# Patient Record
Sex: Female | Born: 1955
Health system: Southern US, Community
[De-identification: ages and names within clinical notes are randomized; demographics above are authoritative.]

## PROBLEM LIST (undated history)

## (undated) DIAGNOSIS — F329 Major depressive disorder, single episode, unspecified: Secondary | ICD-10-CM

## (undated) DIAGNOSIS — F32A Depression, unspecified: Secondary | ICD-10-CM

## (undated) DIAGNOSIS — K219 Gastro-esophageal reflux disease without esophagitis: Secondary | ICD-10-CM

## (undated) DIAGNOSIS — T7840XA Allergy, unspecified, initial encounter: Secondary | ICD-10-CM

## (undated) DIAGNOSIS — E785 Hyperlipidemia, unspecified: Secondary | ICD-10-CM

## (undated) DIAGNOSIS — K5710 Diverticulosis of small intestine without perforation or abscess without bleeding: Secondary | ICD-10-CM

## (undated) DIAGNOSIS — K449 Diaphragmatic hernia without obstruction or gangrene: Secondary | ICD-10-CM

## (undated) HISTORY — DX: Allergy, unspecified, initial encounter: T78.40XA

## (undated) HISTORY — DX: Diverticulosis of small intestine without perforation or abscess without bleeding: K57.10

## (undated) HISTORY — DX: Major depressive disorder, single episode, unspecified: F32.9

## (undated) HISTORY — DX: Hyperlipidemia, unspecified: E78.5

## (undated) HISTORY — DX: Diaphragmatic hernia without obstruction or gangrene: K44.9

## (undated) HISTORY — DX: Gastro-esophageal reflux disease without esophagitis: K21.9

## (undated) HISTORY — PX: TUBAL LIGATION: SHX77

## (undated) HISTORY — DX: Depression, unspecified: F32.A

---

## 1999-10-11 ENCOUNTER — Ambulatory Visit (HOSPITAL_COMMUNITY): Admission: RE | Admit: 1999-10-11 | Discharge: 1999-10-11 | Payer: Self-pay | Admitting: Internal Medicine

## 1999-10-11 ENCOUNTER — Encounter: Payer: Self-pay | Admitting: Internal Medicine

## 2000-11-05 ENCOUNTER — Encounter: Payer: Self-pay | Admitting: Internal Medicine

## 2000-11-05 ENCOUNTER — Ambulatory Visit (HOSPITAL_COMMUNITY): Admission: RE | Admit: 2000-11-05 | Discharge: 2000-11-05 | Payer: Self-pay | Admitting: Internal Medicine

## 2001-07-31 ENCOUNTER — Other Ambulatory Visit: Admission: RE | Admit: 2001-07-31 | Discharge: 2001-07-31 | Payer: Self-pay | Admitting: Internal Medicine

## 2001-11-24 ENCOUNTER — Ambulatory Visit (HOSPITAL_COMMUNITY): Admission: RE | Admit: 2001-11-24 | Discharge: 2001-11-24 | Payer: Self-pay | Admitting: Internal Medicine

## 2001-11-24 ENCOUNTER — Encounter: Payer: Self-pay | Admitting: Internal Medicine

## 2002-11-11 ENCOUNTER — Other Ambulatory Visit: Admission: RE | Admit: 2002-11-11 | Discharge: 2002-11-11 | Payer: Self-pay | Admitting: Internal Medicine

## 2002-12-04 ENCOUNTER — Ambulatory Visit (HOSPITAL_COMMUNITY): Admission: RE | Admit: 2002-12-04 | Discharge: 2002-12-04 | Payer: Self-pay | Admitting: Internal Medicine

## 2002-12-04 ENCOUNTER — Encounter: Payer: Self-pay | Admitting: Internal Medicine

## 2003-12-02 ENCOUNTER — Other Ambulatory Visit: Admission: RE | Admit: 2003-12-02 | Discharge: 2003-12-02 | Payer: Self-pay | Admitting: Internal Medicine

## 2003-12-13 ENCOUNTER — Ambulatory Visit (HOSPITAL_COMMUNITY): Admission: RE | Admit: 2003-12-13 | Discharge: 2003-12-13 | Payer: Self-pay | Admitting: Internal Medicine

## 2005-01-08 ENCOUNTER — Other Ambulatory Visit: Admission: RE | Admit: 2005-01-08 | Discharge: 2005-01-08 | Payer: Self-pay | Admitting: Internal Medicine

## 2005-01-10 ENCOUNTER — Ambulatory Visit (HOSPITAL_COMMUNITY): Admission: RE | Admit: 2005-01-10 | Discharge: 2005-01-10 | Payer: Self-pay | Admitting: Internal Medicine

## 2006-01-22 ENCOUNTER — Other Ambulatory Visit: Admission: RE | Admit: 2006-01-22 | Discharge: 2006-01-22 | Payer: Self-pay | Admitting: Internal Medicine

## 2006-03-15 ENCOUNTER — Ambulatory Visit (HOSPITAL_COMMUNITY): Admission: RE | Admit: 2006-03-15 | Discharge: 2006-03-15 | Payer: Self-pay | Admitting: Internal Medicine

## 2007-01-29 ENCOUNTER — Other Ambulatory Visit: Admission: RE | Admit: 2007-01-29 | Discharge: 2007-01-29 | Payer: Self-pay | Admitting: Gynecology

## 2007-02-14 ENCOUNTER — Encounter: Admission: RE | Admit: 2007-02-14 | Discharge: 2007-02-14 | Payer: Self-pay | Admitting: Internal Medicine

## 2007-03-17 ENCOUNTER — Ambulatory Visit (HOSPITAL_COMMUNITY): Admission: RE | Admit: 2007-03-17 | Discharge: 2007-03-17 | Payer: Self-pay | Admitting: Internal Medicine

## 2007-08-25 ENCOUNTER — Ambulatory Visit: Payer: Self-pay | Admitting: Gastroenterology

## 2007-09-08 ENCOUNTER — Ambulatory Visit: Payer: Self-pay | Admitting: Gastroenterology

## 2008-05-03 ENCOUNTER — Ambulatory Visit (HOSPITAL_COMMUNITY): Admission: RE | Admit: 2008-05-03 | Discharge: 2008-05-03 | Payer: Self-pay | Admitting: Family Medicine

## 2008-06-01 LAB — HM COLONOSCOPY: HM Colonoscopy: NORMAL

## 2008-12-08 ENCOUNTER — Ambulatory Visit: Payer: Self-pay | Admitting: Internal Medicine

## 2008-12-08 DIAGNOSIS — K219 Gastro-esophageal reflux disease without esophagitis: Secondary | ICD-10-CM | POA: Insufficient documentation

## 2008-12-08 DIAGNOSIS — M810 Age-related osteoporosis without current pathological fracture: Secondary | ICD-10-CM | POA: Insufficient documentation

## 2008-12-08 DIAGNOSIS — F329 Major depressive disorder, single episode, unspecified: Secondary | ICD-10-CM | POA: Insufficient documentation

## 2008-12-08 DIAGNOSIS — J45909 Unspecified asthma, uncomplicated: Secondary | ICD-10-CM | POA: Insufficient documentation

## 2008-12-08 LAB — HM DEXA SCAN

## 2008-12-24 ENCOUNTER — Encounter: Payer: Self-pay | Admitting: Internal Medicine

## 2009-01-03 ENCOUNTER — Ambulatory Visit: Payer: Self-pay | Admitting: Internal Medicine

## 2009-01-03 LAB — CONVERTED CEMR LAB
ALT: 15 units/L (ref 0–35)
AST: 18 units/L (ref 0–37)
Albumin: 3.7 g/dL (ref 3.5–5.2)
Alkaline Phosphatase: 68 units/L (ref 39–117)
BUN: 15 mg/dL (ref 6–23)
Basophils Absolute: 0 10*3/uL (ref 0.0–0.1)
Basophils Relative: 0.6 % (ref 0.0–3.0)
Bilirubin, Direct: 0.1 mg/dL (ref 0.0–0.3)
CO2: 28 meq/L (ref 19–32)
Calcium: 9 mg/dL (ref 8.4–10.5)
Chloride: 109 meq/L (ref 96–112)
Cholesterol: 219 mg/dL — ABNORMAL HIGH (ref 0–200)
Creatinine, Ser: 0.6 mg/dL (ref 0.4–1.2)
Direct LDL: 126.5 mg/dL
Eosinophils Absolute: 0.2 10*3/uL (ref 0.0–0.7)
Eosinophils Relative: 4.6 % (ref 0.0–5.0)
GFR calc non Af Amer: 111.46 mL/min (ref >60)
Glucose, Bld: 98 mg/dL (ref 70–99)
HCT: 37.7 % (ref 36.0–46.0)
HDL: 48.4 mg/dL (ref >39.00)
Hemoglobin: 12.8 g/dL (ref 12.0–15.0)
Lymphocytes Relative: 37.8 % (ref 12.0–46.0)
Lymphs Abs: 1.2 10*3/uL (ref 0.7–4.0)
MCHC: 33.9 g/dL (ref 30.0–36.0)
MCV: 88.1 fL (ref 78.0–100.0)
Magnesium: 2.3 mg/dL (ref 1.5–2.5)
Monocytes Absolute: 0.3 10*3/uL (ref 0.1–1.0)
Monocytes Relative: 8 % (ref 3.0–12.0)
Neutro Abs: 1.6 10*3/uL (ref 1.4–7.7)
Neutrophils Relative %: 49 % (ref 43.0–77.0)
Phosphorus: 3.9 mg/dL (ref 2.3–4.6)
Platelets: 243 10*3/uL (ref 150.0–400.0)
Potassium: 4.3 meq/L (ref 3.5–5.1)
RBC: 4.27 M/uL (ref 3.87–5.11)
RDW: 12.8 % (ref 11.5–14.6)
Sodium: 143 meq/L (ref 135–145)
T3, Free: 3 pg/mL (ref 2.3–4.2)
TSH: 0.51 microintl units/mL (ref 0.35–5.50)
Total Bilirubin: 0.7 mg/dL (ref 0.3–1.2)
Total CHOL/HDL Ratio: 5
Total Protein: 6.8 g/dL (ref 6.0–8.3)
Triglycerides: 125 mg/dL (ref 0.0–149.0)
VLDL: 25 mg/dL (ref 0.0–40.0)
WBC: 3.3 10*3/uL — ABNORMAL LOW (ref 4.5–10.5)

## 2009-01-14 ENCOUNTER — Ambulatory Visit (HOSPITAL_COMMUNITY): Admission: RE | Admit: 2009-01-14 | Discharge: 2009-01-14 | Payer: Self-pay | Admitting: Internal Medicine

## 2009-02-10 ENCOUNTER — Ambulatory Visit: Payer: Self-pay | Admitting: Internal Medicine

## 2009-05-05 ENCOUNTER — Encounter: Admission: RE | Admit: 2009-05-05 | Discharge: 2009-05-05 | Payer: Self-pay | Admitting: Family Medicine

## 2009-07-21 ENCOUNTER — Encounter: Admission: RE | Admit: 2009-07-21 | Discharge: 2009-07-21 | Payer: Self-pay | Admitting: Orthopedic Surgery

## 2009-07-26 LAB — CONVERTED CEMR LAB: Pap Smear: NORMAL

## 2009-08-19 ENCOUNTER — Ambulatory Visit: Payer: Self-pay | Admitting: Internal Medicine

## 2009-08-19 DIAGNOSIS — J069 Acute upper respiratory infection, unspecified: Secondary | ICD-10-CM | POA: Insufficient documentation

## 2009-10-31 ENCOUNTER — Encounter: Admission: RE | Admit: 2009-10-31 | Discharge: 2009-10-31 | Payer: Self-pay | Admitting: Chiropractic Medicine

## 2010-02-13 ENCOUNTER — Ambulatory Visit: Payer: Self-pay | Admitting: Internal Medicine

## 2010-02-13 DIAGNOSIS — R1084 Generalized abdominal pain: Secondary | ICD-10-CM | POA: Insufficient documentation

## 2010-02-13 DIAGNOSIS — R5381 Other malaise: Secondary | ICD-10-CM | POA: Insufficient documentation

## 2010-02-13 DIAGNOSIS — R5383 Other fatigue: Secondary | ICD-10-CM

## 2010-02-13 LAB — CONVERTED CEMR LAB
ALT: 16 units/L (ref 0–35)
AST: 17 units/L (ref 0–37)
Albumin: 4.5 g/dL (ref 3.5–5.2)
Alkaline Phosphatase: 52 units/L (ref 39–117)
BUN: 15 mg/dL (ref 6–23)
Basophils Absolute: 0 10*3/uL (ref 0.0–0.1)
Basophils Relative: 0 % (ref 0–1)
Bilirubin, Direct: 0.1 mg/dL (ref 0.0–0.3)
CO2: 25 meq/L (ref 19–32)
Calcium: 9.7 mg/dL (ref 8.4–10.5)
Chloride: 105 meq/L (ref 96–112)
Creatinine, Ser: 0.72 mg/dL (ref 0.40–1.20)
Eosinophils Absolute: 0.1 10*3/uL (ref 0.0–0.7)
Eosinophils Relative: 2 % (ref 0–5)
Free T4: 1.19 ng/dL (ref 0.80–1.80)
Glucose, Bld: 106 mg/dL — ABNORMAL HIGH (ref 70–99)
HCT: 39.6 % (ref 36.0–46.0)
Hemoglobin: 12.9 g/dL (ref 12.0–15.0)
Indirect Bilirubin: 0.3 mg/dL (ref 0.0–0.9)
Lymphocytes Relative: 29 % (ref 12–46)
Lymphs Abs: 1.7 10*3/uL (ref 0.7–4.0)
MCHC: 32.6 g/dL (ref 30.0–36.0)
MCV: 89.4 fL (ref 78.0–100.0)
Monocytes Absolute: 0.4 10*3/uL (ref 0.1–1.0)
Monocytes Relative: 7 % (ref 3–12)
Neutro Abs: 3.7 10*3/uL (ref 1.7–7.7)
Neutrophils Relative %: 63 % (ref 43–77)
Platelets: 314 10*3/uL (ref 150–400)
Potassium: 4.8 meq/L (ref 3.5–5.3)
RBC: 4.43 M/uL (ref 3.87–5.11)
RDW: 13.1 % (ref 11.5–15.5)
Sodium: 143 meq/L (ref 135–145)
TSH: 0.716 microintl units/mL (ref 0.350–4.500)
Total Bilirubin: 0.4 mg/dL (ref 0.3–1.2)
Total Protein: 7.4 g/dL (ref 6.0–8.3)
WBC: 5.8 10*3/uL (ref 4.0–10.5)

## 2010-02-14 ENCOUNTER — Encounter: Payer: Self-pay | Admitting: Internal Medicine

## 2010-03-09 ENCOUNTER — Ambulatory Visit: Payer: Self-pay | Admitting: Internal Medicine

## 2010-06-15 ENCOUNTER — Encounter: Payer: Self-pay | Admitting: Internal Medicine

## 2010-06-16 ENCOUNTER — Encounter: Payer: Self-pay | Admitting: Internal Medicine

## 2010-10-01 ENCOUNTER — Encounter: Payer: Self-pay | Admitting: Internal Medicine

## 2010-10-08 LAB — CONVERTED CEMR LAB: Pap Smear: NORMAL

## 2010-10-12 NOTE — Miscellaneous (Signed)
Summary: BONE DENSITY  Clinical Lists Changes  Orders: Added new Test order of T-Bone Densitometry (77080) - Signed Added new Test order of T-Lumbar Vertebral Assessment (77082) - Signed 

## 2010-10-12 NOTE — Letter (Signed)
   [place] at [facility] [address] [place], [place]  [address]  [place] Phone: [phone]      December 24, 2008   [facility] [place] [address] [place], [place] [address]  RE:  LAB RESULTS  Dear  Ms. Mankins,  The following is an interpretation of your most recent lab tests.  Please take note of any instructions provided or changes to medications that have resulted from your lab work.  Your bone density scan confirms osteoporosis.   Please call my office to arrange ReClast infusion (IV bisphosphonate).     Sincerely Yours,    Dr. Thomos Lemons

## 2010-10-12 NOTE — Letter (Signed)
   [place] at [facility] [address] [place], [place]  [address]  [place] Phone: [phone]      February 14, 2010   [facility] [place] [address] [place], [place] [address]  RE:  LAB RESULTS  Dear  Ms. Carley,  The following is an interpretation of your most recent lab tests.  Please take note of any instructions provided or changes to medications that have resulted from your lab work.  ELECTROLYTES:  Good - no changes needed  KIDNEY FUNCTION TESTS:  Good - no changes needed  LIVER FUNCTION TESTS:  Good - no changes needed   THYROID STUDIES:  Thyroid studies normal TSH: 0.716     CBC:  Good - no changes needed       Sincerely Yours,    Dr. Thomos Lemons

## 2010-10-12 NOTE — Assessment & Plan Note (Signed)
Summary: 2 months rov-ch   Vital Signs:  Patient profile:   55 year old female Height:      60 inches Weight:      115 pounds Temp:     98.2 degrees F oral Pulse rate:   661 / minute BP sitting:   120 / 78  (left arm)  Vitals Entered By: Jeremy Johann CMA (February 10, 2009 8:09 AM) CC: 2 month   Primary Care Provider:  Dondra Spry DO  CC:  2 month.  History of Present Illness: 55 y/o Asian female for follow up re:  adj d/o with depression and osteoporosis.  Depression - doing well asymptomatic.     No significant life stressors.     Osteoporosis - received ReClast infusion.   She exp flu like symptoms x 2 days but resolved.    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   Neck:  No deformities, masses, or tenderness noted. Lungs:  normal respiratory effort, normal breath sounds, and no wheezes.   Heart:  normal rate, regular rhythm, no murmur, and no gallop.   Psych:  normally interactive, good eye contact, not anxious appearing, and not depressed appearing.     Impression & Recommendations:  Problem # 1:  DEPRESSION (ICD-311) Pt given instruction to taper lexapro over 1 month.   We discussed using 1/2 of clonazepam. Her updated medication list for this problem includes:    Lexapro 10 Mg Tabs (Escitalopram oxalate) .[name] Kitchen... Take 1 tablet by mouth once a day    Clonazepam 0.5 Mg Tabs (Clonazepam) .[name] Kitchen... Take 1 tablet by mouth once a day as needed  Problem # 2:  OSTEOPOROSIS (ICD-733.00) She received ReClast infusion.  She has flu like symptoms x 3 days but resolved.    Complete Medication List: 1)  Pantoprazole Sodium 40 Mg Tbec (Pantoprazole sodium) .... Take 1 tablet by mouth once a day 2)  Lexapro 10 Mg Tabs (Escitalopram oxalate) ....  Take 1 tablet by mouth once a day 3)  Clonazepam 0.5 Mg Tabs (Clonazepam) .... Take 1 tablet by mouth once a day as needed 4)  Epipen 0.3 Mg/0.24ml (1:1000) Devi (Epinephrine hcl (anaphylaxis)) .... Use as needed 5)  Symbicort 80-4.5 Mcg/act Aero (Budesonide-formoterol fumarate) .... 2 puffs bid  Patient Instructions: 1)  Please schedule a follow-up appointment in 4 months.

## 2010-10-12 NOTE — Assessment & Plan Note (Signed)
Summary: ? Sinus infection, sore throat- jr   Vital Signs:  Patient profile:   55 year old female Weight:      121.50 pounds BMI:     23.81 O2 Sat:      99 % on Room air Temp:     97.9 degrees F oral Pulse rate:   84 / minute Pulse rhythm:   regular Resp:     18 per minute BP sitting:   120 / 80  (right arm) Cuff size:   regular  Vitals Entered By: Glendell Docker CMA (August 19, 2009 8:21 AM)  O2 Flow:  Room air  Primary Care Provider:  D. Thomos Lemons DO  CC:  Medication Follow up and URI symptoms.  History of Present Illness:  URI Symptoms      This is a 55 year old woman who presents with URI symptoms.  The patient reports nasal congestion, clear nasal discharge, sore throat, and earache.  The patient denies fever.  Onset of symptoms 2-3 days.  Anxiety/Depression - hasn't tapered lexapro completely.  she is taking every other day.   uses clonazepam as needed.  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   Ears:  R ear normal and L ear normal.   Mouth:  pharyngeal erythema and postnasal drip.   Neck:  No deformities, masses, or tenderness noted. Lungs:  normal respiratory effort, normal breath sounds, and no wheezes.   Heart:  normal rate, regular rhythm, and no gallop.     Impression & Recommendations:  Problem # 1:  URI (ICD-465.9)  Instructed on symptomatic treatment. Call if symptoms persist or worsen.  Use warm salt water gargle and nasal saline irrigation.   Problem # 2:  DEPRESSION (ICD-311) Assessment: Unchanged stable.  hasn't tapered lexapro completely.  she is taking every other day.   uses clonazepam as needed. Her updated  medication list for this problem includes:    Lexapro 10 Mg Tabs (Escitalopram oxalate) .[name] Kitchen... Take 1 tablet by mouth once a day    Clonazepam 0.5 Mg Tabs (Clonazepam) .[name] Kitchen... Take 1 tablet by mouth once a day as needed  Complete Medication List: 1)  Pantoprazole Sodium 40 Mg Tbec (Pantoprazole sodium) .... Take 1 tablet by mouth once a day 2)  Lexapro 10 Mg Tabs (Escitalopram oxalate) .... Take 1 tablet by mouth once a day 3)  Clonazepam 0.5 Mg Tabs (Clonazepam) .... Take 1 tablet by mouth once a day as needed 4)  Epipen 0.3 Mg/0.50ml (1:1000) Devi (Epinephrine hcl (anaphylaxis)) .... Use as needed 5)  Symbicort 80-4.5 Mcg/act Aero (Budesonide-formoterol fumarate) .... 2 puffs bid  Patient Instructions: 1)  Please schedule a follow-up appointment in 6 months. 2)  Call our office if your symptoms do not  improve or gets worse. 3)  Use Lloyd Huger Med sinus irrigation Prescriptions: CLONAZEPAM 0.5 MG TABS (CLONAZEPAM) Take 1 tablet by mouth once a day as needed  #90 x 1   Entered and Authorized by:   D. Thomos Lemons DO   Signed by:   D. Thomos Lemons DO on 08/19/2009   Method used:   Print then Give to Patient   RxID:   [id number]  Immunization History:  Influenza Immunization History:    Influenza:  historical (07/05/2009)    Preventive Care Screening  Pap Smear:    Date:  07/26/2009    Results:  normal   Last Flu Shot:    Date:  07/05/2009    Results:  Historical   Mammogram:    Date:  06/28/2009    Results:  normal    Current Allergies (reviewed today): No known allergies   Appended Document: Orders Update    Clinical Lists Changes  Orders: Added new Service order of Rapid Strep [phone]) - Signed Observations: Added new observation of RAPID STREP: negative (08/19/2009 12:01)      Laboratory Results    Other Tests  Rapid Strep: negative

## 2010-10-12 NOTE — Assessment & Plan Note (Signed)
Summary: tired cant sleep feels bad/mhf   Vital Signs:  Patient profile:   55 year old female Weight:      117.75 pounds BMI:     23.08 O2 Sat:      100 % on Room air Temp:     97.9 degrees F oral Pulse rate:   76 / minute Pulse rhythm:   regular Resp:     16 per minute BP sitting:   120 / 80  (left arm) Cuff size:   regular  Vitals Entered By: Glendell Docker CMA (February 13, 2010 3:54 PM)  O2 Flow:  Room air CC: Rm 3-  Stress   Primary Care Provider:  Dondra Spry DO  CC:  Rm 3-  Stress.  History of Present Illness: work related stress, trouble falling and staying asleep, dizzy and light headed , hot & cold sweats, concerned with increase in heartburn. She states she was on vacation about 2 weeks ago , she had an episode of bad stomach cramping, which has resolved0  1 benardryl at night and one clonazepam at night  constantly exhausted.  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   Lungs:  normal respiratory effort and normal breath sounds.   Heart:  normal rate, regular rhythm, and no gallop.   Abdomen:  soft, non-tender, and normal bowel sounds.   Psych:  normally interactive, good eye contact, and not depressed appearing.     Impression & Recommendations:  Problem # 1:  ABDOMINAL PAIN (ICD-789.00) abd pain of unclear etiology.  transient gastroenteritis or related to stress rxn.   take otc zantac as needed  Orders: T-Basic Metabolic Panel [id number]) T-Hepatic Function [id number]) T-CBC w/Diff [phone])  Problem # 2:  DEPRESSION (ICD-311) I suspect sleep issues exacerbated by anxiety/ depression.  restart lexapro  The following  medications were removed from the medication list:    Lexapro 10 Mg Tabs (Escitalopram oxalate) .[name] Kitchen... Take 1 tablet by mouth once a day Her updated medication list for this problem includes:    Clonazepam 0.5 Mg Tabs (Clonazepam) .[name] Kitchen... Take 1 tablet by mouth once a day as needed    Lexapro 10 Mg Tabs (Escitalopram oxalate) ..... One half tab by mouth once daily x 7 days, then one by mouth qd  Complete Medication List: 1)  Pantoprazole Sodium 40 Mg Tbec (Pantoprazole sodium) .... Take 1 tablet by mouth once a day 2)  Clonazepam 0.5 Mg Tabs (Clonazepam) .... Take 1 tablet by mouth once a day as needed 3)  Epipen 0.3 Mg/0.25ml (1:1000) Devi (Epinephrine hcl (anaphylaxis)) .... Use as needed 4)  Lexapro 10 Mg Tabs (Escitalopram oxalate) .... One half tab by mouth once daily x 7 days, then one by mouth qd 5)  Hyoscyamine Sulfate 0.125 Mg Tabs (Hyoscyamine sulfate) .... One by mouth three times a day as needed for abdominal cramps  Other Orders: T-TSH ([id number]) T-T4, Free [phone])  Patient Instructions: 1)  Please schedule a follow-up appointment in 1 month. Prescriptions: HYOSCYAMINE SULFATE 0.125 MG TABS (HYOSCYAMINE SULFATE) one by mouth three times a day as needed for abdominal cramps  #30 x 1   Entered and Authorized by:  Dondra Spry DO   Signed by:   D. Thomos Lemons DO on 02/13/2010   Method used:   Electronically to        [facility] (retail)       [address]       [place], [place]  [address]       Ph: [phone] or [phone]       Fax: [phone]   RxID:   [id number] LEXAPRO 10 MG TABS (ESCITALOPRAM OXALATE) one half tab by mouth once daily x 7 days, then one by mouth qd  #30 x 1   Entered and Authorized by:   D. Thomos Lemons DO   Signed by:   D. Thomos Lemons DO on 02/13/2010   Method used:   Samples Given   RxID:   [id number]   Current Allergies (reviewed today): No known allergies

## 2010-10-12 NOTE — Assessment & Plan Note (Signed)
Summary: TO EST/HEA   Vital Signs:  Patient profile:   55 year old female Height:      60 inches Weight:      114.25 pounds BMI:     22.39 Temp:     97.9 degrees F Pulse rate:   68 / minute BP sitting:   112 / 70  (right arm) Cuff size:   regular  Vitals Entered By: Glendell Docker CMA (December 08, 2008 8:53 AM)  Primary Care Bonna Steury:  Kaylee Spry DO   History of Present Illness: New Patient to establish care  55 y/o Asian female to establish primary care.   She has hx of situational depression and anxiety.  Her prev PCP started pt on SSRI since 2004-2005.  She reports stress due to divorse.   She has tried to stopping lexapro but she experienced anxiety.   She is also concerned about wt gain assoc with SSRI.  Hx of GERD - stable on protonix.  Pt drinks one of coffe per day.   She denies dysphagia.      Hx of asthma - stable.   Infrequent flares.  She uses maintenace inhaler regularly.  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   Physical Exam  General:  alert, well-developed, and well-nourished.   Head:  normocephalic and atraumatic.   Eyes:  vision grossly intact, pupils equal, pupils round, and pupils reactive to light.   Ears:  R ear normal and L ear normal.   Mouth:  Oral mucosa and oropharynx without lesions or exudates.  Teeth in good repair. Neck:  No deformities, masses, or tenderness noted. Lungs:  normal  respiratory effort, normal breath sounds, and no wheezes.   Heart:  normal rate, regular rhythm, no murmur, and no gallop.   Abdomen:  soft, non-tender, no hepatomegaly, and no splenomegaly.   Extremities:  No clubbing, cyanosis, edema   Neurologic:  cranial nerves II-XII intact and gait normal.   Psych:  normally interactive, good eye contact, not anxious appearing, and not depressed appearing.     Impression & Recommendations:  Problem # 1:  DEPRESSION (ICD-311) Lexapro started 2004-2005 after divorce.   She notes some wt gain.   We discussed potentially tapering off vs switch to SNRI.  Her updated medication list for this problem includes:    Lexapro 10 Mg Tabs (Escitalopram oxalate) .[name] Kitchen... Take 1 tablet by mouth once a day    Clonazepam 0.5 Mg Tabs (Clonazepam) .[name] Kitchen... Take 1 tablet by mouth once a day as needed  Problem # 2:  ASTHMA (ICD-493.90) Pt with mild asthma.   Continue maintenance inhaler.   Her updated medication list for this problem includes:    Symbicort  80-4.5 Mcg/act Aero (Budesonide-formoterol fumarate) .[name] Kitchen... 2 puffs bid  Problem # 3:  GERD (ICD-530.81) Stable.   Continue PPI.   Her updated medication list for this problem includes:    Pantoprazole Sodium 40 Mg Tbec (Pantoprazole sodium) .[name] Kitchen... Take 1 tablet by mouth once a day  Problem # 4:  OSTEOPOROSIS (ICD-733.00) Pt with hx if osteoporosis.  She was previously of forteo.   Intolerant of oral bisphosphonates due to GERD.   Obtain copy of previous DEXA.   Consider yearly IV ReClast.  Check vit D level.  Complete Medication List: 1)  Pantoprazole Sodium 40 Mg Tbec (Pantoprazole sodium) .... Take 1 tablet by mouth once a day 2)  Lexapro 10 Mg Tabs (Escitalopram oxalate) .... Take 1 tablet by mouth once a day 3)  Clonazepam 0.5 Mg Tabs (Clonazepam) .... Take 1 tablet by mouth once a day as needed 4)  Epipen 0.3 Mg/0.29ml (1:1000) Devi (Epinephrine hcl (anaphylaxis)) .... Use as needed 5)  Symbicort 80-4.5 Mcg/act  Aero (Budesonide-formoterol fumarate) .... 2 puffs bid  Patient Instructions: 1)  Please schedule a follow-up appointment in 2 months. 2)  BMP prior to visit, ICD-9: 311 3)  Hepatic Panel prior to visit, ICD-9: 311 4)  Lipid Panel prior to visit, ICD-9: 311 5)  TSH prior to visit, ICD-9: 311 6)  Free T4:  311 7)  CBC w/ Diff prior to visit, ICD-9: 311 8)  Vit D level:  733.00 9)  Please return for lab work one (1) week before your next appointment.  10)  Schedule DEXA at [facility] before next OV. Prescriptions: CLONAZEPAM 0.5 MG TABS (CLONAZEPAM) Take 1 tablet by mouth once a day as needed  #30 x 3   Entered and Authorized by:   D. Thomos Lemons DO   Signed by:   D. Thomos Lemons DO on 12/08/2008   Method used:   Print then Give to Patient   RxID:   [id number] LEXAPRO 10 MG TABS (ESCITALOPRAM OXALATE) Take 1 tablet by mouth once a day  #90 x 3   Entered and Authorized by:   D. Thomos Lemons DO   Signed by:   D. Thomos Lemons DO on 12/08/2008   Method used:   Electronically to        [facility]* (mail-order)             ,          Ph: [phone]       Fax: [phone]   RxID:   [id number] PANTOPRAZOLE SODIUM 40 MG TBEC (PANTOPRAZOLE SODIUM) Take 1 tablet by mouth once a day  #90 x 3   Entered and Authorized by:   D. Thomos Lemons DO   Signed by:   D. Thomos Lemons DO on 12/08/2008   Method used:   Electronically to        [facility]* (mail-order)             ,          Ph: [phone]       Fax: [phone]   RxID:   [id number] SYMBICORT 80-4.5 MCG/ACT AERO (BUDESONIDE-FORMOTEROL FUMARATE) 2 puffs bid  #3 x 3   Entered and Authorized by:   D. Thomos Lemons DO   Signed by:   D. Thomos Lemons DO on 12/08/2008   Method used:   Electronically to        [facility])             ,  Ph: [phone]       Fax: [phone]   RxID:   [id number]       Preventive Care Screening  Last Tetanus Booster:    Date:  06/07/2008    Results:  Tdap    Colonoscopy:    Date:  06/01/2008    Results:  normal   Pap Smear:    Date:  05/07/2008    Results:  normal   Mammogram:    Date:  05/03/2008    Results:  normal   Bone Density:    Date:  11/26/2005    Results:  abnormal std dev   Preventive Care Screening  Last Tetanus Booster:    Date:  06/07/2008    Results:  Tdap   Colonoscopy:    Date:  06/01/2008    Results:  normal   Pap Smear:    Date:  05/07/2008    Results:  normal   Mammogram:    Date:  05/03/2008    Results:  normal   Bone Density:    Date:  11/26/2005    Results:  abnormal std dev

## 2010-10-12 NOTE — Assessment & Plan Note (Signed)
Summary: 1 month f/u / tf,cma rsc with pt from bump/mhf   Vital Signs:  Patient profile:   55 year old female Weight:      115.75 pounds BMI:     22.69 O2 Sat:      99 % on Room air Temp:     98.0 degrees F oral Pulse rate:   69 / minute Pulse rhythm:   regular Resp:     16 per minute BP sitting:   104 / 70  (right arm) Cuff size:   regular  Vitals Entered By: Glendell Docker CMA (March 09, 2010 8:18 AM)  O2 Flow:  Room air CC: Rm 3- 1 Month follow up Is Patient Diabetic? No Comments no concerns, discuss cholesterol check, refill on Clonazepam and Lexapro, medications reviewed   Primary Care Provider:  DThomos Lemons DO  CC:  Rm 3- 1 Month follow up.  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   Lungs:  normal respiratory effort and normal breath sounds.   Heart:  normal rate, regular rhythm, and no gallop.   Psych:  normally interactive, good eye contact, not anxious appearing, and not depressed appearing.     Impression & Recommendations:  Problem # 1:  DEPRESSION (ICD-311) Assessment Improved  Her updated medication list for this problem includes:    Clonazepam 0.5 Mg Tabs (Clonazepam) .[name] Kitchen... Take 1 tablet by mouth once a day as needed    Lexapro 10 Mg Tabs (Escitalopram oxalate) ..... One by mouth once daily  Complete Medication List: 1)  Pantoprazole Sodium 40 Mg Tbec (Pantoprazole sodium) .... Take 1 tablet by mouth once a day 2)  Clonazepam 0.5 Mg Tabs (Clonazepam) .... Take 1 tablet by mouth once a day as needed 3)  Epipen 0.3 Mg/0.26ml (1:1000) Devi (Epinephrine hcl (anaphylaxis)) .... Use  as needed 4)  Lexapro 10 Mg Tabs (Escitalopram oxalate) .... One by mouth once daily 5)  Hyoscyamine Sulfate 0.125 Mg Tabs (Hyoscyamine sulfate) .... One by mouth three times a day as needed for abdominal cramps  Patient Instructions: 1)  Please schedule a follow-up appointment in 6 months. Prescriptions: PANTOPRAZOLE SODIUM 40 MG TBEC (PANTOPRAZOLE SODIUM) Take 1 tablet by mouth once a day  #90 x 1   Entered and Authorized by:   D. Thomos Lemons DO   Signed by:   D. Thomos Lemons DO on 03/09/2010   Method used:   Electronically to        [facility]* (retail)             ,          Ph: [phone]       Fax: [phone]   RxID:   [id number] CLONAZEPAM 0.5 MG TABS (CLONAZEPAM) Take 1 tablet by mouth once a day as needed  #90 x 1   Entered and Authorized by:   D. Thomos Lemons DO   Signed by:   D. Thomos Lemons DO on 03/09/2010   Method used:   Print then Give to Patient   RxID:   [id number] LEXAPRO 10 MG TABS (ESCITALOPRAM OXALATE) one by mouth once daily  #  90 x 1   Entered and Authorized by:   D. Thomos Lemons DO   Signed by:   D. Thomos Lemons DO on 03/09/2010   Method used:   Electronically to        [facility]* (retail)             ,          Ph: [phone]       Fax: [phone]   RxID:   [id number]   Current Allergies (reviewed today): No known allergies

## 2010-10-12 NOTE — Miscellaneous (Signed)
Summary: mammogram  Clinical Lists Changes  Observations: Added new observation of MAMMOGRAM: normal (06/15/2010 13:22)      Preventive Care Screening  Mammogram:    Date:  06/15/2010    Results:  normal

## 2010-10-12 NOTE — Letter (Signed)
   [place] at [facility] [address] [place], [place]  [address]  [place] Phone: [phone]      January 03, 2009   [facility] [address] [place], [place] [address]  RE:  LAB RESULTS  Dear  [name],  The following is an interpretation of your most recent lab tests.  Please take note of any instructions provided or changes to medications that have resulted from your lab work.  ELECTROLYTES:  Good - no changes needed  KIDNEY FUNCTION TESTS:  Good - no changes needed  LIPID PANEL:  Fair - review at your next visit Triglyceride: 125.0   Cholesterol: 219   HDL: 48.40   Chol/HDL%:  5  THYROID STUDIES:  Thyroid studies normal TSH: 0.51     CBC:  Stable - no changes needed   I will further discuss your lab results at your next follow up appointment.     Sincerely Yours,    Dr. Thomos Lemons

## 2010-10-20 ENCOUNTER — Telehealth: Payer: Self-pay | Admitting: Internal Medicine

## 2010-10-20 ENCOUNTER — Encounter: Payer: Self-pay | Admitting: Internal Medicine

## 2010-10-20 LAB — CONVERTED CEMR LAB
BUN: 11 mg/dL (ref 6–23)
Bacteria, UA: NONE SEEN
Basophils Absolute: 0 10*3/uL (ref 0.0–0.1)
Basophils Relative: 0 % (ref 0–1)
Bilirubin Urine: NEGATIVE
Blood, UA: NEGATIVE
CO2: 26 meq/L (ref 19–32)
CRP: 1.7 mg/dL — ABNORMAL HIGH (ref <0.6)
Calcium: 9.8 mg/dL (ref 8.4–10.5)
Casts: NONE SEEN /lpf
Chloride: 100 meq/L (ref 96–112)
Cholesterol: 237 mg/dL — ABNORMAL HIGH (ref 0–200)
Creatinine, Ser: 0.68 mg/dL (ref 0.40–1.20)
Crystals: NONE SEEN
Eosinophils Absolute: 0.2 10*3/uL (ref 0.0–0.7)
Eosinophils Relative: 4 % (ref 0–5)
Glucose, Bld: 94 mg/dL (ref 70–99)
HCT: 41.5 % (ref 36.0–46.0)
HDL: 60 mg/dL (ref >39)
Hemoglobin: 13.4 g/dL (ref 12.0–15.0)
Ketones, ur: NEGATIVE mg/dL
LDL Cholesterol: 143 mg/dL — ABNORMAL HIGH (ref 0–99)
Lymphocytes Relative: 29 % (ref 12–46)
Lymphs Abs: 1.5 10*3/uL (ref 0.7–4.0)
MCHC: 32.3 g/dL (ref 30.0–36.0)
MCV: 91.2 fL (ref 78.0–100.0)
Monocytes Absolute: 0.4 10*3/uL (ref 0.1–1.0)
Monocytes Relative: 8 % (ref 3–12)
Neutro Abs: 3.1 10*3/uL (ref 1.7–7.7)
Neutrophils Relative %: 60 % (ref 43–77)
Nitrite: NEGATIVE
Platelets: 277 10*3/uL (ref 150–400)
Potassium: 4.5 meq/L (ref 3.5–5.3)
Protein, ur: NEGATIVE mg/dL
RBC: 4.55 M/uL (ref 3.87–5.11)
RDW: 13.3 % (ref 11.5–15.5)
Sodium: 137 meq/L (ref 135–145)
Specific Gravity, Urine: 1.019 (ref 1.005–1.030)
Squamous Epithelial / LPF: NONE SEEN /lpf
TSH: 1.084 microintl units/mL (ref 0.350–4.500)
Total CHOL/HDL Ratio: 4
Triglycerides: 170 mg/dL — ABNORMAL HIGH (ref <150)
Urine Glucose: NEGATIVE mg/dL
Urobilinogen, UA: 0.2 (ref 0.0–1.0)
VLDL: 34 mg/dL (ref 0–40)
WBC: 5.2 10*3/uL (ref 4.0–10.5)
pH: 7.5 (ref 5.0–8.0)

## 2010-10-23 ENCOUNTER — Telehealth: Payer: Self-pay | Admitting: Internal Medicine

## 2010-10-26 ENCOUNTER — Encounter: Payer: Self-pay | Admitting: Internal Medicine

## 2010-10-26 ENCOUNTER — Encounter (INDEPENDENT_AMBULATORY_CARE_PROVIDER_SITE_OTHER): Payer: 59 | Admitting: Internal Medicine

## 2010-10-26 DIAGNOSIS — Z0000 Encounter for general adult medical examination without abnormal findings: Secondary | ICD-10-CM

## 2010-10-26 NOTE — Progress Notes (Signed)
Summary: need lab order  Phone Note Other Incoming   Caller: Katrina @ [facility] ext [phone] Summary of Call: Pt in lab now for blood work. Please advise what labs pt needs. Initial call taken by: Mervin Kung CMA Duncan Dull),  October 20, 2010 9:11 AM  Follow-up for Phone Call        BMP prior to visit, ICD-9:  v70 Lipid Panel prior to visit, ICD-9:  v70 TSH prior to visit, ICD-9: v70 High sensitivity CRP :  v70 cbc:  v70    Follow-up by: D. Thomos Lemons DO,  October 20, 2010 9:22 AM  Additional Follow-up for Phone Call Additional follow up Details #1::        patient presented to office very upset regarding her lab orders and appointment for physical exam.   She was provided the lab order to take to lab Additional Follow-up by: Glendell Docker CMA,  October 20, 2010 9:28 AM

## 2010-10-26 NOTE — Miscellaneous (Signed)
Summary: Orders Update  Clinical Lists Changes  Orders: Added new Test order of T-Urinalysis ([id number]) - Signed 

## 2010-11-01 NOTE — Progress Notes (Signed)
Summary: Lab Results  ---- Converted from flag ---- ---- 10/23/2010 8:51 AM, D. Thomos Lemons DO wrote: call pt  - is she having any urinary symptoms ------------------------------  Phone Note Outgoing Call   Call placed by: Glendell Docker CMA,  October 23, 2010 2:50 PM Call placed to: Patient Summary of Call: call placed to patient at [phone], she states that she does not  have any urinary problems. She is feeling fine. She is scheduled for CPX on Thursdy 10/26/2010. Initial call taken by: Glendell Docker CMA,  October 23, 2010 2:51 PM

## 2010-11-09 ENCOUNTER — Encounter: Payer: Self-pay | Admitting: Internal Medicine

## 2010-11-09 ENCOUNTER — Ambulatory Visit (HOSPITAL_COMMUNITY): Payer: 59 | Attending: Internal Medicine

## 2010-11-09 DIAGNOSIS — M810 Age-related osteoporosis without current pathological fracture: Secondary | ICD-10-CM | POA: Insufficient documentation

## 2010-11-16 NOTE — Assessment & Plan Note (Signed)
Summary: CPX/HEA   Vital Signs:  Patient profile:   55 year old female Menstrual status:  postmenopausal Height:      60 inches Weight:      124.25 pounds BMI:     24.35 O2 Sat:      99 % on Room air Temp:     97.8 degrees F oral Pulse rate:   87 / minute Resp:     18 per minute BP sitting:   110 / 80  (right arm) Cuff size:   regular  Vitals Entered By: Glendell Docker CMA (October 26, 2010 10:47 AM)  O2 Flow:  Room air CC: CPX Is Patient Diabetic? No Pain Assessment Patient in pain? no      Comments medication refills     Menstrual Status postmenopausal Last PAP Result normal   Primary Care Provider:  Dondra Spry DO  CC:  CPX.  History of Present Illness: 55 y/o female for routine cpx  FLU VAX          Every 12 months         07/05/2009  Historical Due Now  TD BOOSTER       Every 10 years          06/07/2008  Tdap       Due On: 06/07/2018  ZOSTAVAX         At Age 38 years                                Due On: 09/25/2016  COLONOSCOPY      Every 10 years          06/01/2008  normal     Due On: 06/01/2018  COLONNXTDUE      At Age 87 years                                Due Now  FLEX SIGMOID     Every 5 years                                  Due Now  HEMOCCULT        Every 12 months                                Due Now  MAMMOGRAM        Every 12 months         06/15/2010  normal     Due On: 06/16/2011  MAMMO DUE        At Age 87 years                                Due Now  PAP SMEAR        Every 12 months         07/26/2009  normal     Due Now  PAP DUE          At Age 87 years                                Due Now  CHOLESTEROL  Every 5 years           10/20/2010  237        Due On: 10/21/2015  pilates 2-4 x per week   GERD - taking protonix regularly  Preventive Screening-Counseling & Management  Alcohol-Tobacco     Alcohol drinks/day: 0     Smoking Status: never  Caffeine-Diet-Exercise     Caffeine use/day: 1 beverage daily     Does Patient  Exercise: yes     Times/week: 4  Allergies (verified): No Known Drug Allergies  Past History:  Past Medical History: Osteoporosis Asthma Depression    GERD      Family History: parents in early 29s - AW prostate ca - father mother - knee no early CAD, CVA no cancer  Social History: Caffeine use/day:  1 beverage daily  Review of Systems       The patient complains of weight gain.  The patient denies chest pain, syncope, dyspnea on exertion, abdominal pain, melena, hematochezia, severe indigestion/heartburn, and depression.         volunteering  (eating more fried chicken)  Physical Exam  General:  alert, well-developed, and well-nourished.   Head:  normocephalic and atraumatic.   Eyes:  pupils equal, pupils round, and pupils reactive to light.   Ears:  R ear normal and L ear normal.   Mouth:  pharynx pink and moist.   Neck:  No deformities, masses, or tenderness noted. Lungs:  normal respiratory effort and normal breath sounds.   Heart:  normal rate, regular rhythm, and no gallop.   Abdomen:  soft, non-tender, and normal bowel sounds.   Neurologic:  cranial nerves II-XII intact and gait normal.   Psych:  normally interactive, good eye contact, not anxious appearing, and not depressed appearing.     Impression & Recommendations:  Problem # 1:  HEALTH MAINTENANCE EXAM (ICD-V70.0) Reviewed adult health maintenance protocols. Pt counseled on diet and exercise.. low cholesterol diet handout provided  Orders: EKG w/ Interpretation (93000)  Mammogram: normal (06/15/2010) Pap smear: normal (07/26/2009) Colonoscopy: normal (06/01/2008) Bone Density: abnormal (11/26/2005) Td Booster: Tdap (06/07/2008)   Flu Vax: Historical (07/25/2010)   Chol: 237 (10/20/2010)   HDL: 60 (10/20/2010)   LDL: 143 (10/20/2010)   TG: 170 (10/20/2010) TSH: 1.084 (10/20/2010)     Problem # 2:  OSTEOPOROSIS (ICD-733.00)  Orders: Misc. Referral (Misc. Ref)  Problem # 3:  DEPRESSION  (ICD-311) Assessment: Unchanged  Her updated medication list for this problem includes:    Clonazepam 0.5 Mg Tabs (Clonazepam) .[name] Kitchen... Take 1 tablet by mouth once a day as needed    Lexapro 10 Mg Tabs (Escitalopram oxalate) ..... One by mouth once daily  Complete Medication List: 1)  Pantoprazole Sodium 40 Mg Tbec (Pantoprazole sodium) .... Take 1 tablet by mouth once a day 2)  Clonazepam 0.5 Mg Tabs (Clonazepam) .... Take 1 tablet by mouth once a day as needed 3)  Epipen 0.3 Mg/0.39ml (1:1000) Devi (Epinephrine hcl (anaphylaxis)) .... Use as needed 4)  Lexapro 10 Mg Tabs (Escitalopram oxalate) .... One by mouth once daily 5)  Hyoscyamine Sulfate 0.125 Mg Tabs (Hyoscyamine sulfate) .... One by mouth three times a day as needed for abdominal cramps  Patient Instructions: 1)  Please schedule a follow-up appointment in 6 months. 2)  Lipid Panel prior to visit, ICD-9:  272.4 3)  Please return for lab work one (1) week before your next appointment.  Prescriptions: PANTOPRAZOLE SODIUM 40 MG TBEC (PANTOPRAZOLE SODIUM) Take 1 tablet  by mouth once a day  #90 x 1   Entered and Authorized by:   D. Thomos Lemons DO   Signed by:   D. Thomos Lemons DO on 10/26/2010   Method used:   Electronically to        [facility]* (retail)             ,          Ph: [phone]       Fax: [phone]   RxID:   [id number] LEXAPRO 10 MG TABS (ESCITALOPRAM OXALATE) one by mouth once daily  #90 x 1   Entered and Authorized by:   D. Thomos Lemons DO   Signed by:   D. Thomos Lemons DO on 10/26/2010   Method used:   Electronically to        [facility]* (retail)             ,          Ph: [phone]       Fax: [phone]   RxID:   [id number] CLONAZEPAM 0.5 MG TABS (CLONAZEPAM) Take 1 tablet by mouth once a day as needed  #90 x 1   Entered and Authorized by:   D. Thomos Lemons DO   Signed by:   D. Thomos Lemons DO on 10/26/2010   Method used:   Print then Give to Patient   RxID:    [id number]    Orders Added: 1)  EKG w/ Interpretation [93000] 2)  Misc. Referral [Misc. Ref] 3)  Est. Patient 40-64 years [99396]   Immunization History:  Influenza Immunization History:    Influenza:  historical (07/25/2010)   Immunization History:  Influenza Immunization History:    Influenza:  Historical (07/25/2010)  Current Allergies (reviewed today): No known allergies

## 2010-11-21 NOTE — Op Note (Signed)
Summary: [facility]  [facility]   Imported By: Maryln Gottron 11/17/2010 15:35:17  _____________________________________________________________________  External Attachment:    Type:   Image     Comment:   External Document

## 2011-04-16 ENCOUNTER — Ambulatory Visit (INDEPENDENT_AMBULATORY_CARE_PROVIDER_SITE_OTHER): Payer: 59 | Admitting: Internal Medicine

## 2011-04-16 ENCOUNTER — Encounter: Payer: Self-pay | Admitting: Internal Medicine

## 2011-04-16 VITALS — BP 116/82 | HR 77 | Temp 98.1°F | Resp 16 | Ht 60.0 in | Wt 125.0 lb

## 2011-04-16 DIAGNOSIS — R5381 Other malaise: Secondary | ICD-10-CM

## 2011-04-16 DIAGNOSIS — R5383 Other fatigue: Secondary | ICD-10-CM

## 2011-04-16 DIAGNOSIS — G4700 Insomnia, unspecified: Secondary | ICD-10-CM

## 2011-04-16 DIAGNOSIS — R519 Headache, unspecified: Secondary | ICD-10-CM | POA: Insufficient documentation

## 2011-04-16 DIAGNOSIS — R51 Headache: Secondary | ICD-10-CM

## 2011-04-16 LAB — BASIC METABOLIC PANEL
BUN: 14 mg/dL (ref 6–23)
CO2: 28 mEq/L (ref 19–32)
Calcium: 9.5 mg/dL (ref 8.4–10.5)
Chloride: 105 mEq/L (ref 96–112)
Creat: 0.62 mg/dL (ref 0.50–1.10)
Glucose, Bld: 72 mg/dL (ref 70–99)
Potassium: 4.4 mEq/L (ref 3.5–5.3)
Sodium: 142 mEq/L (ref 135–145)

## 2011-04-16 LAB — HEPATIC FUNCTION PANEL
ALT: 15 U/L (ref 0–35)
AST: 19 U/L (ref 0–37)
Albumin: 4.3 g/dL (ref 3.5–5.2)
Alkaline Phosphatase: 54 U/L (ref 39–117)
Bilirubin, Direct: <0.1 mg/dL (ref 0.0–0.3)
Total Bilirubin: 0.3 mg/dL (ref 0.3–1.2)
Total Protein: 7.2 g/dL (ref 6.0–8.3)

## 2011-04-16 LAB — CBC WITH DIFFERENTIAL/PLATELET
Basophils Absolute: 0 10*3/uL (ref 0.0–0.1)
Basophils Relative: 1 % (ref 0–1)
Eosinophils Absolute: 0.1 10*3/uL (ref 0.0–0.7)
Eosinophils Relative: 3 % (ref 0–5)
HCT: 38.4 % (ref 36.0–46.0)
Hemoglobin: 12.7 g/dL (ref 12.0–15.0)
Lymphocytes Relative: 41 % (ref 12–46)
Lymphs Abs: 2.1 10*3/uL (ref 0.7–4.0)
MCH: 29.2 pg (ref 26.0–34.0)
MCHC: 33.1 g/dL (ref 30.0–36.0)
MCV: 88.3 fL (ref 78.0–100.0)
Monocytes Absolute: 0.4 10*3/uL (ref 0.1–1.0)
Monocytes Relative: 8 % (ref 3–12)
Neutro Abs: 2.5 10*3/uL (ref 1.7–7.7)
Neutrophils Relative %: 49 % (ref 43–77)
Platelets: 305 10*3/uL (ref 150–400)
RBC: 4.35 MIL/uL (ref 3.87–5.11)
RDW: 13 % (ref 11.5–15.5)
WBC: 5.2 10*3/uL (ref 4.0–10.5)

## 2011-04-16 LAB — TSH: TSH: 1.132 u[IU]/mL (ref 0.350–4.500)

## 2011-04-16 LAB — SEDIMENTATION RATE: Sed Rate: 36 mm/hr — ABNORMAL HIGH (ref 0–22)

## 2011-04-16 MED ORDER — ISOMETHEPTENE-APAP-DICHLORAL 65-325-100 MG PO CAPS: 1.0000 | ORAL_CAPSULE | ORAL | Status: DC | PRN

## 2011-04-16 MED ORDER — CLONAZEPAM 0.5 MG PO TABS: 0.5000 mg | ORAL_TABLET | Freq: Every evening | ORAL | Status: DC | PRN

## 2011-04-16 MED ORDER — KETOROLAC TROMETHAMINE 30 MG/ML IJ SOLN
30.0000 mg | Freq: Once | INTRAMUSCULAR | Status: AC
  Administered 2011-04-16: 30 mg via INTRAMUSCULAR

## 2011-04-16 NOTE — Assessment & Plan Note (Signed)
rf klonopin for prn sparing use. Aware of potential for addiction and/or tolerance

## 2011-04-16 NOTE — Assessment & Plan Note (Signed)
Obtain cbc, chem7, lft, tsh and esr.

## 2011-04-16 NOTE — Assessment & Plan Note (Signed)
Neurologically nonfocal. Given toradol 30mg  im. Begin midrin prn. followup if no improvement or worsening.

## 2011-04-16 NOTE — Progress Notes (Signed)
  Subjective:    Patient ID: Kaylee Pratt, female    DOB: [birth year], 55 y.o.   MRN: [id number]  HPI Pt presents to clinic for evaluation of headache. Notes 2wk h/o diffuse headache with nausea but no emesis. Denies neurologic sx such as numbness, weakness, visual disturbance.  Notes accompanying generalized fatigue and insomnia. Takes klonopin qhs prn without evidence of addiction or tolerance. Reviewed appropriate sleep hygiene. Has increased work stress but sx's were no better on vacation. No exacerbating or alleviating factors. No other complaints.  Reviewed pmh, medications and allergies.    Review of Systems see hpi     Objective:   Physical Exam  Nursing note and vitals reviewed. Constitutional: She appears well-developed and well-nourished. No distress.  HENT:  Head: Normocephalic and atraumatic.  Right Ear: External ear normal.  Left Ear: External ear normal.  Eyes: Conjunctivae and EOM are normal. Pupils are equal, round, and reactive to light.  Neck: Neck supple.  Neurological: She is alert. No cranial nerve deficit. Coordination normal.  Skin: Skin is warm and dry. She is not diaphoretic.  Psychiatric: She has a normal mood and affect.          Assessment & Plan:

## 2011-06-11 LAB — HM MAMMOGRAPHY: HM Mammogram: NORMAL

## 2011-07-17 ENCOUNTER — Encounter: Payer: Self-pay | Admitting: Internal Medicine

## 2011-07-30 ENCOUNTER — Other Ambulatory Visit: Payer: Self-pay | Admitting: Internal Medicine

## 2011-08-21 ENCOUNTER — Other Ambulatory Visit: Payer: Self-pay | Admitting: Internal Medicine

## 2011-08-22 ENCOUNTER — Other Ambulatory Visit: Payer: Self-pay | Admitting: Internal Medicine

## 2011-11-02 ENCOUNTER — Ambulatory Visit (INDEPENDENT_AMBULATORY_CARE_PROVIDER_SITE_OTHER): Payer: 59 | Admitting: Family

## 2011-11-02 ENCOUNTER — Other Ambulatory Visit (HOSPITAL_COMMUNITY)
Admission: RE | Admit: 2011-11-02 | Discharge: 2011-11-02 | Disposition: A | Discharge status: Home / Self Care | Payer: 59 | Source: Ambulatory Visit | Attending: Family | Admitting: Family

## 2011-11-02 ENCOUNTER — Telehealth: Payer: Self-pay | Admitting: *Deleted

## 2011-11-02 ENCOUNTER — Other Ambulatory Visit: Payer: Self-pay | Admitting: *Deleted

## 2011-11-02 ENCOUNTER — Ambulatory Visit (INDEPENDENT_AMBULATORY_CARE_PROVIDER_SITE_OTHER)
Admission: RE | Admit: 2011-11-02 | Discharge: 2011-11-02 | Disposition: A | Discharge status: Home / Self Care | Payer: 59 | Source: Ambulatory Visit

## 2011-11-02 ENCOUNTER — Encounter: Payer: Self-pay | Admitting: Family

## 2011-11-02 DIAGNOSIS — M810 Age-related osteoporosis without current pathological fracture: Secondary | ICD-10-CM

## 2011-11-02 DIAGNOSIS — Z01419 Encounter for gynecological examination (general) (routine) without abnormal findings: Secondary | ICD-10-CM | POA: Insufficient documentation

## 2011-11-02 DIAGNOSIS — Z0000 Encounter for general adult medical examination without abnormal findings: Secondary | ICD-10-CM

## 2011-11-02 DIAGNOSIS — N393 Stress incontinence (female) (male): Secondary | ICD-10-CM

## 2011-11-02 LAB — CBC WITH DIFFERENTIAL/PLATELET
Basophils Absolute: 0 10*3/uL (ref 0.0–0.1)
Basophils Relative: 0 % (ref 0–1)
Eosinophils Absolute: 0.1 10*3/uL (ref 0.0–0.7)
Eosinophils Relative: 3 % (ref 0–5)
HCT: 40.1 % (ref 36.0–46.0)
Hemoglobin: 12.8 g/dL (ref 12.0–15.0)
Lymphocytes Relative: 41 % (ref 12–46)
Lymphs Abs: 1.7 10*3/uL (ref 0.7–4.0)
MCH: 29 pg (ref 26.0–34.0)
MCHC: 31.9 g/dL (ref 30.0–36.0)
MCV: 90.9 fL (ref 78.0–100.0)
Monocytes Absolute: 0.4 10*3/uL (ref 0.1–1.0)
Monocytes Relative: 8 % (ref 3–12)
Neutro Abs: 2 10*3/uL (ref 1.7–7.7)
Neutrophils Relative %: 47 % (ref 43–77)
Platelets: 293 10*3/uL (ref 150–400)
RBC: 4.41 MIL/uL (ref 3.87–5.11)
RDW: 12.8 % (ref 11.5–15.5)
WBC: 4.2 10*3/uL (ref 4.0–10.5)

## 2011-11-02 LAB — BASIC METABOLIC PANEL WITH GFR
BUN: 13 mg/dL (ref 6–23)
CO2: 28 mEq/L (ref 19–32)
Calcium: 9.6 mg/dL (ref 8.4–10.5)
Chloride: 104 mEq/L (ref 96–112)
Creat: 0.68 mg/dL (ref 0.50–1.10)
GFR, Est African American: >89 mL/min
GFR, Est Non African American: >89 mL/min
Glucose, Bld: 91 mg/dL (ref 70–99)
Potassium: 4.6 mEq/L (ref 3.5–5.3)
Sodium: 141 mEq/L (ref 135–145)

## 2011-11-02 LAB — HEPATIC FUNCTION PANEL
ALT: 21 U/L (ref 0–35)
AST: 24 U/L (ref 0–37)
Albumin: 4.5 g/dL (ref 3.5–5.2)
Alkaline Phosphatase: 59 U/L (ref 39–117)
Bilirubin, Direct: 0.1 mg/dL (ref 0.0–0.3)
Indirect Bilirubin: 0.4 mg/dL (ref 0.0–0.9)
Total Bilirubin: 0.5 mg/dL (ref 0.3–1.2)
Total Protein: 7.4 g/dL (ref 6.0–8.3)

## 2011-11-02 LAB — LIPID PANEL
Cholesterol: 232 mg/dL — ABNORMAL HIGH (ref 0–200)
HDL: 51 mg/dL (ref >39)
LDL Cholesterol: 131 mg/dL — ABNORMAL HIGH (ref 0–99)
Total CHOL/HDL Ratio: 4.5 Ratio
Triglycerides: 251 mg/dL — ABNORMAL HIGH (ref <150)
VLDL: 50 mg/dL — ABNORMAL HIGH (ref 0–40)

## 2011-11-02 LAB — TSH: TSH: 1.061 u[IU]/mL (ref 0.350–4.500)

## 2011-11-02 MED ORDER — ISOMETHEPTENE-APAP-DICHLORAL 65-325-100 MG PO CAPS: 1.0000 | ORAL_CAPSULE | ORAL | Status: DC | PRN

## 2011-11-02 MED ORDER — ALBUTEROL SULFATE HFA 108 (90 BASE) MCG/ACT IN AERS: 2.0000 | INHALATION_SPRAY | Freq: Four times a day (QID) | RESPIRATORY_TRACT | Status: DC | PRN

## 2011-11-02 NOTE — Telephone Encounter (Signed)
Please advise 

## 2011-11-02 NOTE — Telephone Encounter (Signed)
Message copied by Kathi Simpers on Fri Nov 02, 2011  4:41 PM ------      Message from: O'SULLIVAN, MELISSA      Created: Fri Nov 02, 2011 10:41 AM       Could you please ask Dr. Rodena Medin to authorize a 90 day supply of clonazepam to [facility]?

## 2011-11-02 NOTE — Assessment & Plan Note (Addendum)
Pap performed today. Pt counseled on healthy diet, exercise and SBE.  Will refer for bone density. Obtain fasting laboratories.

## 2011-11-02 NOTE — Telephone Encounter (Signed)
Message copied by Kathi Simpers on Fri Nov 02, 2011  4:42 PM ------      Message from: O'SULLIVAN, MELISSA      Created: Fri Nov 02, 2011 10:41 AM       Could you please ask Dr. Rodena Medin to authorize a 90 day supply of clonazepam to [facility]?

## 2011-11-02 NOTE — Telephone Encounter (Signed)
ok 

## 2011-11-02 NOTE — Patient Instructions (Signed)
Please complete your blood work prior to leaving. Schedule your bone density at the front desk. Follow up as needed.

## 2011-11-02 NOTE — Progress Notes (Signed)
Subjective:    Patient ID: Kaylee Pratt, female    DOB: 06/07/1956, 56 y.o.   MRN: [id number]  HPI  Preventatve- [facility] 10/[facility].  Pt had pap smear 2/10. Would like to have done today.  She has not had bone density since 3/10. Up to date on colo.  Kaylee Pratt-  Reports daily headaches.  Denies migraine.  Requesting refill on midrin.   Review of Systems  Constitutional: Negative for unexpected weight change.  HENT: Negative for hearing loss.   Eyes: Negative for discharge and visual disturbance.  Respiratory: Negative for cough and shortness of breath.   Cardiovascular: Negative for chest pain.  Gastrointestinal: Negative for nausea, vomiting, diarrhea and blood in stool.  Genitourinary: Negative for dysuria, frequency and hematuria.  Musculoskeletal: Negative for myalgias and arthralgias.  Neurological: Positive for headaches.  Hematological: Negative for adenopathy.  Psychiatric/Behavioral:       Anxiety   Past Medical History  Diagnosis Date  . Osteoporosis   . Asthma   . Depression   . GERD (gastroesophageal reflux disease)     History   Social History  . Marital Status: Married    Spouse Name: N/A    Number of Children: 2  . Years of Education: N/A   Occupational History  .  Kaylee Pratt   Social History Main Topics  . Smoking status: Never Smoker   . Smokeless tobacco: Never Used  . Alcohol Use: Yes     wine on occasion  . Drug Use: No  . Sexually Active: Not on file   Other Topics Concern  . 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  Family History  Problem Relation Age of Onset  . Prostate cancer Father   . Other Neg Hx     no early CVA/CAD, no cancer    No Known Allergies  Current Outpatient Prescriptions on File Prior to Visit  Medication Sig Dispense Refill  . clonazePAM (KLONOPIN) 0.5  MG tablet Take 1 tablet (0.5 mg total) by mouth at bedtime as needed for anxiety.  90 tablet  0  . escitalopram (LEXAPRO) 10 MG tablet TAKE 1 TABLET DAILY  90 tablet  0  . pantoprazole (PROTONIX) 40 MG tablet TAKE 1 TABLET DAILY  90 tablet  0    BP 114/66  Pulse 69  Temp(Src) 97.7 F (36.5 C) (Oral)  Resp 16  Ht 5\' 1"  (1.549 m)  Wt 127 lb (57.607 kg)  BMI 24.00 kg/m2  SpO2 98%       Objective:   Physical Exam  Physical Exam  Constitutional: She is oriented to person, place, and time. She appears well-developed and well-nourished. No distress.  HENT:  Head: Normocephalic and atraumatic.  Right Ear: Tympanic membrane and ear canal normal.  Left Ear: Tympanic membrane and ear canal normal.  Mouth/Throat: Oropharynx is clear and moist.  Eyes: Pupils are equal, round, and reactive to light. No scleral icterus.  Neck: Normal range of motion. No thyromegaly present.  Cardiovascular: Normal rate and regular rhythm.   No murmur heard. Pulmonary/Chest: Effort normal and breath sounds normal. No respiratory distress. He has no wheezes. She has no rales. She exhibits no tenderness.  Abdominal: Soft. Bowel sounds are normal. He exhibits no distension and no mass. There is no tenderness. There is no  rebound and no guarding.  Musculoskeletal: She exhibits no edema.  Lymphadenopathy:    She has no cervical adenopathy.  Neurological: She is alert and oriented to person, place, and time. She has normal reflexes. She exhibits normal muscle tone. Coordination normal.  Skin: Skin is warm and dry.  Psychiatric: She has a normal mood and affect. Her behavior is normal. Judgment and thought content normal.  Breasts: Examined lying Right: Without masses, retractions, discharge or axillary adenopathy.  Left: Without masses, retractions, discharge or axillary adenopathy.  Inguinal/mons: Normal without inguinal adenopathy  External genitalia: Normal  BUS/Urethra/Skene's glands: Normal  Bladder:  Normal  Vagina: Normal  Cervix: Normal  Uterus: normal in size, shape and contour. Midline and mobile  Adnexa/parametria:  Rt: Without masses or tenderness.  Lt: Without masses or tenderness.  Anus and perineum: Normal           Assessment & Plan:         Assessment & Plan:

## 2011-11-03 LAB — VITAMIN D 25 HYDROXY (VIT D DEFICIENCY, FRACTURES): Vit D, 25-Hydroxy: 62 ng/mL (ref 30–89)

## 2011-11-05 ENCOUNTER — Telehealth: Payer: Self-pay | Admitting: Family

## 2011-11-05 MED ORDER — CLONAZEPAM 0.5 MG PO TABS: 0.5000 mg | ORAL_TABLET | Freq: Every evening | ORAL | Status: DC | PRN

## 2011-11-05 NOTE — Telephone Encounter (Signed)
Pt requested to pick up printed Rx. Rx printed and forwarded to Provider for signature.

## 2011-11-05 NOTE — Telephone Encounter (Signed)
Pls call pt and let her know that her lab work looks good- thyroid, blood count, liver function, kidney, sugar are all normal.  Her cholesterol is a bit elevated.  She should try to work on a low fat/low cholesterol diet and exercise.

## 2011-11-05 NOTE — Telephone Encounter (Signed)
Notified pt. She states she does not eat much and does not eat fried foods. States she eats a lot fruits.  Pt wants to know if she should start a cholesterol medication? Please advise.

## 2011-11-05 NOTE — Telephone Encounter (Signed)
Rx signed and pt notified. Rx placed at front desk for pick up.

## 2011-11-06 NOTE — Telephone Encounter (Signed)
Notified pt and she voices understanding. She states she will pick the diet sheet up from our office. Handout has been left at the front desk for pick up.

## 2011-11-06 NOTE — Telephone Encounter (Signed)
She can add fish oil as noted on med list.  Also, please mail her a copy of low cholesterol diet handout.

## 2011-11-07 ENCOUNTER — Encounter: Payer: Self-pay | Admitting: Family

## 2011-11-07 ENCOUNTER — Telehealth: Payer: Self-pay | Admitting: *Deleted

## 2011-11-07 NOTE — Telephone Encounter (Signed)
Received fax from [employer] for order of compression garments. Signed order faxed to [phone].

## 2011-11-08 ENCOUNTER — Telehealth: Payer: Self-pay | Admitting: *Deleted

## 2011-11-08 ENCOUNTER — Encounter: Payer: Self-pay | Admitting: *Deleted

## 2011-11-08 NOTE — Telephone Encounter (Signed)
Opened in error

## 2011-11-08 NOTE — Telephone Encounter (Signed)
Most recent colonoscopy result received and it was date 09/06/08. Report forwarded to Provider for review.

## 2011-11-08 NOTE — Telephone Encounter (Signed)
Spoke with Elroy Channel and requested below report.

## 2011-11-08 NOTE — Telephone Encounter (Signed)
Message copied by Kathi Simpers on Thu Nov 08, 2011  9:29 AM ------      Message from: O'SULLIVAN, MELISSA      Created: Fri Nov 02, 2011 10:25 AM       Could you pls request colo report from [facility] 2009?

## 2011-11-09 ENCOUNTER — Encounter: Payer: Self-pay | Admitting: Family

## 2011-11-15 ENCOUNTER — Telehealth: Payer: Self-pay | Admitting: *Deleted

## 2011-11-15 NOTE — Telephone Encounter (Signed)
Received call from pt stating the pharmacy told her that Midrin has been continued; verified this with pharmacy. Pt states she sees Dr Rodena Medin for primary care and only saw Melissa for pap smear (did not change providers). Please advise what pt can take for migraines?

## 2011-11-15 NOTE — Telephone Encounter (Signed)
Notified pt, she requests appt for Monday. Appt scheduled for 9:15.

## 2011-11-15 NOTE — Telephone Encounter (Signed)
i placed her on it 8/12 but haven't seen her since. For more aggressive medications probably need to see her

## 2011-11-19 ENCOUNTER — Other Ambulatory Visit: Payer: Self-pay | Admitting: Internal Medicine

## 2011-11-19 ENCOUNTER — Ambulatory Visit: Payer: 59 | Admitting: Internal Medicine

## 2011-11-19 ENCOUNTER — Encounter: Payer: Self-pay | Admitting: Gastroenterology

## 2011-11-19 NOTE — Telephone Encounter (Signed)
Refill sent to [facility], #90 x 1 refill.

## 2011-11-19 NOTE — Telephone Encounter (Signed)
Left message on pt's cell # to call and let me know if refill should go to mail order service.

## 2011-11-19 NOTE — Telephone Encounter (Signed)
Patient states that refill should go to mail order service- [facility]

## 2012-01-02 ENCOUNTER — Ambulatory Visit (INDEPENDENT_AMBULATORY_CARE_PROVIDER_SITE_OTHER): Payer: 59 | Admitting: Sports Medicine

## 2012-01-02 ENCOUNTER — Encounter: Payer: Self-pay | Admitting: Sports Medicine

## 2012-01-02 VITALS — BP 122/80 | HR 88 | Ht 60.0 in | Wt 122.0 lb

## 2012-01-02 DIAGNOSIS — M722 Plantar fascial fibromatosis: Secondary | ICD-10-CM | POA: Insufficient documentation

## 2012-01-02 DIAGNOSIS — M2170 Unequal limb length (acquired), unspecified site: Secondary | ICD-10-CM

## 2012-01-02 NOTE — Assessment & Plan Note (Addendum)
Partially corrected today with heal lift.  She should use lift in any shoes for walking or prolonged standing

## 2012-01-02 NOTE — Progress Notes (Signed)
Patient ID: SHARIYA GASTER, female   DOB: Aug 27, 1956, 55 y.o.   MRN: [id number] Patient presents today for left heel pain that has been going on for the past one to 2 months. It came with no specific provocation and has been gradually worsening. It is made worse by prolonged standing or walking. The patient has tried over-the-counter orthotics which have not provided significant relief. The patient also reports that after a significant period of standing or walking she gets some lateral lower leg pain, more so on the left than on the right.  She does pilates but this does not cause pain  Objective: Gen: Appropriate weight female, no distress Leg: 1 to 1.5 cm leg length discrepancy, left leg shorter than right Foot: High arch bilaterally.  No forefoot collapse.  Pain on palpation at the insertion of the plantar fascia on the left.  MSK [place]: Thickened plantar fascia (to 0.6cm) on the left with surrounding fluid.  PF on right is normal (0.35 cm).  Thickening on the left progresses to the first 3 to 4 cms of the PF.

## 2012-01-02 NOTE — Patient Instructions (Signed)
It was great to see you today! Please do the exercises we gave you two times per day. Place your foot in ice water for 5-10 minutes several times per day. Come back to see [place] in about 2 months so we can repeat your ultrasound and see if you are getting better.

## 2012-01-02 NOTE — Assessment & Plan Note (Signed)
Provided patient with sports insoles, left foot arch strap, and exercises for PF.  Will have patient RTC in 2 months to assess progress.  Would plan repeat US at that time.

## 2012-02-27 ENCOUNTER — Ambulatory Visit (INDEPENDENT_AMBULATORY_CARE_PROVIDER_SITE_OTHER): Payer: 59 | Admitting: Sports Medicine

## 2012-02-27 VITALS — BP 131/83

## 2012-02-27 DIAGNOSIS — M2170 Unequal limb length (acquired), unspecified site: Secondary | ICD-10-CM

## 2012-02-27 DIAGNOSIS — M722 Plantar fascial fibromatosis: Secondary | ICD-10-CM

## 2012-02-27 NOTE — Assessment & Plan Note (Addendum)
Steroid injection given today into left medial PF.  Pt given instructions on postinjection care.  Pt encouraged to continue PF exercises and ice daily.   Pt to return as needed for follow up in 6 to 8 wks  Consent obtained and verified. Sterileprep. Furthur cleansed with alcohol. Topical analgesic spray: Ethyl chloride. Joint: left PF Approached in typical fashion with: spray of medial heel  And then direct injection above PF membrane into clear space Completed without difficulty Meds: 1 cc kenalog 10 + 1.5 cc lidocaine 1% Needle:25 g and 1.5 in Aftercare instructions and Red flags advised.  Cont on previous exercise and stretch protocol

## 2012-02-27 NOTE — Assessment & Plan Note (Signed)
Given heel lifts for other left shoes to see if we can make her have less pressure on left heel

## 2012-02-27 NOTE — Progress Notes (Signed)
  Subjective:    Patient ID: Kaylee Pratt, female    DOB: [birth year], 56 y.o.   MRN: [id number]  HPI followup plantar fasciitis: Patient here for followup 2 months after diagnosis of plantar fasciitis of left foot. Patient had a back injury that caused her not be able to do exercises due to pain. Has been doing plantar fasciitis exercises x2 weeks. Only uses ice occasional since she doesn't like the discomfort. Pt uses arch strap sometimes. Only uses heel lift in her tennis shoes not any of her other shoes-so only uses these occasionally. Was given heel lift for leg length discrepancy. Patient states the pain is not much improved are unchanged compared to prior visit. No foot redness. No foot swelling.   Review of Systems As per above.    Objective:   Physical Exam  Constitutional: She appears well-developed and well-nourished.  Cardiovascular: Normal rate.   Pulmonary/Chest: Effort normal.  Musculoskeletal:       Left foot: Normal appearance. No redness. No swelling. Normal strength. Normal sensation. + tenderness with medial insertion on calcaneus of PF.  No tenderness in arch. Normal rom.    PF injection: A steroid injection was performed at medial PF using 1% plain Lidocaine and 10 mg of Kenalog. This was well tolerated.       Assessment & Plan:

## 2012-03-28 ENCOUNTER — Telehealth: Payer: Self-pay | Admitting: *Deleted

## 2012-03-28 ENCOUNTER — Ambulatory Visit (INDEPENDENT_AMBULATORY_CARE_PROVIDER_SITE_OTHER): Payer: 59 | Admitting: Family Medicine

## 2012-03-28 ENCOUNTER — Encounter: Payer: Self-pay | Admitting: Family Medicine

## 2012-03-28 VITALS — BP 121/76 | HR 73 | Temp 97.9°F | Ht 61.0 in | Wt 123.0 lb

## 2012-03-28 DIAGNOSIS — F411 Generalized anxiety disorder: Secondary | ICD-10-CM

## 2012-03-28 DIAGNOSIS — R51 Headache: Secondary | ICD-10-CM

## 2012-03-28 DIAGNOSIS — F419 Anxiety disorder, unspecified: Secondary | ICD-10-CM

## 2012-03-28 MED ORDER — CLONAZEPAM 1 MG PO TABS: ORAL_TABLET | ORAL | Status: DC

## 2012-03-28 MED ORDER — SUMATRIPTAN SUCCINATE 100 MG PO TABS: ORAL_TABLET | ORAL | Status: DC

## 2012-03-28 NOTE — Telephone Encounter (Signed)
Received call from pt stating she has not felt well all week. Has had a headache, nausea and intermittent vomiting, not sleeping well all week. Pt reports episode earlier this afternoon of "difficulty breathing, felt like my throat was closing up". States that she drunk some water and is feeling better now. Advised pt per Provider that she should be evaluated today. Pt given appt to see Dr Milinda Cave today at 3:30. Provided pt with address and phone number. Pt states she will call [facility] if she cannot make appt today.

## 2012-03-28 NOTE — Progress Notes (Signed)
OFFICE VISIT  03/31/2012   CC:  Chief Complaint  Patient presents with  . Stress     HPI:    Patient is a 56 y.o. asian female who presents for "breathing issues". She further clarifies by describing herself as being under a tremendous amount of stress at work, feels overwhelmed with responsibilities, feels like her boss doesn't care.  Having chronic nausea, esp the last week, most days barely gets home after work before throwing up.   She is fatigued, has poor appetite, +headaches.  Describes feeling of suffocating, " I don't know what's wrong or what to do". Dizziness of and on.  No fevers, no diarrhea.  No wheezing, SOB, chest pain, or cough. She works in Consulting civil engineer at [employer]. Has hx of anxiety and depression, was on lexapro in the past and it helped but she eventually got off of it due to excessive wt gain. She has hx of migraine Leonila's, says her migraine med (midrin) has been discontinued and she asks for a trial of something else.  Past Medical History  Diagnosis Date  . Osteoporosis   . Asthma   . Depression   . GERD (gastroesophageal reflux disease)     History reviewed. No pertinent past surgical history.  Outpatient Prescriptions Prior to Visit  Medication Sig Dispense Refill  . albuterol (PROVENTIL HFA;VENTOLIN HFA) 108 (90 BASE) MCG/ACT inhaler Inhale 2 puffs into the lungs every 6 (six) hours as needed for wheezing.  1 Inhaler  0  . levocetirizine (XYZAL) 5 MG tablet Take 5 mg by mouth daily.      . pantoprazole (PROTONIX) 40 MG tablet TAKE 1 TABLET DAILY  90 tablet  1  . clonazePAM (KLONOPIN) 0.5 MG tablet Take 1 tablet (0.5 mg total) by mouth at bedtime as needed for anxiety.  90 tablet  0  . escitalopram (LEXAPRO) 10 MG tablet TAKE 1 TABLET DAILY  90 tablet  0  . fish oil-omega-3 fatty acids 1000 MG capsule Take 2 g by mouth daily.      [name] Kitchen isometheptene-acetaminophen-dichloralphenazone (MIDRIN) 65-325-100 MG capsule Take 1 capsule by mouth every 4 (four) hours as  needed.  30 capsule  0  . QVAR 80 MCG/ACT inhaler Inhale 1 puff into the lungs Twice daily as needed.        No Known Allergies  ROS As per HPI  PE: Blood pressure 121/76, pulse 73, temperature 97.9 F (36.6 C), temperature source Temporal, height 5\' 1"  (1.549 m), weight 123 lb (55.792 kg), SpO2 99.00%. Gen: Alert, well appearing.  Patient is oriented to person, place, time, and situation. AFFECT: pleasant.  Lucid thought and speech. ENT: Ears: EACs clear, normal epithelium.  TMs with good light reflex and landmarks bilaterally.  Eyes: no injection, icteris, swelling, or exudate.  EOMI, PERRLA. Nose: no drainage or turbinate edema/swelling.  No injection or focal lesion.  Mouth: lips without lesion/swelling.  Oral mucosa pink and moist.  Dentition intact and without obvious caries or gingival swelling.  Oropharynx without erythema, exudate, or swelling.  Neck - No masses or thyromegaly or limitation in range of motion CV: RRR, no m/r/g.   LUNGS: CTA bilat, nonlabored resps, good aeration in all lung fields. ABD: soft, NT, ND, BS normal.  No hepatospenomegaly or mass.  No bruits. EXT: no clubbing, cyanosis, or edema.   LABS:  none  IMPRESSION AND PLAN:  GAD (generalized anxiety disorder) With panic.   Needs to restart lexapro 10mg  qd.  Therapeutic expectations and side effect profile  of medication discussed today.  Patient's questions answered. Increase clonazepam to 1 mg, 1/2-1 tab bid prn.  Headache Start trial of generic imitrex 100mg  prn, may repeat in 2 hrs, max in 24h is 200mg .  Therapeutic expectations and side effect profile of medication discussed today.  Patient's questions answered.    Spent 25 min with pt today, with >50% of this time spent in counseling and care coordination for the above problems. FOLLOW UP: Return for 4-6 wks with Dr. Rodena Medin.

## 2012-03-31 NOTE — Assessment & Plan Note (Signed)
With panic.   Needs to restart lexapro 10mg  qd.  Therapeutic expectations and side effect profile of medication discussed today.  Patient's questions answered. Increase clonazepam to 1 mg, 1/2-1 tab bid prn.

## 2012-03-31 NOTE — Assessment & Plan Note (Signed)
Start trial of generic imitrex 100mg  prn, may repeat in 2 hrs, max in 24h is 200mg .  Therapeutic expectations and side effect profile of medication discussed today.  Patient's questions answered.

## 2012-04-14 ENCOUNTER — Telehealth: Payer: Self-pay | Admitting: Family

## 2012-04-14 DIAGNOSIS — M810 Age-related osteoporosis without current pathological fracture: Secondary | ICD-10-CM

## 2012-04-14 NOTE — Telephone Encounter (Signed)
Spoke with pt re: Reclast infusion.  Her last infusion was >12 mos ago.  She is agreeable to proceed with infusion, but will call us back to let us know some good dates for her schedule.

## 2012-04-18 NOTE — Telephone Encounter (Signed)
She wants to go for her reclast on the September 19th in the afternoon.

## 2012-04-22 ENCOUNTER — Encounter: Payer: Self-pay | Admitting: Family Medicine

## 2012-04-22 ENCOUNTER — Ambulatory Visit (INDEPENDENT_AMBULATORY_CARE_PROVIDER_SITE_OTHER): Payer: 59 | Admitting: Family Medicine

## 2012-04-22 ENCOUNTER — Ambulatory Visit (HOSPITAL_BASED_OUTPATIENT_CLINIC_OR_DEPARTMENT_OTHER)
Admission: RE | Admit: 2012-04-22 | Discharge: 2012-04-22 | Disposition: A | Discharge status: Home / Self Care | Payer: 59 | Source: Ambulatory Visit | Attending: Family Medicine | Admitting: Family Medicine

## 2012-04-22 VITALS — BP 106/75 | HR 69 | Ht 60.0 in | Wt 120.0 lb

## 2012-04-22 DIAGNOSIS — M79609 Pain in unspecified limb: Secondary | ICD-10-CM

## 2012-04-22 DIAGNOSIS — M79605 Pain in left leg: Secondary | ICD-10-CM

## 2012-04-22 DIAGNOSIS — S6992XA Unspecified injury of left wrist, hand and finger(s), initial encounter: Secondary | ICD-10-CM

## 2012-04-22 DIAGNOSIS — S6980XA Other specified injuries of unspecified wrist, hand and finger(s), initial encounter: Secondary | ICD-10-CM

## 2012-04-22 DIAGNOSIS — S6990XA Unspecified injury of unspecified wrist, hand and finger(s), initial encounter: Secondary | ICD-10-CM

## 2012-04-22 DIAGNOSIS — W19XXXA Unspecified fall, initial encounter: Secondary | ICD-10-CM | POA: Insufficient documentation

## 2012-04-22 NOTE — Telephone Encounter (Signed)
Attempted to schedule reclast. Pt does not have calcium, creatinine within the last 30 days. Pt will need to complete bloodwork before we can scheduled Reclast date. Left message for pt to return my call.

## 2012-04-22 NOTE — Telephone Encounter (Signed)
Will call to schedule Reclast infusion after lab results are final.

## 2012-04-22 NOTE — Patient Instructions (Addendum)
The x-ray of your pinky finger was negative for a fracture. If this doesn't continue to improve over next 2 weeks return for reevaluation.  For your left leg I measured both to be 77cm and equal. While I don't think a lift will help you, arch support should help with the pain in outside left lower leg. Other treatment for this is rehab exercises - calf raises, theraband exercise pushing outwards - 3 sets of 10 once a day. Calf sleeve may help with pain as well. Heat 15 minutes at a time 3-4 times a day. Aleve or ibuprofen as needed for pain. This should resolve over next 4-6 weeks - make sure you do exercises daily.

## 2012-04-22 NOTE — Telephone Encounter (Signed)
Pt returned my call and will complete bloodwork tomorrow after f/u with Dr Rodena Medin. Future lab order entered and copy given to the lab.

## 2012-04-22 NOTE — Telephone Encounter (Signed)
Trish, could you please help arrange this? Thanks.

## 2012-04-23 ENCOUNTER — Encounter: Payer: Self-pay | Admitting: Internal Medicine

## 2012-04-23 ENCOUNTER — Ambulatory Visit (INDEPENDENT_AMBULATORY_CARE_PROVIDER_SITE_OTHER): Payer: 59 | Admitting: Internal Medicine

## 2012-04-23 VITALS — BP 116/62 | HR 68 | Temp 98.0°F | Resp 14 | Wt 124.8 lb

## 2012-04-23 DIAGNOSIS — F411 Generalized anxiety disorder: Secondary | ICD-10-CM

## 2012-04-23 DIAGNOSIS — R51 Headache: Secondary | ICD-10-CM

## 2012-04-23 DIAGNOSIS — M810 Age-related osteoporosis without current pathological fracture: Secondary | ICD-10-CM

## 2012-04-23 LAB — BASIC METABOLIC PANEL
BUN: 13 mg/dL (ref 6–23)
CO2: 27 mEq/L (ref 19–32)
Calcium: 8.9 mg/dL (ref 8.4–10.5)
Chloride: 107 mEq/L (ref 96–112)
Creat: 0.64 mg/dL (ref 0.50–1.10)
Glucose, Bld: 78 mg/dL (ref 70–99)
Potassium: 4.6 mEq/L (ref 3.5–5.3)
Sodium: 142 mEq/L (ref 135–145)

## 2012-04-23 MED ORDER — CYCLOBENZAPRINE HCL 5 MG PO TABS: 5.0000 mg | ORAL_TABLET | Freq: Every evening | ORAL | Status: DC | PRN

## 2012-04-23 MED ORDER — CLONAZEPAM 1 MG PO TABS: ORAL_TABLET | ORAL | Status: DC

## 2012-04-23 MED ORDER — ESCITALOPRAM OXALATE 10 MG PO TABS: 10.0000 mg | ORAL_TABLET | Freq: Every day | ORAL | Status: DC

## 2012-04-23 NOTE — Progress Notes (Signed)
  Subjective:    Patient ID: Kaylee Pratt, female    DOB: 05-23-1956, 56 y.o.   MRN: [id number]  HPI Pt presents to clinic for followup of multiple medical problems. Recently seen and resumed lexapro (5mg  had at home) and increased klonopin. Notes continued anxiety and has intermittent panic. Also c/o daily headaches s/p trial of imitrex. States often begin in posterior neck before generalizing. No associated neurologic sx's.   Past Medical History  Diagnosis Date  . Osteoporosis   . Asthma   . Depression   . GERD (gastroesophageal reflux disease)    No past surgical history on file.  reports that she has never smoked. She has never used smokeless tobacco. She reports that she drinks alcohol. She reports that she does not use illicit drugs. family history includes Prostate cancer in her father.  There is no history of Other. No Known Allergies    Review of Systems see hpi     Objective:   Physical Exam  Nursing note and vitals reviewed. Constitutional: She appears well-developed and well-nourished. No distress.  HENT:  Head: Normocephalic and atraumatic.  Right Ear: External ear normal.  Left Ear: External ear normal.  Eyes: Conjunctivae and EOM are normal. Pupils are equal, round, and reactive to light. No scleral icterus.  Neck: Neck supple.  Neurological: She is alert. No cranial nerve deficit. Coordination normal.  Skin: She is not diaphoretic.  Psychiatric: She has a normal mood and affect. Her behavior is normal.          Assessment & Plan:

## 2012-04-23 NOTE — Assessment & Plan Note (Signed)
Suspect possible tension headache etiology. Attempt flexeril qhs prn. Followup if no improvement or worsening.

## 2012-04-23 NOTE — Assessment & Plan Note (Signed)
Increase lexapro 10mg  qd. Rf klonopin. Schedule f/u.

## 2012-04-24 ENCOUNTER — Encounter: Payer: Self-pay | Admitting: Family

## 2012-04-25 ENCOUNTER — Ambulatory Visit: Payer: 59 | Admitting: Internal Medicine

## 2012-04-25 ENCOUNTER — Other Ambulatory Visit: Payer: Self-pay | Admitting: Family

## 2012-04-25 ENCOUNTER — Encounter: Payer: Self-pay | Admitting: Family Medicine

## 2012-04-25 DIAGNOSIS — M810 Age-related osteoporosis without current pathological fracture: Secondary | ICD-10-CM

## 2012-04-25 NOTE — Telephone Encounter (Signed)
Appt has been arranged for 04/30/12 at 10:30. Pt will register in admitting.  Pt may call them to r/s as they have no openings on 05/29/12. Notified pt and provided her with contact # to r/s reclast for a more convenient time for her.

## 2012-04-25 NOTE — Telephone Encounter (Signed)
Labs are final, order forwarded to Provider for signature then can schedule appt at [facility].

## 2012-04-25 NOTE — Progress Notes (Signed)
Subjective:    Patient ID: Kaylee Pratt, female    DOB: 1956-01-30, 56 y.o.   MRN: [id number]  PCP: Dr. Rodena Pratt  HPI 56 yo F here for left 5th finger and leg pain s/p fall.  Patient reports on 8/11 while getting out of shower she slipped and fell landing on left shin and left hand, injuring left 5th digit. Overall pinky is improving - swelling has gone down. Left shin laterally bothers her. Using a heel lift left side for leg length inequality - feels has had more hip, knee, lower leg pain since starting to use this. Kaylee Pratt bothers her during sleep. Not taking anything for pain currently. Has not done PT - dx with plantar fasciitis and has done HEP though.  Past Medical History  Diagnosis Date  . Osteoporosis   . Asthma   . Depression   . GERD (gastroesophageal reflux disease)   . Allergy     Current Outpatient Prescriptions on File Prior to Visit  Medication Sig Dispense Refill  . albuterol (PROVENTIL HFA;VENTOLIN HFA) 108 (90 BASE) MCG/ACT inhaler Inhale 2 puffs into the lungs every 6 (six) hours as needed for wheezing.  1 Inhaler  0  . aspirin EC 81 MG tablet Take 81 mg by mouth daily.      . clonazePAM (KLONOPIN) 1 MG tablet 1/2 - 1 tab po bid  180 tablet  1  . escitalopram (LEXAPRO) 10 MG tablet Take 1 tablet (10 mg total) by mouth daily.  90 tablet  2  . fish oil-omega-3 fatty acids 1000 MG capsule Take 2 g by mouth daily.      [name] Kitchen isometheptene-acetaminophen-dichloralphenazone (MIDRIN) 65-325-100 MG capsule Take 1 capsule by mouth every 4 (four) hours as needed.  30 capsule  0  . levocetirizine (XYZAL) 5 MG tablet Take 5 mg by mouth daily.      [name] Kitchen QVAR 80 MCG/ACT inhaler Inhale 1 puff into the lungs Twice daily as needed.      . SUMAtriptan (IMITREX) 100 MG tablet 1 tab po at onset of Kaylee Pratt, may repeat in 2 hours if needed (max in 24h is 200 mg)  9 tablet  0  . DISCONTD: pantoprazole (PROTONIX) 40 MG tablet TAKE 1 TABLET DAILY  90 tablet  1    History reviewed. No pertinent past  surgical history.  No Known Allergies  History   Social History  . Marital Status: Kaylee Pratt    Spouse Name: N/A    Number of Children: 2  . Years of Education: N/A   Occupational History  .  [employer] Kaylee Pratt   Social History Main Topics  . Smoking status: Never Smoker   . Smokeless tobacco: Never Used  . Alcohol Use: Yes     wine on occasion  . Drug Use: No  . Sexually Active: Not on file   Other Topics Concern  . 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 Prostate cancer Father   . Other Neg Hx     no early CVA/CAD, no cancer  . Sudden death Neg Hx   . Diabetes Neg Hx   . Heart attack Neg Hx   . Hyperlipidemia Neg Hx   . Hypertension Neg Hx     BP 106/75  Pulse 69  Ht 5' (1.524 m)  Wt 120 lb (54.432 kg)  BMI 23.44 kg/m2  Review of Systems See HPI above.    Objective:   Physical Exam Gen: NAD  Left hand: No gross deformity, swelling, bruising.  No angulation or malrotation of 5th digit. Mild pain circumferentially about DIP.  No other hand, digit TTP. FROM MCP, PIP, DIP joints of 5th digit - able to resist these motions. Collateral ligaments intact  NVI distally.  Left leg: No gross deformity, swelling, bruising.  No palpable cords. Mild TTP posterior to fibula mid-distal lower leg laterally.  No tibial, malleolar, other TTP about lower leg. FROM ankle with mild pain ext rotation.  Strength 5/5 all ankle motions. NVI distally. Leg lengths measured 77cm from ASIS to medial malleoli bilaterally.    Assessment & Plan:  1. Left 5th digit injury - Radiographs negative and exam reassuring.  2/2 mild sprain.    2. Left leg pain - consistent with lower leg contusion, strain of peroneal muscles.  Shown home exercise program.  Discussed heat to help with spasms, calf sleeve for compression.  Arch  support to prevent pronation that overuses peroneal muscles.  Leg lengths measured equal from ASIS to medial malleoli - advised to discontinue lift use unless this helps with her PF.

## 2012-04-25 NOTE — Telephone Encounter (Signed)
Once order has been signed by Provider it will need to be faxed to [phone].

## 2012-04-28 ENCOUNTER — Encounter: Payer: Self-pay | Admitting: Family Medicine

## 2012-04-28 ENCOUNTER — Telehealth: Payer: Self-pay | Admitting: Family

## 2012-04-28 DIAGNOSIS — S6992XA Unspecified injury of left wrist, hand and finger(s), initial encounter: Secondary | ICD-10-CM | POA: Insufficient documentation

## 2012-04-28 DIAGNOSIS — M79605 Pain in left leg: Secondary | ICD-10-CM | POA: Insufficient documentation

## 2012-04-28 NOTE — Telephone Encounter (Signed)
Opened in error

## 2012-04-28 NOTE — Assessment & Plan Note (Signed)
Radiographs negative and exam reassuring.  2/2 mild sprain.

## 2012-04-28 NOTE — Assessment & Plan Note (Signed)
consistent with lower leg contusion, strain of peroneal muscles.  Shown home exercise program.  Discussed heat to help with spasms, calf sleeve for compression.  Arch support to prevent pronation that overuses peroneal muscles.  Leg lengths measured equal from ASIS to medial malleoli - advised to discontinue lift use unless this helps with her PF.

## 2012-04-29 ENCOUNTER — Telehealth: Payer: Self-pay | Admitting: *Deleted

## 2012-04-29 NOTE — Telephone Encounter (Signed)
Physician Orders for Reclast faxed to [facility] Short Stay at [phone]/SLS 08.19.13 17:35pm

## 2012-04-30 ENCOUNTER — Other Ambulatory Visit (HOSPITAL_COMMUNITY): Payer: Self-pay | Admitting: *Deleted

## 2012-04-30 ENCOUNTER — Encounter (HOSPITAL_COMMUNITY): Payer: 59

## 2012-05-06 ENCOUNTER — Encounter (HOSPITAL_COMMUNITY)
Admission: RE | Admit: 2012-05-06 | Discharge: 2012-05-06 | Disposition: A | Discharge status: Home / Self Care | Payer: 59 | Source: Ambulatory Visit | Attending: Family | Admitting: Family

## 2012-05-06 ENCOUNTER — Telehealth: Payer: Self-pay | Admitting: Family

## 2012-05-06 ENCOUNTER — Encounter (HOSPITAL_COMMUNITY): Payer: Self-pay

## 2012-05-06 VITALS — BP 133/74 | HR 69 | Temp 98.0°F | Resp 16 | Ht 60.0 in | Wt 122.0 lb

## 2012-05-06 DIAGNOSIS — M810 Age-related osteoporosis without current pathological fracture: Secondary | ICD-10-CM

## 2012-05-06 MED ORDER — CALCIUM CARBONATE-VITAMIN D 600-400 MG-UNIT PO TABS: 1.0000 | ORAL_TABLET | Freq: Two times a day (BID) | ORAL | Status: DC

## 2012-05-06 MED ORDER — SODIUM CHLORIDE 0.9 % IV SOLN
Freq: Once | INTRAVENOUS | Status: AC
  Administered 2012-05-06: 15:00:00 via INTRAVENOUS

## 2012-05-06 MED ORDER — ZOLEDRONIC ACID 5 MG/100ML IV SOLN
5.0000 mg | Freq: Once | INTRAVENOUS | Status: AC
  Administered 2012-05-06: 5 mg via INTRAVENOUS
  Filled 2012-05-06: qty 100

## 2012-05-06 NOTE — Telephone Encounter (Signed)
Message copied by Sandford Craze on Tue May 06, 2012  4:46 PM ------      Message from: Fabienne Bruns      Created: Tue May 06, 2012  3:30 PM      Regarding: calcium       Patient states she is currently not on calcium. It is checked on the order sheet she is. Reclast given . Patient encouraged to eat/drink high calcium foods and check with you for rechecks on her calcium level. Her calcium level is on the low side of normal.      Thanks Tammy

## 2012-05-06 NOTE — Telephone Encounter (Signed)
Please call patient and instruct her to take caltrate 600mg  + D bid for bones.  This is especially important after her reclast infusion.

## 2012-05-09 NOTE — Telephone Encounter (Signed)
Notified pt. 

## 2012-05-23 ENCOUNTER — Encounter (HOSPITAL_COMMUNITY): Payer: 59

## 2012-05-28 ENCOUNTER — Other Ambulatory Visit: Payer: Self-pay | Admitting: Internal Medicine

## 2012-05-28 NOTE — Telephone Encounter (Signed)
Refill sent.

## 2012-05-28 NOTE — Telephone Encounter (Signed)
OK to send #30 no refills.  

## 2012-05-28 NOTE — Telephone Encounter (Signed)
Cyclobenzaprine Rx last sent on 04/25/12 #30.  Please advise re: additional refills.

## 2012-07-23 ENCOUNTER — Ambulatory Visit: Payer: 59 | Admitting: Internal Medicine

## 2012-07-28 ENCOUNTER — Ambulatory Visit (INDEPENDENT_AMBULATORY_CARE_PROVIDER_SITE_OTHER): Payer: 59 | Admitting: Internal Medicine

## 2012-07-28 ENCOUNTER — Encounter: Payer: Self-pay | Admitting: Internal Medicine

## 2012-07-28 VITALS — BP 114/78 | HR 77 | Temp 98.3°F | Resp 14 | Wt 129.0 lb

## 2012-07-28 DIAGNOSIS — R51 Headache: Secondary | ICD-10-CM

## 2012-07-28 DIAGNOSIS — F411 Generalized anxiety disorder: Secondary | ICD-10-CM

## 2012-07-28 MED ORDER — CYCLOBENZAPRINE HCL 5 MG PO TABS: 5.0000 mg | ORAL_TABLET | Freq: Two times a day (BID) | ORAL | Status: DC | PRN

## 2012-07-28 MED ORDER — ESCITALOPRAM OXALATE 20 MG PO TABS: 20.0000 mg | ORAL_TABLET | Freq: Every day | ORAL | Status: DC

## 2012-07-28 NOTE — Patient Instructions (Signed)
Please schedule fasting labs for next week Lipid/lft-272.4

## 2012-08-03 NOTE — Progress Notes (Signed)
  Subjective:    Patient ID: Kaylee Pratt, female    DOB: 30-Nov-1955, 56 y.o.   MRN: [id number]  HPI Pt presents to clinic for followup of multiple medical problems. Notes significant anxiety related to current job. Has intermittent anxiety attacks. Occurs despite lexapro and klonopin. Is attempting to change jobs within same employer but is considering temporary leave if necessary. Notes that flexeril helps tension headaches. Has received flu vaccine.   Past Medical History  Diagnosis Date  . Osteoporosis   . Asthma   . Depression   . GERD (gastroesophageal reflux disease)   . Allergy    No past surgical history on file.  reports that she has never smoked. She has never used smokeless tobacco. She reports that she drinks alcohol. She reports that she does not use illicit drugs. family history includes Prostate cancer in her father.  There is no history of Other, and Sudden death, and Diabetes, and Heart attack, and Hyperlipidemia, and Hypertension, . No Known Allergies    Review of Systems see hpi     Objective:   Physical Exam  Nursing note and vitals reviewed. Constitutional: She appears well-developed and well-nourished. No distress.  HENT:  Head: Normocephalic and atraumatic.  Eyes: Conjunctivae normal are normal. No scleral icterus.  Cardiovascular: Normal rate, regular rhythm and normal heart sounds.   Pulmonary/Chest: Effort normal and breath sounds normal.  Neurological: She is alert.  Skin: She is not diaphoretic.  Psychiatric: She has a normal mood and affect. Her behavior is normal. Judgment and thought content normal.          Assessment & Plan:

## 2012-08-03 NOTE — Assessment & Plan Note (Signed)
Improved with flexeril

## 2012-08-03 NOTE — Assessment & Plan Note (Signed)
Increase lexapro. Continue klonopin.

## 2012-08-28 ENCOUNTER — Telehealth: Payer: Self-pay | Admitting: Internal Medicine

## 2012-08-28 DIAGNOSIS — E785 Hyperlipidemia, unspecified: Secondary | ICD-10-CM

## 2012-08-28 NOTE — Telephone Encounter (Signed)
Lab orders placed & forwarded to lab/SLS

## 2012-08-28 NOTE — Telephone Encounter (Signed)
Patient states that she will be coming in today for lab work and would like to have the orders ready.

## 2012-09-04 LAB — LIPID PANEL
Cholesterol: 220 mg/dL — ABNORMAL HIGH (ref 0–200)
HDL: 56 mg/dL (ref >39)
LDL Cholesterol: 133 mg/dL — ABNORMAL HIGH (ref 0–99)
Total CHOL/HDL Ratio: 3.9 Ratio
Triglycerides: 153 mg/dL — ABNORMAL HIGH (ref <150)
VLDL: 31 mg/dL (ref 0–40)

## 2012-09-04 LAB — HEPATIC FUNCTION PANEL
ALT: 19 U/L (ref 0–35)
AST: 19 U/L (ref 0–37)
Albumin: 4.6 g/dL (ref 3.5–5.2)
Alkaline Phosphatase: 44 U/L (ref 39–117)
Bilirubin, Direct: 0.1 mg/dL (ref 0.0–0.3)
Indirect Bilirubin: 0.3 mg/dL (ref 0.0–0.9)
Total Bilirubin: 0.4 mg/dL (ref 0.3–1.2)
Total Protein: 7.4 g/dL (ref 6.0–8.3)

## 2012-09-04 NOTE — Telephone Encounter (Signed)
Lab orders released/SLS 

## 2012-09-04 NOTE — Addendum Note (Signed)
Addended by: Regis Bill on: 09/04/2012 11:37 AM   Modules accepted: Orders

## 2012-09-08 ENCOUNTER — Ambulatory Visit (INDEPENDENT_AMBULATORY_CARE_PROVIDER_SITE_OTHER): Payer: 59 | Admitting: Internal Medicine

## 2012-09-08 ENCOUNTER — Encounter: Payer: Self-pay | Admitting: Internal Medicine

## 2012-09-08 VITALS — BP 104/68 | HR 76 | Temp 98.0°F | Resp 14 | Wt 126.5 lb

## 2012-09-08 DIAGNOSIS — R51 Headache: Secondary | ICD-10-CM

## 2012-09-08 DIAGNOSIS — E785 Hyperlipidemia, unspecified: Secondary | ICD-10-CM

## 2012-09-08 DIAGNOSIS — F411 Generalized anxiety disorder: Secondary | ICD-10-CM

## 2012-09-08 MED ORDER — CLONAZEPAM 1 MG PO TABS: ORAL_TABLET | ORAL | Status: DC

## 2012-09-08 MED ORDER — CYCLOBENZAPRINE HCL 5 MG PO TABS: 5.0000 mg | ORAL_TABLET | Freq: Two times a day (BID) | ORAL | Status: DC | PRN

## 2012-09-08 NOTE — Progress Notes (Signed)
  Subjective:    Patient ID: Kaylee Pratt, female    DOB: 1955/10/03, 56 y.o.   MRN: [id number]  HPI Pt presents to clinic for followup of multiple medical problems. S/p lexapro dose increase without side effects. Possible mild improvement of anxiety. Job continues to contribute greatly to stress and she has formally requested change of position within the company. Continues to have intermittent migraine and tension Kaylee Pratt. Flexeril helps tension Kaylee Pratt. Reviewed elevated tchol and ldl. Trying to avoid prescription medication if possible. Has received flu vaccine this season.   Past Medical History  Diagnosis Date  . Osteoporosis   . Asthma   . Depression   . GERD (gastroesophageal reflux disease)   . Allergy    No past surgical history on file.  reports that she has never smoked. She has never used smokeless tobacco. She reports that she drinks alcohol. She reports that she does not use illicit drugs. family history includes Prostate cancer in her father.  There is no history of Other, and Sudden death, and Diabetes, and Heart attack, and Hyperlipidemia, and Hypertension, . No Known Allergies    Review of Systems see hpi     Objective:   Physical Exam  Nursing note and vitals reviewed. Constitutional: She appears well-developed and well-nourished. No distress.  Neurological: She is alert.  Skin: She is not diaphoretic.  Psychiatric: She has a normal mood and affect. Her behavior is normal.          Assessment & Plan:

## 2012-09-08 NOTE — Assessment & Plan Note (Signed)
rf flexeril for tension Lillie component

## 2012-09-08 NOTE — Assessment & Plan Note (Signed)
Low fat diet/exericise. Attempt otc red yeast extract and oatmeal.

## 2012-09-08 NOTE — Patient Instructions (Addendum)
Please try over the counter red yeast extract for your cholesterol. Eating oats or oatmeal can help as well.

## 2012-09-08 NOTE — Assessment & Plan Note (Signed)
Mild improvement. Continue lexapro and refill klonopin. Attempting job position change.

## 2012-10-25 ENCOUNTER — Other Ambulatory Visit: Payer: Self-pay

## 2012-11-04 ENCOUNTER — Ambulatory Visit (INDEPENDENT_AMBULATORY_CARE_PROVIDER_SITE_OTHER): Payer: 59 | Admitting: Internal Medicine

## 2012-11-04 ENCOUNTER — Encounter: Payer: Self-pay | Admitting: Internal Medicine

## 2012-11-04 VITALS — BP 126/84 | HR 80 | Temp 98.1°F | Wt 125.0 lb

## 2012-11-04 DIAGNOSIS — J069 Acute upper respiratory infection, unspecified: Secondary | ICD-10-CM

## 2012-11-04 DIAGNOSIS — R51 Headache: Secondary | ICD-10-CM

## 2012-11-04 MED ORDER — CYCLOBENZAPRINE HCL 5 MG PO TABS: 5.0000 mg | ORAL_TABLET | Freq: Two times a day (BID) | ORAL | Status: DC | PRN

## 2012-11-04 MED ORDER — FLUTICASONE PROPIONATE 50 MCG/ACT NA SUSP: 2.0000 | Freq: Every day | NASAL | Status: DC

## 2012-11-04 MED ORDER — AMOXICILLIN 500 MG PO CAPS: 1000.0000 mg | ORAL_CAPSULE | Freq: Two times a day (BID) | ORAL | Status: DC

## 2012-11-04 NOTE — Progress Notes (Signed)
  Subjective:    Patient ID: Kaylee Pratt, female    DOB: 1955-11-18, 57 y.o.   MRN: [id number]  HPI Acute visit One-week history of nose congestion, cough, sore throat. Taking Claritin-D, Robitussin-DM without much help.  Past Medical History  Diagnosis Date  . Osteoporosis   . Asthma   . Depression   . GERD (gastroesophageal reflux disease)   . Allergy    No past surgical history on file.   Review of Systems On and off subjective fevers, no chills. Very small amount of sputum production, off-color. Her asthma has been well-controlled up to last few days when she required a few doses of albuterol. She reports has a hard time tolerating Qvar, it made her nervous, take it usually once a day. She has a long history of headaches, at baseline, request a refill and Flexeril.    Objective:   Physical Exam General -- alert, well-developed  HEENT -- TMs : wax bilaterally; throat w/o redness, face symmetric and slt tender to palpation at maxillary sinuses, nose slt congested  Lungs -- normal respiratory effort, no intercostal retractions, no accessory muscle use, and few end exp wheezes Heart-- normal rate, regular rhythm, no murmur, and no gallop.    Psych-- Cognition and judgment appear intact. Alert and cooperative with normal attention span and concentration.  not anxious appearing and not depressed appearing.       Assessment & Plan:   URI is very mild asthma exacerbation. Prescribed antibiotics, add Flonase. Encouraged to discuss  intolerance to Qvar with her allergist , in the meantime to take at it  least one puff of qvar a day.

## 2012-11-04 NOTE — Patient Instructions (Addendum)
Rest, fluids , tylenol For cough, take Mucinex DM twice a day as needed  For congestion use flonase, a nasal spray once a day   For  asthma, try to take Qvar at least once a day, albuterol as needed only if coughing or wheezing. Take the antibiotic as prescribed  (Amoxicillin) Call if no better in few days Call anytime if the symptoms are severe  --- Also, call  Dr. Lattimore Callas about the Qvar issue

## 2012-11-04 NOTE — Assessment & Plan Note (Signed)
Refilled Flexeril.

## 2012-11-07 ENCOUNTER — Ambulatory Visit: Payer: 59 | Admitting: Family

## 2012-12-08 ENCOUNTER — Ambulatory Visit: Payer: 59 | Admitting: Family Medicine

## 2012-12-09 ENCOUNTER — Ambulatory Visit: Payer: 59 | Admitting: Family Medicine

## 2012-12-16 ENCOUNTER — Ambulatory Visit: Payer: 59 | Admitting: Family Medicine

## 2012-12-19 ENCOUNTER — Ambulatory Visit (INDEPENDENT_AMBULATORY_CARE_PROVIDER_SITE_OTHER): Payer: 59 | Admitting: Family Medicine

## 2012-12-19 ENCOUNTER — Encounter: Payer: Self-pay | Admitting: Family Medicine

## 2012-12-19 ENCOUNTER — Encounter: Payer: Self-pay | Admitting: Lab

## 2012-12-19 VITALS — BP 110/60 | HR 61 | Temp 98.0°F | Ht 60.0 in | Wt 124.0 lb

## 2012-12-19 DIAGNOSIS — M810 Age-related osteoporosis without current pathological fracture: Secondary | ICD-10-CM

## 2012-12-19 DIAGNOSIS — F329 Major depressive disorder, single episode, unspecified: Secondary | ICD-10-CM

## 2012-12-19 DIAGNOSIS — J309 Allergic rhinitis, unspecified: Secondary | ICD-10-CM

## 2012-12-19 DIAGNOSIS — K219 Gastro-esophageal reflux disease without esophagitis: Secondary | ICD-10-CM

## 2012-12-19 MED ORDER — LORATADINE-PSEUDOEPHEDRINE ER 10-240 MG PO TB24: 1.0000 | ORAL_TABLET | Freq: Every day | ORAL | Status: DC

## 2012-12-19 MED ORDER — ALBUTEROL SULFATE HFA 108 (90 BASE) MCG/ACT IN AERS: 2.0000 | INHALATION_SPRAY | Freq: Four times a day (QID) | RESPIRATORY_TRACT | Status: DC | PRN

## 2012-12-19 NOTE — Progress Notes (Signed)
  Subjective:    Patient ID: Kaylee Pratt, female    DOB: 02/09/1956, 57 y.o.   MRN: [id number]  HPI New to establish.  Previously seeing Dr Artist Pais, Rodena Medin.  No GYN.  UTD on mammo, DEXA, colonoscopy (2009)  Osteoporosis- chronic problem, UTD on DEXA.  Has had Reclast x3 yrs due to GI issues w/ oral bisphosphonates.  Also was on Forteo but this caused HAs.  Interested in [facility].  Asthma- chronic problem, pt reports sxs have improved w/ age.  Triggered by perfumes, strong scents.  Will use Qvar as needed.  No longer has albuterol rescue inhaler.  No current SOB, wheezing, chest tightness.  Takes Claritin and xyzal.    Depression/anxiety- chronic problem, on Lexapro 20mg  daily.  This was increased due to inability to sleep or eat.  Taking klonopin prn- mostly for sleep.  Attempting to exercise as a stress outlet.  Trying to wean back to 10mg .  Pt was having difficulties at work but now has a new boss and she is hopeful that things will improve.  GERD- chronic problem, is taking Protonix daily.  Pt is not sure if this is safe to take daily.   Review of Systems For ROS see HPI     Objective:   Physical Exam  Vitals reviewed. Constitutional: She is oriented to person, place, and time. She appears well-developed and well-nourished. No distress.  HENT:  Head: Normocephalic and atraumatic.  Eyes: Conjunctivae and EOM are normal. Pupils are equal, round, and reactive to light.  Neck: Normal range of motion. Neck supple. No thyromegaly present.  Cardiovascular: Normal rate, regular rhythm, normal heart sounds and intact distal pulses.   No murmur heard. Pulmonary/Chest: Effort normal and breath sounds normal. No respiratory distress.  Abdominal: Soft. She exhibits no distension. There is no tenderness.  Musculoskeletal: She exhibits no edema.  Lymphadenopathy:    She has no cervical adenopathy.  Neurological: She is alert and oriented to person, place, and time.  Skin: Skin is warm and dry.   Psychiatric: She has a normal mood and affect. Her behavior is normal.  Rapid, almost pressured speech          Assessment & Plan:

## 2012-12-19 NOTE — Assessment & Plan Note (Signed)
New to provider, ongoing for pt.  Will decrease PPI use to every other day in attempt to wean.  Pt expressed understanding and is in agreement w/ plan.

## 2012-12-19 NOTE — Patient Instructions (Addendum)
Schedule your complete physical at your convenience Start the Claritin D daily STOP the xyzal Use the Albuterol as needed Call with any questions or concerns Welcome!  We're glad to have you!!

## 2012-12-19 NOTE — Assessment & Plan Note (Signed)
New to provider, ongoing for pt.  Discussed that taking both Claritin and Xyzal was duplicate therapy.  D/c Xyzal as pt feels claritin works better.  Script provided.

## 2012-12-19 NOTE — Assessment & Plan Note (Signed)
New to provider, ongoing for pt.  She would like to try Prolia.  Will attempt to verify insurance benefits.

## 2012-12-19 NOTE — Assessment & Plan Note (Signed)
New to provider, ongoing for pt.  Pt is attempting to wean lexapro from 20mg  to 10mg .  At time of next refill would like the 10mg  tabs rather than the 20mg .  Will follow closely.

## 2012-12-26 ENCOUNTER — Telehealth: Payer: Self-pay | Admitting: Internal Medicine

## 2012-12-26 ENCOUNTER — Other Ambulatory Visit: Payer: Self-pay | Admitting: Internal Medicine

## 2012-12-26 NOTE — Telephone Encounter (Signed)
Pt called to ask for a refill for cyclobenzaprine and to [facility]

## 2012-12-26 NOTE — Telephone Encounter (Signed)
Refill done.  

## 2012-12-29 NOTE — Telephone Encounter (Signed)
Pt was seen for OV by Dr. Drue Novel and med was refilled.//AB/CMA

## 2012-12-31 ENCOUNTER — Ambulatory Visit: Payer: 59 | Admitting: Family Medicine

## 2013-02-19 ENCOUNTER — Telehealth: Payer: Self-pay | Admitting: General Practice

## 2013-02-19 NOTE — Telephone Encounter (Signed)
Prolia paperwork faxed on 02/19/2013.

## 2013-02-27 ENCOUNTER — Other Ambulatory Visit: Payer: Self-pay | Admitting: Internal Medicine

## 2013-02-27 NOTE — Telephone Encounter (Signed)
Ok to refill? Last OV 2.25.14 Last filled 4.18.14

## 2013-03-26 ENCOUNTER — Telehealth: Payer: Self-pay | Admitting: *Deleted

## 2013-03-26 NOTE — Telephone Encounter (Signed)
Spoke with the pt on (03-25-13) and informed her that the Prolia was approved and explained to her that she will be responsible for remainder of $500.00 deductible & 20% of cost $171.40 ($28.57 per month).  Also informed the pt that she will need bloodwork(BMP) to check her levels before she can get the shot.   Pt agreed.  Pt is scheduled for CPE (05-06-13) and we will discuss more when she comes in.//AB/CMA

## 2013-03-30 ENCOUNTER — Encounter: Payer: Self-pay | Admitting: Family Medicine

## 2013-03-30 ENCOUNTER — Encounter: Payer: Self-pay | Admitting: Gastroenterology

## 2013-03-30 ENCOUNTER — Ambulatory Visit (INDEPENDENT_AMBULATORY_CARE_PROVIDER_SITE_OTHER): Payer: 59 | Admitting: Family Medicine

## 2013-03-30 VITALS — BP 130/80 | HR 75 | Temp 98.2°F | Ht 60.0 in | Wt 125.8 lb

## 2013-03-30 DIAGNOSIS — K625 Hemorrhage of anus and rectum: Secondary | ICD-10-CM

## 2013-03-30 DIAGNOSIS — Z1331 Encounter for screening for depression: Secondary | ICD-10-CM

## 2013-03-30 DIAGNOSIS — Z0000 Encounter for general adult medical examination without abnormal findings: Secondary | ICD-10-CM

## 2013-03-30 NOTE — Patient Instructions (Addendum)
Schedule a nurse visit for next week to get your Prolia injection We'll notify you of your lab results and make any changes if needed Someone will call you with your GI appt Add a stool softener Drink plenty of water Call with any questions or concerns Have a great summer!

## 2013-03-30 NOTE — Progress Notes (Signed)
  Subjective:    Patient ID: Kaylee Pratt, female    DOB: Oct 03, 1955, 57 y.o.   MRN: [id number]  HPI CPE- UTD on pap, mammo, DEXA, colonoscopy.  Rectal bleeding- only occuring w/ BMs, started in May after returning from vacation.  1st episode was large volume bleed w/ blood in bowel.  Bleeding stopped spontaneously.  Now when she has bleeding it is small and infrequent.  Hx of hemorrhoids.  Pt reports hard stools and straining.  Has seen [facility].   Review of Systems Patient reports no vision/ hearing changes, adenopathy,fever, weight change,  persistant/recurrent hoarseness , swallowing issues, chest pain, palpitations, edema, persistant/recurrent cough, hemoptysis, dyspnea (rest/exertional/paroxysmal nocturnal), abdominal pain, significant heartburn, GU symptoms (dysuria, hematuria, incontinence), Gyn symptoms (abnormal  bleeding, pain),  syncope, focal weakness, memory loss, numbness & tingling, skin/hair/nail changes, abnormal bruising, anxiety, or depression.     Objective:   Physical Exam  General Appearance:    Alert, cooperative, no distress, appears stated age  Head:    Normocephalic, without obvious abnormality, atraumatic  Eyes:    PERRL, conjunctiva/corneas clear, EOM's intact, fundi    benign, both eyes  Ears:    Normal TM's and external ear canals, both ears  Nose:   Nares normal, septum midline, mucosa normal, no drainage    or sinus tenderness  Throat:   Lips, mucosa, and tongue normal; teeth and gums normal  Neck:   Supple, symmetrical, trachea midline, no adenopathy;    Thyroid: no enlargement/tenderness/nodules  Back:     Symmetric, no curvature, ROM normal, no CVA tenderness  Lungs:     Clear to auscultation bilaterally, respirations unlabored  Chest Wall:    No tenderness or deformity   Heart:    Regular rate and rhythm, S1 and S2 normal, no murmur, rub   or gallop  Breast Exam:    No tenderness, masses, or nipple abnormality  Abdomen:     Soft, non-tender, bowel  sounds active all four quadrants,    no masses, no organomegaly  Genitalia:    Deferred due to recent exam  Rectal:    Large external hemorrhoid w/ oozing skin tag  Extremities:   Extremities normal, atraumatic, no cyanosis or edema  Pulses:   2+ and symmetric all extremities  Skin:   Skin color, texture, turgor normal, no rashes or lesions  Lymph nodes:   Cervical, supraclavicular, and axillary nodes normal  Neurologic:   CNII-XII intact, normal strength, sensation and reflexes    throughout          Assessment & Plan:

## 2013-03-31 ENCOUNTER — Encounter: Payer: Self-pay | Admitting: Family Medicine

## 2013-03-31 LAB — BASIC METABOLIC PANEL
BUN: 13 mg/dL (ref 6–23)
CO2: 27 mEq/L (ref 19–32)
Calcium: 9.2 mg/dL (ref 8.4–10.5)
Chloride: 104 mEq/L (ref 96–112)
Creatinine, Ser: 0.6 mg/dL (ref 0.4–1.2)
GFR: 103.7 mL/min (ref >60.00)
Glucose, Bld: 85 mg/dL (ref 70–99)
Potassium: 4 mEq/L (ref 3.5–5.1)
Sodium: 139 mEq/L (ref 135–145)

## 2013-03-31 LAB — CBC WITH DIFFERENTIAL/PLATELET
Basophils Absolute: 0 10*3/uL (ref 0.0–0.1)
Basophils Relative: 0.4 % (ref 0.0–3.0)
Eosinophils Absolute: 0.2 10*3/uL (ref 0.0–0.7)
Eosinophils Relative: 2.9 % (ref 0.0–5.0)
HCT: 39.5 % (ref 36.0–46.0)
Hemoglobin: 13.1 g/dL (ref 12.0–15.0)
Lymphocytes Relative: 24.7 % (ref 12.0–46.0)
Lymphs Abs: 2 10*3/uL (ref 0.7–4.0)
MCHC: 33.2 g/dL (ref 30.0–36.0)
MCV: 89.5 fl (ref 78.0–100.0)
Monocytes Absolute: 0.4 10*3/uL (ref 0.1–1.0)
Monocytes Relative: 4.4 % (ref 3.0–12.0)
Neutro Abs: 5.6 10*3/uL (ref 1.4–7.7)
Neutrophils Relative %: 67.6 % (ref 43.0–77.0)
Platelets: 302 10*3/uL (ref 150.0–400.0)
RBC: 4.41 Mil/uL (ref 3.87–5.11)
RDW: 13.7 % (ref 11.5–14.6)
WBC: 8.3 10*3/uL (ref 4.5–10.5)

## 2013-03-31 LAB — LDL CHOLESTEROL, DIRECT: Direct LDL: 148.8 mg/dL

## 2013-03-31 LAB — HEPATIC FUNCTION PANEL
ALT: 28 U/L (ref 0–35)
AST: 22 U/L (ref 0–37)
Albumin: 4 g/dL (ref 3.5–5.2)
Alkaline Phosphatase: 62 U/L (ref 39–117)
Bilirubin, Direct: 0 mg/dL (ref 0.0–0.3)
Total Bilirubin: 0.5 mg/dL (ref 0.3–1.2)
Total Protein: 7.9 g/dL (ref 6.0–8.3)

## 2013-03-31 LAB — LIPID PANEL
Cholesterol: 233 mg/dL — ABNORMAL HIGH (ref 0–200)
HDL: 53.8 mg/dL (ref >39.00)
Total CHOL/HDL Ratio: 4
Triglycerides: 139 mg/dL (ref 0.0–149.0)
VLDL: 27.8 mg/dL (ref 0.0–40.0)

## 2013-03-31 LAB — TSH: TSH: 0.56 u[IU]/mL (ref 0.35–5.50)

## 2013-03-31 NOTE — Assessment & Plan Note (Signed)
Pt's PE WNL.  UTD on health maintenance.  Check labs.  Anticipatory guidance provided.  

## 2013-03-31 NOTE — Assessment & Plan Note (Signed)
New.  Suspect this is due to pt's external hemorrhoid/skin tag.  Encouraged increased fluids, stool softeners.  Refer to GI for complete evaluation and possible tx.  Pt expressed understanding and is in agreement w/ plan.

## 2013-04-02 ENCOUNTER — Encounter: Payer: Self-pay | Admitting: *Deleted

## 2013-04-03 LAB — VITAMIN D 1,25 DIHYDROXY
Vitamin D 1, 25 (OH)2 Total: 67 pg/mL (ref 18–72)
Vitamin D2 1, 25 (OH)2: <8 pg/mL
Vitamin D3 1, 25 (OH)2: 67 pg/mL

## 2013-04-09 ENCOUNTER — Encounter: Payer: Self-pay | Admitting: Gastroenterology

## 2013-04-09 ENCOUNTER — Ambulatory Visit: Payer: 59

## 2013-04-09 ENCOUNTER — Ambulatory Visit (INDEPENDENT_AMBULATORY_CARE_PROVIDER_SITE_OTHER): Payer: 59 | Admitting: Gastroenterology

## 2013-04-09 VITALS — BP 110/70 | HR 80 | Ht 60.0 in | Wt 124.4 lb

## 2013-04-09 DIAGNOSIS — K625 Hemorrhage of anus and rectum: Secondary | ICD-10-CM

## 2013-04-09 DIAGNOSIS — K219 Gastro-esophageal reflux disease without esophagitis: Secondary | ICD-10-CM

## 2013-04-09 DIAGNOSIS — K649 Unspecified hemorrhoids: Secondary | ICD-10-CM

## 2013-04-09 MED ORDER — OMEPRAZOLE 40 MG PO CPDR: 40.0000 mg | DELAYED_RELEASE_CAPSULE | Freq: Every day | ORAL | Status: DC

## 2013-04-09 MED ORDER — NA SULFATE-K SULFATE-MG SULF 17.5-3.13-1.6 GM/177ML PO SOLN: ORAL | Status: DC

## 2013-04-09 MED ORDER — PRAMOXINE-HC 1-1 % EX CREA: TOPICAL_CREAM | Freq: Every day | CUTANEOUS | Status: DC

## 2013-04-09 NOTE — Patient Instructions (Addendum)
  You have been scheduled for an endoscopy and colonoscopy with propofol. Please follow the written instructions given to you at your visit today. Please pick up your prep at the pharmacy within the next 1-3 days. If you use inhalers (even only as needed), please bring them with you on the day of your procedure. Your physician has requested that you go to www.startemmi.com and enter the access code given to you at your visit today. This web site gives a general overview about your procedure. However, you should still follow specific instructions given to you by our office regarding your preparation for the procedure.  We have sent the following medications to your pharmacy for you to pick up at your convenience: Omeprazole 40 mg, please take one tablet by mouth once daily Analpram, please use as directed at bedtime  Please purchase Benefiber over the counter and take one tablespoon by mouth twice daily(Please stop Benefiber 5 days before procedure) _____________________________________________________________________________________________________________________________________                                               We are excited to introduce MyChart, a new best-in-class service that provides you online access to important information in your electronic medical record. We want to make it easier for you to view your health information - all in one secure location - when and where you need it. We expect MyChart will enhance the quality of care and service we provide.  When you register for MyChart, you can:    View your test results.    Request appointments and receive appointment reminders via email.    Request medication renewals.    View your medical history, allergies, medications and immunizations.    Communicate with your physician's office through a password-protected site.    Conveniently print information such as your medication lists.  To find out if MyChart is right  for you, please talk to a member of our clinical staff today. We will gladly answer your questions about this free health and wellness tool.  If you are age 56 or older and want a member of your family to have access to your record, you must provide written consent by completing a proxy form available at our office. Please speak to our clinical staff about guidelines regarding accounts for patients younger than age 56.  As you activate your MyChart account and need any technical assistance, please call the MyChart technical support line at [phone]) or email your question to [email or web address]. If you email your question(s), please include your name, a return phone number and the best time to reach you.  If you have non-urgent health-related questions, you can send a message to our office through MyChart at [email or web address]. If you have a medical emergency, call 911.  Thank you for using MyChart as your new health and wellness resource!   MyChart licensed from [employer],  4540-9811. Patents Pending.

## 2013-04-09 NOTE — Progress Notes (Signed)
History of Present Illness:  This is a 57 year old Asian-American who I previously saw in 2008 for rectal bleeding she had a negative colonoscopy.  That time she also had acid reflux and had a normal endoscopy.  She was on PPI medication at fairly good control of her acid reflux, but recently this medication was discontinued she's had recurrence of her reflux with some solid food dysphagia in a constant globus sensation in her throat.  Also for the last 3 months she's had increasing constipation with bright red blood per rectum.  She denies rectal or abdominal pain, or any family history of colon cancer or other malignancies.  Her appetite is good her weight is stable.  She denies hepatobiliary or systemic complaints.  She does have a history of anxiety disorders on Klonopin daily.  She denies any gynecologic problems.  I have reviewed this patient's present history, medical and surgical past history, allergies and medications.     ROS:   All systems were reviewed and are negative unless otherwise stated in the HPI.    Physical Exam: At pressure 110/70, pulse 80 and regular, and weight 124 with a BMI of 24.29. General well developed well nourished patient in no acute distress, appearing their stated age Eyes PERRLA, no icterus, fundoscopic exam per opthamologist Skin no lesions noted Neck supple, no adenopathy, no thyroid enlargement, no tenderness Chest clear to percussion and auscultation Heart no significant murmurs, gallops or rubs noted Abdomen no hepatosplenomegaly masses or tenderness, BS normal.  Rectal inspection normal no fissures, or fistulae noted.  No masses or tenderness on digital exam. Stool guaiac negative.  Posterior skin tag with mixed posterior hemorrhoid. Extremities no acute joint lesions, edema, phlebitis or evidence of cellulitis. Neurologic patient oriented x 3, cranial nerves intact, no focal neurologic deficits noted. Psychological mental status normal and normal  affect.  Assessment and plan: GERD recurrence with discontinuation of PPI therapy.  She does have some dysphagia, and I've schedule her for followup endoscopy and possible dilation.  I suspect her rectal bleeding is from her mixed hemorrhoids which is seen posteriorly, and she may be a good candidate for rubber band ligation.  We will repeat her colonoscopy to exclude other abnormalities such as colonic polyposis.  I placed her on a high fiber diet with Benefiber twice a day, liberal by mouth fluids, Sitz mass at bedtime with Analpram cream, have started Dexilant 60 mg a day along with reflux maneuvers, and advised to continue other medications as per primary care.  Review of labs shows normal CBC and metabolic profile.

## 2013-04-09 NOTE — Addendum Note (Signed)
Addended by: Ok Anis A on: 04/09/2013 11:17 AM   Modules accepted: Orders

## 2013-04-14 ENCOUNTER — Ambulatory Visit: Payer: 59

## 2013-04-15 ENCOUNTER — Ambulatory Visit (AMBULATORY_SURGERY_CENTER): Payer: 59 | Admitting: Gastroenterology

## 2013-04-15 ENCOUNTER — Encounter: Payer: Self-pay | Admitting: Gastroenterology

## 2013-04-15 VITALS — BP 119/75 | HR 78 | Temp 98.8°F | Resp 25 | Ht 60.0 in | Wt 124.0 lb

## 2013-04-15 DIAGNOSIS — K625 Hemorrhage of anus and rectum: Secondary | ICD-10-CM

## 2013-04-15 DIAGNOSIS — Z1211 Encounter for screening for malignant neoplasm of colon: Secondary | ICD-10-CM

## 2013-04-15 DIAGNOSIS — K219 Gastro-esophageal reflux disease without esophagitis: Secondary | ICD-10-CM

## 2013-04-15 DIAGNOSIS — K649 Unspecified hemorrhoids: Secondary | ICD-10-CM

## 2013-04-15 MED ORDER — SODIUM CHLORIDE 0.9 % IV SOLN: 500.0000 mL | INTRAVENOUS | Status: DC

## 2013-04-15 NOTE — Progress Notes (Signed)
Patient did not experience any of the following events: a burn prior to discharge; a fall within the facility; wrong site/side/patient/procedure/implant event; or a hospital transfer or hospital admission upon discharge from the facility. (G8907) Patient did not have preoperative order for IV antibiotic SSI prophylaxis. (G8918)  

## 2013-04-15 NOTE — Patient Instructions (Addendum)

## 2013-04-15 NOTE — Op Note (Signed)
[facility] [address] [place], [address]   COLONOSCOPY PROCEDURE REPORT  PATIENT: Kaylee Pratt, Kaylee Pratt  MR#: [id number] BIRTHDATE: Jun 15, 1956 , 57  yrs. old GENDER: Female ENDOSCOPIST: Mardella Layman, MD, [facility] REFERRED KG:[id number] Beverely Low, M.D.  Sheliah Hatch, M.D. PROCEDURE DATE:  04/15/2013 PROCEDURE:   Colonoscopy, screening First Screening Colonoscopy - Avg.  risk and is 50 yrs.  old or older - No. ASA CLASS:   Class II INDICATIONS:Rectal Bleeding. MEDICATIONS: propofol (Diprivan) 150mg  IV  DESCRIPTION OF PROCEDURE:   After the risks benefits and alternatives of the procedure were thoroughly explained, informed consent was obtained.  A digital rectal exam revealed internal hemorrhoids.   The LB [id number]  endoscope was introduced through the anus and advanced to the cecum, which was identified by both the appendix and ileocecal valve. No adverse events experienced.   The quality of the prep was excellent, using MoviPrep  The instrument was then slowly withdrawn as the colon was fully examined.      COLON FINDINGS: A normal appearing cecum, ileocecal valve, and appendiceal orifice were identified.  The ascending, hepatic flexure, transverse, splenic flexure, descending, sigmoid colon and rectum appeared unremarkable.  No polyps or cancers were seen. Retroflexed views revealed internal/external hemorrhoids Large.posterior skin tag noted with mixed hemorrhoids. The time to cecum=4 minutes 13 seconds.  Withdrawal time=6 minutes 0 seconds. The scope was withdrawn and the procedure completed. COMPLICATIONS: There were no complications.  ENDOSCOPIC IMPRESSION: Normal colon..posterior hemorrhoid.  RECOMMENDATIONS: 1.  Continue current medications 2.  Metamucil or benefiber 3.  Continue current colorectal screening recommendations for "routine risk" patients with a repeat colonoscopy in 10 years. 4.  consider hemorrhoid banding as  outpatient   eSigned:  Mardella Layman, MD, [facility] 04/15/2013 3:41 PM   cc:   PATIENT NAME:  Kaylee Pratt, Kaylee Pratt MR#: [id number]

## 2013-04-15 NOTE — Op Note (Signed)
[facility] [address] [place], [address]   ENDOSCOPY PROCEDURE REPORT  PATIENT: Kaylee Pratt, Kaylee Pratt  MR#: [id number] BIRTHDATE: Dec 03, 1955 , 57  yrs. old GENDER: Female ENDOSCOPIST:Kacia Halley Hale Bogus, MD, [facility] REFERRED BY: Neena Rhymes, M.D. PROCEDURE DATE:  04/15/2013 PROCEDURE:   EGD w/ biopsy for H.pylori ASA CLASS:    Class II INDICATIONS: Chest pain and history of GERD. MEDICATION: There was residual sedation effect present from prior procedure and propofol (Diprivan) 100mg  IV TOPICAL ANESTHETIC:  DESCRIPTION OF PROCEDURE:   After the risks and benefits of the procedure were explained, informed consent was obtained.  The endoscope  endoscope was introduced through the mouth  and advanced to the second portion of the duodenum .  The instrument was slowly withdrawn as the mucosa was fully examined.      DUODENUM: The duodenal mucosa showed no abnormalities in the bulb and second portion of the duodenum.  STOMACH: The mucosa of the stomach appeared normal.  A CLO biopsy was performed.  ESOPHAGUS: A 4 cm hiatal hernia was noted.   The mucosa of the esophagus appeared normal.No  esophageal stricture or erosive esophagitis noted  Retroflexed views revealed a hiatal hernia. The scope was then withdrawn from the patient and the procedure completed.  COMPLICATIONS: There were no complications.   ENDOSCOPIC IMPRESSION: 1.   The duodenal mucosa showed no abnormalities in the bulb and second portion of the duodenum 2.   The mucosa of the stomach appeared normal; biopsy for H.pylori done... 3.   4 cm hiatal hernia and chronic GERD 4.   The mucosa of the esophagus appeared normal  RECOMMENDATIONS: 1.  Continue PPI 2.  Rx CLO if positive 3.  high-resolution manometry if dysphagia persists    _______________________________ eSigned:  Mardella Layman, MD, [facility] 04/15/2013 3:47 PM   standard discharge   PATIENT NAME:  Kaylee Pratt, Kaylee Pratt MR#: [id number]

## 2013-04-15 NOTE — Progress Notes (Signed)
Procedure ends, to recovery, report given and VSS. 

## 2013-04-15 NOTE — Progress Notes (Signed)
Called to room to assist during endoscopic procedure.  Patient ID and intended procedure confirmed with present staff. Received instructions for my participation in the procedure from the performing physician. ewm 

## 2013-04-16 ENCOUNTER — Other Ambulatory Visit: Payer: Self-pay | Admitting: Family Medicine

## 2013-04-16 LAB — HELICOBACTER PYLORI SCREEN-BIOPSY: UREASE: NEGATIVE

## 2013-04-17 ENCOUNTER — Encounter: Payer: Self-pay | Admitting: Gastroenterology

## 2013-04-17 ENCOUNTER — Telehealth: Payer: Self-pay | Admitting: *Deleted

## 2013-04-17 NOTE — Telephone Encounter (Signed)
Left message that we called for f/u 

## 2013-04-20 ENCOUNTER — Telehealth: Payer: Self-pay

## 2013-04-20 ENCOUNTER — Telehealth: Payer: Self-pay | Admitting: Family Medicine

## 2013-04-20 NOTE — Telephone Encounter (Signed)
Last  Refill:09-08-12 by Dr. Venita Sheffield Last OV:03-30-13(CPE) No UDS Please advise.//AB/CMA

## 2013-04-20 NOTE — Telephone Encounter (Signed)
Message copied by [place], Nkosi Cortright E on Mon Apr 20, 2013  6:59 PM ------      Message from: Iva Boop      Created: Sun Apr 19, 2013  1:39 PM       She looks appropriate no blood thinners etc            We can call her and arrange an appointment to discuss the procedure etc and place first band if she wants                  ----- Message -----         From: Clista Rainford E [place], CMA         Sent: 04/17/2013   8:52 AM           To: Iva Boop, MD            She had colonoscopy with Dr. Brennan Bailey 04/15/13 and he wants her to get banding.  Do we need to have a certain wait after colon to do this?       ------

## 2013-04-20 NOTE — Telephone Encounter (Signed)
Patient is calling to request a 20-month supply of her Clonazepam Rx. States that she is coming in tomorrow for a shot and would like to pick up at this time.

## 2013-04-20 NOTE — Telephone Encounter (Signed)
Ok for #180 but needs to sign controlled substance agreement and then do UDS

## 2013-04-20 NOTE — Telephone Encounter (Signed)
Left message to call back to discuss appointment for consult and possible hemorrhoid banding.

## 2013-04-21 ENCOUNTER — Ambulatory Visit (INDEPENDENT_AMBULATORY_CARE_PROVIDER_SITE_OTHER): Payer: 59

## 2013-04-21 DIAGNOSIS — M810 Age-related osteoporosis without current pathological fracture: Secondary | ICD-10-CM

## 2013-04-21 MED ORDER — DENOSUMAB 60 MG/ML ~~LOC~~ SOLN
60.0000 mg | Freq: Once | SUBCUTANEOUS | Status: AC
  Administered 2013-04-21: 60 mg via SUBCUTANEOUS

## 2013-04-21 MED ORDER — CLONAZEPAM 1 MG PO TABS: ORAL_TABLET | ORAL | Status: DC

## 2013-04-21 NOTE — Telephone Encounter (Signed)
Spoke to patient and made appointment for consult/banding on 04/28/13 at 4:15pm with Dr. Stan Head.

## 2013-04-21 NOTE — Telephone Encounter (Signed)
Last refill:02-27-13 Last OV:03-30-13 Please advise.//AB/CMA

## 2013-04-21 NOTE — Telephone Encounter (Signed)
Patient came in for nurse visit and also filled out the contract and her urine was collected to screen. Patient give Rx at visit.

## 2013-04-28 ENCOUNTER — Ambulatory Visit: Payer: 59 | Admitting: Internal Medicine

## 2013-04-28 ENCOUNTER — Encounter: Payer: Self-pay | Admitting: Internal Medicine

## 2013-04-28 VITALS — BP 100/72 | HR 77 | Ht 60.0 in | Wt 125.0 lb

## 2013-04-28 DIAGNOSIS — K648 Other hemorrhoids: Secondary | ICD-10-CM

## 2013-04-28 NOTE — Progress Notes (Signed)
Patient ID: Kaylee Pratt, female   DOB: November 10, 1955, 57 y.o.   MRN: [id number]  This is a very nice lady with rectal bleeding. A colonoscopy by Dr. Artist Pais shown internal hemorrhoids. She desires treatment.  PROCEDURE NOTE: The patient presents with symptomatic grade 1-2 hemorrhoids,requesting rubber band ligation of her hemorrhoidal disease.  All risks, benefits and alternative forms of therapy were described and informed consent was obtained.  In the Left Lateral Decubitus position anal tags were seen, RA largest.  Anoscopic examination revealed grade 1-2 hemorrhoids in the RP>RA >LL positions. The decision was made to band the RP internal hemorrhoid, and the [facility] O'Regan System was used to perform band ligation without complication.  Digital anorectal examination was then performed to assure proper positioning of the band, and to adjust the banded tissue once.  The patient was discharged home without pain or other issues.  Dietary and behavioral recommendations were given (reduce time on commode, add Benefiber 2 tbsp daily), along with follow-up instructions.  The patient will return in  2 weeks for follow-up and possible additional banding as required. No complications were encountered and the patient tolerated the procedure well

## 2013-04-28 NOTE — Assessment & Plan Note (Signed)
Successful ligation of RP column today. To return 8/29 for reassessment/repeat ligation

## 2013-04-28 NOTE — Patient Instructions (Addendum)
HEMORRHOID BANDING PROCEDURE    FOLLOW-UP CARE   1. The procedure you have had should have been relatively painless since the banding of the area involved does not have nerve endings and there is no pain sensation.  The rubber band cuts off the blood supply to the hemorrhoid and the band may fall off as soon as 48 hours after the banding (the band may occasionally be seen in the toilet bowl following a bowel movement). You may notice a temporary feeling of fullness in the rectum which should respond adequately to plain Tylenol or Motrin.  2. Following the banding, avoid strenuous exercise that evening and resume full activity the next day.  A sitz bath (soaking in a warm tub) or bidet is soothing, and can be useful for cleansing the area after bowel movements.     3. To avoid constipation, take two tablespoons of natural wheat bran, natural oat bran, flax, Benefiber or any over the counter fiber supplement and increase your water intake to 7-8 glasses daily.    4. Unless you have been prescribed anorectal medication, do not put anything inside your rectum for two weeks: No suppositories, enemas, fingers, etc.  5. Occasionally, you may have more bleeding than usual after the banding procedure.  This is often from the untreated hemorrhoids rather than the treated one.  Don't be concerned if there is a tablespoon or so of blood.  If there is more blood than this, lie flat with your bottom higher than your head and apply an ice pack to the area. If the bleeding does not stop within a half an hour or if you feel faint, call our office at [phone] or go to the emergency room.  6. Problems are not common; however, if there is a substantial amount of bleeding, severe pain, chills, fever or difficulty passing urine (very rare) or other problems, you should call us at [phone] or report to the nearest emergency room.  7. Do not stay seated continuously for more than 2-3 hours for a day or two  after the procedure.  Tighten your buttock muscles 10-15 times every two hours and take 10-15 deep breaths every 1-2 hours.  Do not spend more than a few minutes on the toilet if you cannot empty your bowel; instead re-visit the toilet at a later time.    Today we have provided you with a fiber supplement handout to read and follow.  We have made you a follow up appointment for your next banding August 29th.  I appreciate the opportunity to care for you.

## 2013-05-06 ENCOUNTER — Encounter: Payer: 59 | Admitting: Family Medicine

## 2013-05-06 ENCOUNTER — Other Ambulatory Visit: Payer: Self-pay | Admitting: Family Medicine

## 2013-05-06 NOTE — Telephone Encounter (Signed)
Last seen 03/30/13 and filled 04/16/13 #30. Please advise      KP

## 2013-05-08 ENCOUNTER — Encounter: Payer: Self-pay | Admitting: Internal Medicine

## 2013-05-08 ENCOUNTER — Ambulatory Visit (INDEPENDENT_AMBULATORY_CARE_PROVIDER_SITE_OTHER): Payer: 59 | Admitting: Internal Medicine

## 2013-05-08 VITALS — BP 92/60 | HR 72 | Ht 60.0 in | Wt 125.0 lb

## 2013-05-08 DIAGNOSIS — K648 Other hemorrhoids: Secondary | ICD-10-CM

## 2013-05-08 NOTE — Progress Notes (Signed)
Patient ID: Kaylee Pratt, female   DOB: 13-Apr-1956, 57 y.o.   MRN: [id number]  PROCEDURE NOTE: The patient presents with symptomatic grade 1-2 hemorrhoids, requesting rubber band ligation of his/her hemorrhoidal disease.  All risks, benefits and alternative forms of therapy were described and informed consent was obtained. She is s/p banding of the RP pile about 11 days ago. She has done well.   The decision was made to band the RA internal hemorrhoid, and the [facility] O'Regan System was used to perform band ligation without complication.  Digital anorectal examination was then performed to assure proper positioning of the band, and to adjust the banded tissue as required.  The patient was discharged home without pain or other issues.  Dietary and behavioral recommendations were given and (if necessary - prescriptions were given), along with follow-up instructions.  The patient will return in 2 weeks for follow-up and possible additional banding as required. No complications were encountered and the patient tolerated the procedure well.

## 2013-05-08 NOTE — Assessment & Plan Note (Signed)
RA ligated today To return in September for LL ligation

## 2013-05-08 NOTE — Patient Instructions (Addendum)
HEMORRHOID BANDING PROCEDURE    FOLLOW-UP CARE   1. The procedure you have had should have been relatively painless since the banding of the area involved does not have nerve endings and there is no pain sensation.  The rubber band cuts off the blood supply to the hemorrhoid and the band may fall off as soon as 48 hours after the banding (the band may occasionally be seen in the toilet bowl following a bowel movement). You may notice a temporary feeling of fullness in the rectum which should respond adequately to plain Tylenol or Motrin.  2. Following the banding, avoid strenuous exercise that evening and resume full activity the next day.  A sitz bath (soaking in a warm tub) or bidet is soothing, and can be useful for cleansing the area after bowel movements.     3. To avoid constipation, take two tablespoons of natural wheat bran, natural oat bran, flax, Benefiber or any over the counter fiber supplement and increase your water intake to 7-8 glasses daily.    4. Unless you have been prescribed anorectal medication, do not put anything inside your rectum for two weeks: No suppositories, enemas, fingers, etc.  5. Occasionally, you may have more bleeding than usual after the banding procedure.  This is often from the untreated hemorrhoids rather than the treated one.  Don't be concerned if there is a tablespoon or so of blood.  If there is more blood than this, lie flat with your bottom higher than your head and apply an ice pack to the area. If the bleeding does not stop within a half an hour or if you feel faint, call our office at [phone] or go to the emergency room.  6. Problems are not common; however, if there is a substantial amount of bleeding, severe pain, chills, fever or difficulty passing urine (very rare) or other problems, you should call us at [phone] or report to the nearest emergency room.  7. Do not stay seated continuously for more than 2-3 hours for a day or two  after the procedure.  Tighten your buttock muscles 10-15 times every two hours and take 10-15 deep breaths every 1-2 hours.  Do not spend more than a few minutes on the toilet if you cannot empty your bowel; instead re-visit the toilet at a later time.      I appreciate the opportunity to care for you.         

## 2013-05-21 ENCOUNTER — Encounter: Payer: Self-pay | Admitting: Family Medicine

## 2013-06-01 ENCOUNTER — Ambulatory Visit (INDEPENDENT_AMBULATORY_CARE_PROVIDER_SITE_OTHER): Payer: 59 | Admitting: Internal Medicine

## 2013-06-01 VITALS — BP 114/68 | HR 84 | Ht 60.0 in | Wt 129.4 lb

## 2013-06-01 DIAGNOSIS — K648 Other hemorrhoids: Secondary | ICD-10-CM

## 2013-06-01 DIAGNOSIS — K219 Gastro-esophageal reflux disease without esophagitis: Secondary | ICD-10-CM

## 2013-06-01 MED ORDER — OMEPRAZOLE 40 MG PO CPDR: 40.0000 mg | DELAYED_RELEASE_CAPSULE | Freq: Every day | ORAL | Status: DC

## 2013-06-01 NOTE — Patient Instructions (Signed)
HEMORRHOID BANDING PROCEDURE    FOLLOW-UP CARE   1. The procedure you have had should have been relatively painless since the banding of the area involved does not have nerve endings and there is no pain sensation.  The rubber band cuts off the blood supply to the hemorrhoid and the band may fall off as soon as 48 hours after the banding (the band may occasionally be seen in the toilet bowl following a bowel movement). You may notice a temporary feeling of fullness in the rectum which should respond adequately to plain Tylenol or Motrin.  2. Following the banding, avoid strenuous exercise that evening and resume full activity the next day.  A sitz bath (soaking in a warm tub) or bidet is soothing, and can be useful for cleansing the area after bowel movements.     3. To avoid constipation, take two tablespoons of natural wheat bran, natural oat bran, flax, Benefiber or any over the counter fiber supplement and increase your water intake to 7-8 glasses daily.    4. Unless you have been prescribed anorectal medication, do not put anything inside your rectum for two weeks: No suppositories, enemas, fingers, etc.  5. Occasionally, you may have more bleeding than usual after the banding procedure.  This is often from the untreated hemorrhoids rather than the treated one.  Don't be concerned if there is a tablespoon or so of blood.  If there is more blood than this, lie flat with your bottom higher than your head and apply an ice pack to the area. If the bleeding does not stop within a half an hour or if you feel faint, call our office at [phone] or go to the emergency room.  6. Problems are not common; however, if there is a substantial amount of bleeding, severe pain, chills, fever or difficulty passing urine (very rare) or other problems, you should call us at [phone] or report to the nearest emergency room.  7. Do not stay seated continuously for more than 2-3 hours for a day or two  after the procedure.  Tighten your buttock muscles 10-15 times every two hours and take 10-15 deep breaths every 1-2 hours.  Do not spend more than a few minutes on the toilet if you cannot empty your bowel; instead re-visit the toilet at a later time.   I appreciate the opportunity to care for you. Iva Boop, MD, [facility]

## 2013-06-01 NOTE — Assessment & Plan Note (Signed)
Successful ligation LL pile Return as needed

## 2013-06-01 NOTE — Progress Notes (Signed)
Patient ID: Kaylee Pratt, female   DOB: 1956/04/13, 57 y.o.   MRN: [id number]   PROCEDURE NOTE: The patient presents with symptomatic grade 1-2 hemorrhoids requesting rubber band ligation of her hemorrhoidal disease.  All risks, benefits and alternative forms of therapy were described and informed consent was obtained.  She has had significant reduction in bleeding after prior banding of RA and RP piles.  The decision was made to band the LL internal hemorrhoid, and the [facility] O'Regan System was used to perform band ligation without complication.  Digital anorectal examination was then performed to assure proper positioning of the band, and to adjust the banded tissue as required.  The patient was discharged home without pain or other issues.    The patient will return as needed for follow-up and possible additional banding as required. No complications were encountered and the patient tolerated the procedure well.  I refilled omeprazole for GERD also.

## 2013-06-01 NOTE — Assessment & Plan Note (Signed)
Continue PPI - refill PPI

## 2013-07-02 LAB — HM MAMMOGRAPHY: HM Mammogram: NORMAL

## 2013-07-07 ENCOUNTER — Encounter: Payer: Self-pay | Admitting: Family Medicine

## 2013-07-16 ENCOUNTER — Other Ambulatory Visit: Payer: Self-pay

## 2013-08-11 ENCOUNTER — Other Ambulatory Visit: Payer: Self-pay | Admitting: Internal Medicine

## 2013-08-11 NOTE — Telephone Encounter (Signed)
Received request for refill on pt's lexapro. Sent denial to pharmacy as pt is now seeing Dr Beverely Low for primary care and sent note to send request to Dr Beverely Low.

## 2013-08-28 ENCOUNTER — Telehealth: Payer: Self-pay | Admitting: General Practice

## 2013-08-28 NOTE — Telephone Encounter (Signed)
Called patient to inform that we received price information on Prolia injection from her insurance. Pt could possibly be liable for 20% of medication cost about $171. This can be split up into monthly payments. If patient is ok with this please let me know so i can order a prolia. Pt can be scheduled two weeks out.

## 2013-08-28 NOTE — Telephone Encounter (Signed)
Prolia ordered. JG//CMA

## 2013-08-28 NOTE — Telephone Encounter (Signed)
Can you please order a prolia for this pt?

## 2013-08-28 NOTE — Telephone Encounter (Signed)
Patient states that she is ok with her portion of the medication cost and would like for it to be ordered.

## 2013-08-31 ENCOUNTER — Other Ambulatory Visit: Payer: Self-pay | Admitting: Family Medicine

## 2013-09-01 ENCOUNTER — Ambulatory Visit (INDEPENDENT_AMBULATORY_CARE_PROVIDER_SITE_OTHER): Payer: 59 | Admitting: Family Medicine

## 2013-09-01 ENCOUNTER — Encounter: Payer: Self-pay | Admitting: Family Medicine

## 2013-09-01 VITALS — BP 120/76 | HR 73 | Temp 98.1°F | Resp 16 | Wt 124.0 lb

## 2013-09-01 DIAGNOSIS — E785 Hyperlipidemia, unspecified: Secondary | ICD-10-CM

## 2013-09-01 LAB — HEPATIC FUNCTION PANEL
ALT: 17 U/L (ref 0–35)
AST: 17 U/L (ref 0–37)
Albumin: 4 g/dL (ref 3.5–5.2)
Alkaline Phosphatase: 39 U/L (ref 39–117)
Bilirubin, Direct: 0 mg/dL (ref 0.0–0.3)
Total Bilirubin: 0.4 mg/dL (ref 0.3–1.2)
Total Protein: 7.1 g/dL (ref 6.0–8.3)

## 2013-09-01 LAB — LIPID PANEL
Cholesterol: 217 mg/dL — ABNORMAL HIGH (ref 0–200)
HDL: 45.4 mg/dL (ref >39.00)
Total CHOL/HDL Ratio: 5
Triglycerides: 207 mg/dL — ABNORMAL HIGH (ref 0.0–149.0)
VLDL: 41.4 mg/dL — ABNORMAL HIGH (ref 0.0–40.0)

## 2013-09-01 LAB — LDL CHOLESTEROL, DIRECT: Direct LDL: 143.2 mg/dL

## 2013-09-01 MED ORDER — ROSUVASTATIN CALCIUM 5 MG PO TABS: 5.0000 mg | ORAL_TABLET | Freq: Every day | ORAL | Status: DC

## 2013-09-01 NOTE — Patient Instructions (Signed)
Schedule a lab visit in 6-8 weeks to recheck LFTs (dx 272.4) Start the Crestor 5mg  daily We'll notify you of your lab results and make any changes if needed Call with any questions or concerns Happy Holidays!!

## 2013-09-01 NOTE — Progress Notes (Signed)
Pre visit review using our clinic review tool, if applicable. No additional management support is needed unless otherwise documented below in the visit note. 

## 2013-09-01 NOTE — Telephone Encounter (Signed)
Med filled.  

## 2013-09-01 NOTE — Progress Notes (Signed)
   Subjective:    Patient ID: Kaylee Pratt, female    DOB: 25-Feb-1956, 57 y.o.   MRN: [id number]  HPI Hyperlipidemia- chronic problem, pt has been taking Red Yeast Rice but this is not working.  Recent health fair showed elevated levels.  + family hx.  No abd pain, N/V, myalgias.   Review of Systems For ROS see HPI     Objective:   Physical Exam  Vitals reviewed. Constitutional: She is oriented to person, place, and time. She appears well-developed and well-nourished. No distress.  HENT:  Head: Normocephalic and atraumatic.  Eyes: Conjunctivae and EOM are normal. Pupils are equal, round, and reactive to light.  Neck: Normal range of motion. Neck supple. No thyromegaly present.  Cardiovascular: Normal rate, regular rhythm, normal heart sounds and intact distal pulses.   No murmur heard. Pulmonary/Chest: Effort normal and breath sounds normal. No respiratory distress.  Abdominal: Soft. She exhibits no distension. There is no tenderness.  Musculoskeletal: She exhibits no edema.  Lymphadenopathy:    She has no cervical adenopathy.  Neurological: She is alert and oriented to person, place, and time.  Skin: Skin is warm and dry.  Psychiatric: She has a normal mood and affect. Her behavior is normal.          Assessment & Plan:

## 2013-09-01 NOTE — Assessment & Plan Note (Signed)
Chronic problem.  Has been taking Red Yeast Rice but lipids have not been improving.  Start low dose Crestor, monitor for improvement.  Reviewed supportive care and red flags that should prompt return.  Pt expressed understanding and is in agreement w/ plan.

## 2013-09-23 ENCOUNTER — Ambulatory Visit (INDEPENDENT_AMBULATORY_CARE_PROVIDER_SITE_OTHER): Payer: 59 | Admitting: Family Medicine

## 2013-09-23 ENCOUNTER — Encounter: Payer: Self-pay | Admitting: Family Medicine

## 2013-09-23 VITALS — BP 128/80 | HR 80 | Temp 98.2°F | Resp 16 | Wt 120.0 lb

## 2013-09-23 DIAGNOSIS — F3289 Other specified depressive episodes: Secondary | ICD-10-CM

## 2013-09-23 DIAGNOSIS — Z0279 Encounter for issue of other medical certificate: Secondary | ICD-10-CM

## 2013-09-23 DIAGNOSIS — F329 Major depressive disorder, single episode, unspecified: Secondary | ICD-10-CM

## 2013-09-23 MED ORDER — CLONAZEPAM 1 MG PO TABS: ORAL_TABLET | ORAL | Status: DC

## 2013-09-23 MED ORDER — ESCITALOPRAM OXALATE 10 MG PO TABS: 10.0000 mg | ORAL_TABLET | Freq: Every day | ORAL | Status: DC

## 2013-09-23 NOTE — Progress Notes (Signed)
Pre visit review using our clinic review tool, if applicable. No additional management support is needed unless otherwise documented below in the visit note. 
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
## 2013-09-23 NOTE — Patient Instructions (Signed)
Follow up the first week of Feb to recheck mood Take the Lexapro 10mg  daily Use the klonopin as needed Please restart counseling Call me if you need me! Hang in there!!!
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
## 2013-09-29 ENCOUNTER — Telehealth: Payer: Self-pay | Admitting: *Deleted

## 2013-09-29 NOTE — Telephone Encounter (Signed)
Patient called and stated that she needs some paper that was given to Dr. Beverely Lowabori that needs to be faxed to her Human Resources @ [phone] Attn: Luiz BlarePenny Pratt. Patient still had 3 places on the paperwork that needed her signature. Left message for patient to let us know if she wants us to go ahead and fax the papers without her signing them.

## 2013-09-30 ENCOUNTER — Telehealth: Payer: Self-pay | Admitting: General Practice

## 2013-09-30 NOTE — Telephone Encounter (Signed)
The patient has been made aware and will come by the office to sign the paperwork. She has another form that needs to be signed as well and will drop that of so we can compare the two.     KP

## 2013-09-30 NOTE — Telephone Encounter (Signed)
Pt dropped off more FMLA forms given to Tabori. 2 sets of FMLA forms given to [facility] [employer] at front desk for pt signature and for billing.

## 2013-10-01 ENCOUNTER — Ambulatory Visit (INDEPENDENT_AMBULATORY_CARE_PROVIDER_SITE_OTHER): Payer: 59

## 2013-10-01 DIAGNOSIS — M810 Age-related osteoporosis without current pathological fracture: Secondary | ICD-10-CM

## 2013-10-01 MED ORDER — DENOSUMAB 60 MG/ML ~~LOC~~ SOLN
60.0000 mg | Freq: Once | SUBCUTANEOUS | Status: AC
  Administered 2013-10-01: 60 mg via SUBCUTANEOUS

## 2013-10-06 NOTE — Telephone Encounter (Signed)
Forms completed.  Again.

## 2013-10-06 NOTE — Telephone Encounter (Signed)
Forms faxed on 10/06/13

## 2013-10-14 ENCOUNTER — Encounter: Payer: Self-pay | Admitting: Family Medicine

## 2013-10-14 ENCOUNTER — Ambulatory Visit (INDEPENDENT_AMBULATORY_CARE_PROVIDER_SITE_OTHER): Payer: 59 | Admitting: Family Medicine

## 2013-10-14 VITALS — BP 126/82 | HR 66 | Temp 98.4°F | Resp 16 | Wt 118.4 lb

## 2013-10-14 DIAGNOSIS — F329 Major depressive disorder, single episode, unspecified: Secondary | ICD-10-CM

## 2013-10-14 DIAGNOSIS — F3289 Other specified depressive episodes: Secondary | ICD-10-CM

## 2013-10-14 MED ORDER — ESCITALOPRAM OXALATE 20 MG PO TABS: 20.0000 mg | ORAL_TABLET | Freq: Every day | ORAL | Status: DC

## 2013-10-14 NOTE — Patient Instructions (Signed)
Follow up the week of 2/23 to recheck mood Increase the Lexapro to 20mg  daily- 2 of what you currently have and 1 of the new prescription Continue your counseling weekly- this is very important IF you have thoughts of harming yourself, please call the crisis hotline at [phone] Call with any questions or concerns Hang in there!  This is not your fault!
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
## 2013-10-14 NOTE — Progress Notes (Signed)
Pre visit review using our clinic review tool, if applicable. No additional management support is needed unless otherwise documented below in the visit note. 

## 2013-10-16 ENCOUNTER — Encounter: Payer: Self-pay | Admitting: General Practice

## 2013-10-19 ENCOUNTER — Telehealth: Payer: Self-pay | Admitting: *Deleted

## 2013-10-19 ENCOUNTER — Encounter: Payer: Self-pay | Admitting: General Practice

## 2013-10-19 NOTE — Telephone Encounter (Signed)
Pt came by today stating [employer] needs additional info to process her claim to extend disability benefits.

## 2013-10-19 NOTE — Telephone Encounter (Signed)
Please advise. Thanks.  

## 2013-10-19 NOTE — Telephone Encounter (Signed)
Caren Griffins from [employer] called and stated that patient is requesting an extension for her disability claim. Caren Griffins would like for someone to call her back with the following information at [phone]: patients restrictions to return to work, when can patient return to work and any med changes. She stated that the most recent office note can be faxed at [phone]. Please reference claim number [id number] on all correspondence.

## 2013-10-19 NOTE — Telephone Encounter (Signed)
Last Office Visit information has been faxed successfully to Caren Griffins from [employer].

## 2013-10-20 ENCOUNTER — Other Ambulatory Visit: Payer: 59

## 2013-11-04 ENCOUNTER — Encounter: Payer: Self-pay | Admitting: Family Medicine

## 2013-11-04 ENCOUNTER — Ambulatory Visit (INDEPENDENT_AMBULATORY_CARE_PROVIDER_SITE_OTHER): Payer: 59 | Admitting: Family Medicine

## 2013-11-04 VITALS — BP 120/78 | HR 81 | Temp 98.2°F | Resp 16 | Wt 120.5 lb

## 2013-11-04 DIAGNOSIS — E785 Hyperlipidemia, unspecified: Secondary | ICD-10-CM

## 2013-11-04 DIAGNOSIS — F3289 Other specified depressive episodes: Secondary | ICD-10-CM

## 2013-11-04 DIAGNOSIS — F329 Major depressive disorder, single episode, unspecified: Secondary | ICD-10-CM

## 2013-11-04 MED ORDER — SIMVASTATIN 20 MG PO TABS: 20.0000 mg | ORAL_TABLET | Freq: Every day | ORAL | Status: DC

## 2013-11-04 NOTE — Assessment & Plan Note (Signed)
Improved since increasing lexapro to 20mg .  Pt still in counseling.  Ready to return to work next week- letter given.  Will continue to follow closely.

## 2013-11-04 NOTE — Assessment & Plan Note (Signed)
Pt is intolerant to Crestor- myalgias.  Husband had similar results w/ Crestor but is doing well w/ Zocor.  Pt would like to switch.  Repeat LFTs at next appt.

## 2013-11-04 NOTE — Patient Instructions (Signed)
Follow up in 6-8 weeks to recheck mood after going back to work Continue the Coca ColaLexapro Continue the counseling- I think it's working! Call with any questions or concerns GOOD LUCK AT WORK!!!

## 2013-11-04 NOTE — Progress Notes (Signed)
   Subjective:    Patient ID: Kaylee Pratt, female    DOB: 1956/01/12, 58 y.o.   MRN: [id number]  HPI Depression- pt feels she is doing better than previous.  Still in therapy.  Pt feels sxs have improved since increasing Lexapro to 20mg .  Now expressing her inner voice and standing up for herself.  Is able to see the bright side of situations.  Pt's son is doing better- still recovering.  Sleeping better.  Eating normally- has gained 2 lbs.  Hyperlipidemia- had 'horrible muscle aches' on Crestor.  Husband is on Zocor and doing well.  Wants to switch.   Review of Systems For ROS see HPI     Objective:   Physical Exam  Vitals reviewed. Constitutional: She is oriented to person, place, and time. She appears well-developed and well-nourished. No distress.  HENT:  Head: Normocephalic and atraumatic.  Neurological: She is alert and oriented to person, place, and time.  Skin: Skin is warm and dry.  Psychiatric: She has a normal mood and affect. Her behavior is normal. Thought content normal.          Assessment & Plan:

## 2013-11-04 NOTE — Progress Notes (Signed)
Pre visit review using our clinic review tool, if applicable. No additional management support is needed unless otherwise documented below in the visit note. 

## 2013-11-09 ENCOUNTER — Telehealth: Payer: Self-pay | Admitting: *Deleted

## 2013-11-12 ENCOUNTER — Other Ambulatory Visit: Payer: Self-pay | Admitting: Internal Medicine

## 2013-11-12 NOTE — Telephone Encounter (Signed)
Error

## 2013-12-30 ENCOUNTER — Ambulatory Visit: Payer: 59 | Admitting: Family Medicine

## 2014-01-04 ENCOUNTER — Ambulatory Visit (INDEPENDENT_AMBULATORY_CARE_PROVIDER_SITE_OTHER): Payer: 59 | Admitting: Family Medicine

## 2014-01-04 ENCOUNTER — Encounter: Payer: Self-pay | Admitting: Family Medicine

## 2014-01-04 VITALS — BP 122/78 | HR 71 | Temp 98.2°F | Resp 16 | Wt 122.5 lb

## 2014-01-04 DIAGNOSIS — F3289 Other specified depressive episodes: Secondary | ICD-10-CM

## 2014-01-04 DIAGNOSIS — E785 Hyperlipidemia, unspecified: Secondary | ICD-10-CM

## 2014-01-04 DIAGNOSIS — F329 Major depressive disorder, single episode, unspecified: Secondary | ICD-10-CM

## 2014-01-04 LAB — HEPATIC FUNCTION PANEL
ALT: 17 U/L (ref 0–35)
AST: 19 U/L (ref 0–37)
Albumin: 4.2 g/dL (ref 3.5–5.2)
Alkaline Phosphatase: 41 U/L (ref 39–117)
Bilirubin, Direct: 0 mg/dL (ref 0.0–0.3)
Total Bilirubin: 0.5 mg/dL (ref 0.3–1.2)
Total Protein: 7.5 g/dL (ref 6.0–8.3)

## 2014-01-04 LAB — BASIC METABOLIC PANEL
BUN: 13 mg/dL (ref 6–23)
CO2: 28 mEq/L (ref 19–32)
Calcium: 9.1 mg/dL (ref 8.4–10.5)
Chloride: 103 mEq/L (ref 96–112)
Creatinine, Ser: 0.6 mg/dL (ref 0.4–1.2)
GFR: 105.35 mL/min (ref >60.00)
Glucose, Bld: 77 mg/dL (ref 70–99)
Potassium: 4.1 mEq/L (ref 3.5–5.1)
Sodium: 139 mEq/L (ref 135–145)

## 2014-01-04 LAB — LIPID PANEL
Cholesterol: 161 mg/dL (ref 0–200)
HDL: 50.7 mg/dL (ref >39.00)
LDL Cholesterol: 75 mg/dL (ref 0–99)
Total CHOL/HDL Ratio: 3
Triglycerides: 177 mg/dL — ABNORMAL HIGH (ref 0.0–149.0)
VLDL: 35.4 mg/dL (ref 0.0–40.0)

## 2014-01-04 NOTE — Progress Notes (Signed)
   Subjective:    Patient ID: Kaylee Pratt, female    DOB: 11-Mar-1956, 58 y.o.   MRN: [id number]  HPI Depression- chronic problem, on Lexapro 20mg  daily.  Stopped counseling, 'i'm tired of complaining'.  Pt reports that she has changed her attitude, 'now I tell people I'm good'.  Has a 2 year plan, 'I feel better with a plan'.  Still not sleeping well at night.  Hyperlipidemia- chronic problem, on Simvastatin daily.  Denies abd pain, N/V, myalgias.  Interested in repeating labs to see if there's improvement.   Review of Systems For ROS see HPI     Objective:   Physical Exam  Vitals reviewed. Constitutional: She is oriented to person, place, and time. She appears well-developed and well-nourished. No distress.  HENT:  Head: Normocephalic and atraumatic.  Eyes: Conjunctivae and EOM are normal. Pupils are equal, round, and reactive to light.  Neck: Normal range of motion. Neck supple. No thyromegaly present.  Cardiovascular: Normal rate, regular rhythm, normal heart sounds and intact distal pulses.   No murmur heard. Pulmonary/Chest: Effort normal and breath sounds normal. No respiratory distress.  Abdominal: Soft. She exhibits no distension. There is no tenderness.  Musculoskeletal: She exhibits no edema.  Lymphadenopathy:    She has no cervical adenopathy.  Neurological: She is alert and oriented to person, place, and time.  Skin: Skin is warm and dry.  Psychiatric: She has a normal mood and affect. Her behavior is normal.          Assessment & Plan:

## 2014-01-04 NOTE — Progress Notes (Signed)
Pre visit review using our clinic review tool, if applicable. No additional management support is needed unless otherwise documented below in the visit note. 

## 2014-01-04 NOTE — Assessment & Plan Note (Signed)
Improved as life returns to 'normal'.  Now has a 2 yr plan for work.  Son is improving.  Pt recently stopped counseling.  Continue Lexapro.  Will continue to monitor.

## 2014-01-04 NOTE — Assessment & Plan Note (Signed)
Pt is tolerating Simvastatin w/o difficulty.  Check labs.  Adjust meds prn

## 2014-01-04 NOTE — Patient Instructions (Signed)
Schedule your complete physical for later this summer (after July 21) We'll notify you of your lab results and make any changes if needed Call with any questions or concerns Keep up the good work!  You look great!!

## 2014-01-13 ENCOUNTER — Other Ambulatory Visit: Payer: Self-pay | Admitting: General Practice

## 2014-01-13 MED ORDER — ESCITALOPRAM OXALATE 20 MG PO TABS: 20.0000 mg | ORAL_TABLET | Freq: Every day | ORAL | Status: DC

## 2014-01-13 MED ORDER — SIMVASTATIN 20 MG PO TABS: 20.0000 mg | ORAL_TABLET | Freq: Every day | ORAL | Status: DC

## 2014-02-02 ENCOUNTER — Other Ambulatory Visit: Payer: Self-pay | Admitting: Family Medicine

## 2014-02-03 ENCOUNTER — Other Ambulatory Visit: Payer: Self-pay | Admitting: General Practice

## 2014-02-03 MED ORDER — ALBUTEROL SULFATE HFA 108 (90 BASE) MCG/ACT IN AERS: INHALATION_SPRAY | RESPIRATORY_TRACT | Status: DC

## 2014-02-03 MED ORDER — CLONAZEPAM 1 MG PO TABS: ORAL_TABLET | ORAL | Status: DC

## 2014-02-03 NOTE — Telephone Encounter (Signed)
Med filled and faxed.  

## 2014-02-03 NOTE — Telephone Encounter (Signed)
Last OV 01/04/14 Clonazepam last filled 09-23-13 #180 with 0  Low risk

## 2014-02-03 NOTE — Telephone Encounter (Signed)
Rx sent to the pharmacy by e-script.//AB/CMA 

## 2014-02-03 NOTE — Telephone Encounter (Signed)
Med filled.  

## 2014-02-08 ENCOUNTER — Other Ambulatory Visit: Payer: Self-pay | Admitting: General Practice

## 2014-02-08 ENCOUNTER — Other Ambulatory Visit: Payer: Self-pay | Admitting: Family Medicine

## 2014-02-08 MED ORDER — ALBUTEROL SULFATE HFA 108 (90 BASE) MCG/ACT IN AERS: INHALATION_SPRAY | RESPIRATORY_TRACT | Status: DC

## 2014-02-08 NOTE — Telephone Encounter (Signed)
Med filled.  

## 2014-03-31 ENCOUNTER — Encounter: Payer: 59 | Admitting: Family Medicine

## 2014-04-02 ENCOUNTER — Ambulatory Visit (INDEPENDENT_AMBULATORY_CARE_PROVIDER_SITE_OTHER): Payer: 59 | Admitting: Family Medicine

## 2014-04-02 ENCOUNTER — Encounter: Payer: Self-pay | Admitting: Family Medicine

## 2014-04-02 VITALS — BP 120/80 | HR 66 | Temp 98.1°F | Resp 16 | Ht 60.0 in | Wt 121.4 lb

## 2014-04-02 DIAGNOSIS — Z0000 Encounter for general adult medical examination without abnormal findings: Secondary | ICD-10-CM

## 2014-04-02 LAB — BASIC METABOLIC PANEL
BUN: 12 mg/dL (ref 6–23)
CO2: 30 mEq/L (ref 19–32)
Calcium: 9.1 mg/dL (ref 8.4–10.5)
Chloride: 105 mEq/L (ref 96–112)
Creatinine, Ser: 0.6 mg/dL (ref 0.4–1.2)
GFR: 113.68 mL/min (ref >60.00)
Glucose, Bld: 96 mg/dL (ref 70–99)
Potassium: 4.4 mEq/L (ref 3.5–5.1)
Sodium: 139 mEq/L (ref 135–145)

## 2014-04-02 LAB — HEPATIC FUNCTION PANEL
ALT: 27 U/L (ref 0–35)
AST: 25 U/L (ref 0–37)
Albumin: 4.1 g/dL (ref 3.5–5.2)
Alkaline Phosphatase: 48 U/L (ref 39–117)
Bilirubin, Direct: 0 mg/dL (ref 0.0–0.3)
Total Bilirubin: 0.7 mg/dL (ref 0.2–1.2)
Total Protein: 7.6 g/dL (ref 6.0–8.3)

## 2014-04-02 LAB — LIPID PANEL
Cholesterol: 218 mg/dL — ABNORMAL HIGH (ref 0–200)
HDL: 50.4 mg/dL (ref >39.00)
LDL Cholesterol: 129 mg/dL — ABNORMAL HIGH (ref 0–99)
NonHDL: 167.6
Total CHOL/HDL Ratio: 4
Triglycerides: 193 mg/dL — ABNORMAL HIGH (ref 0.0–149.0)
VLDL: 38.6 mg/dL (ref 0.0–40.0)

## 2014-04-02 LAB — CBC WITH DIFFERENTIAL/PLATELET
Basophils Absolute: 0 10*3/uL (ref 0.0–0.1)
Basophils Relative: 0.5 % (ref 0.0–3.0)
Eosinophils Absolute: 0.3 10*3/uL (ref 0.0–0.7)
Eosinophils Relative: 5.8 % — ABNORMAL HIGH (ref 0.0–5.0)
HCT: 40 % (ref 36.0–46.0)
Hemoglobin: 13.1 g/dL (ref 12.0–15.0)
Lymphocytes Relative: 30.8 % (ref 12.0–46.0)
Lymphs Abs: 1.5 10*3/uL (ref 0.7–4.0)
MCHC: 32.9 g/dL (ref 30.0–36.0)
MCV: 90.1 fl (ref 78.0–100.0)
Monocytes Absolute: 0.4 10*3/uL (ref 0.1–1.0)
Monocytes Relative: 8.9 % (ref 3.0–12.0)
Neutro Abs: 2.6 10*3/uL (ref 1.4–7.7)
Neutrophils Relative %: 54 % (ref 43.0–77.0)
Platelets: 272 10*3/uL (ref 150.0–400.0)
RBC: 4.43 Mil/uL (ref 3.87–5.11)
RDW: 13 % (ref 11.5–15.5)
WBC: 4.7 10*3/uL (ref 4.0–10.5)

## 2014-04-02 LAB — TSH: TSH: 0.95 u[IU]/mL (ref 0.35–4.50)

## 2014-04-02 LAB — VITAMIN D 25 HYDROXY (VIT D DEFICIENCY, FRACTURES): VITD: 83.14 ng/mL (ref 30.00–100.00)

## 2014-04-02 MED ORDER — CLONAZEPAM 1 MG PO TABS: ORAL_TABLET | ORAL | Status: DC

## 2014-04-02 MED ORDER — SIMVASTATIN 20 MG PO TABS: 20.0000 mg | ORAL_TABLET | Freq: Every day | ORAL | Status: DC

## 2014-04-02 NOTE — Patient Instructions (Signed)
Follow up in 6 months to recheck cholesterol We'll notify you of your lab results and make any changes if needed We'll call you with your mammo appt for October Keep up the good work!  You look great! Good luck in you new job!

## 2014-04-02 NOTE — Progress Notes (Signed)
Pre visit review using our clinic review tool, if applicable. No additional management support is needed unless otherwise documented below in the visit note. 

## 2014-04-02 NOTE — Progress Notes (Signed)
   Subjective:    Patient ID: Kaylee Pratt P Friddle, female    DOB: 05/07/1956, 58 y.o.   MRN: [id number]  HPI CPE- UTD on colonoscopy, UTD on mammo, due for pap next year.   Review of Systems Patient reports no vision/ hearing changes, adenopathy,fever, weight change,  persistant/recurrent hoarseness , swallowing issues, chest pain, palpitations, edema, persistant/recurrent cough, hemoptysis, dyspnea (rest/exertional/paroxysmal nocturnal), gastrointestinal bleeding (melena, rectal bleeding), abdominal pain, significant heartburn, bowel changes, GU symptoms (dysuria, hematuria, incontinence), Gyn symptoms (abnormal  bleeding, pain),  syncope, focal weakness, memory loss, numbness & tingling, skin/hair/nail changes, abnormal bruising or bleeding, anxiety, or depression.     Objective:   Physical Exam . General Appearance:    Alert, cooperative, no distress, appears stated age  Head:    Normocephalic, without obvious abnormality, atraumatic  Eyes:    PERRL, conjunctiva/corneas clear, EOM's intact, fundi    benign, both eyes  Ears:    Normal TM's and external ear canals, both ears  Nose:   Nares normal, septum midline, mucosa normal, no drainage    or sinus tenderness  Throat:   Lips, mucosa, and tongue normal; teeth and gums normal  Neck:   Supple, symmetrical, trachea midline, no adenopathy;    Thyroid: no enlargement/tenderness/nodules  Back:     Symmetric, no curvature, ROM normal, no CVA tenderness  Lungs:     Clear to auscultation bilaterally, respirations unlabored  Chest Wall:    No tenderness or deformity   Heart:    Regular rate and rhythm, S1 and S2 normal, no murmur, rub   or gallop  Breast Exam:    Deferred to mammo  Abdomen:     Soft, non-tender, bowel sounds active all four quadrants,    no masses, no organomegaly  Genitalia:    Deferred  Rectal:    Extremities:   Extremities normal, atraumatic, no cyanosis or edema  Pulses:   2+ and symmetric all extremities  Skin:   Skin color,  texture, turgor normal, no rashes or lesions  Lymph nodes:   Cervical, supraclavicular, and axillary nodes normal  Neurologic:   CNII-XII intact, normal strength, sensation and reflexes    throughout          Assessment & Plan:

## 2014-04-02 NOTE — Assessment & Plan Note (Signed)
Pt's PE WNL.  Due for mammo in Oct- order entered.  Check labs.  Anticipatory guidance provided.

## 2014-07-12 LAB — HM MAMMOGRAPHY

## 2014-08-03 ENCOUNTER — Encounter: Payer: Self-pay | Admitting: General Practice

## 2014-08-10 ENCOUNTER — Encounter: Payer: Self-pay | Admitting: Family Medicine

## 2014-08-23 ENCOUNTER — Encounter: Payer: Self-pay | Admitting: Family Medicine

## 2015-03-10 ENCOUNTER — Encounter: Payer: Self-pay | Admitting: Gastroenterology

## 2015-03-21 ENCOUNTER — Encounter: Payer: Self-pay | Admitting: Family Medicine

## 2015-03-21 ENCOUNTER — Other Ambulatory Visit: Payer: Self-pay | Admitting: Family Medicine

## 2015-03-21 MED ORDER — CLONAZEPAM 1 MG PO TABS: ORAL_TABLET | ORAL | Status: DC

## 2015-03-21 NOTE — Telephone Encounter (Signed)
Last OV 04/02/14, CPE scheduled for November 2016.  Clonazepam last filled 04/02/14 #180 with 0

## 2015-03-21 NOTE — Telephone Encounter (Signed)
Med filled and faxed.  

## 2015-03-22 MED ORDER — SIMVASTATIN 20 MG PO TABS: 20.0000 mg | ORAL_TABLET | Freq: Every day | ORAL | Status: DC

## 2015-03-22 MED ORDER — ESCITALOPRAM OXALATE 20 MG PO TABS: 20.0000 mg | ORAL_TABLET | Freq: Every day | ORAL | Status: DC

## 2015-03-22 NOTE — Telephone Encounter (Signed)
meds filled

## 2015-05-04 ENCOUNTER — Encounter: Payer: Self-pay | Admitting: Family Medicine

## 2015-05-04 ENCOUNTER — Ambulatory Visit (INDEPENDENT_AMBULATORY_CARE_PROVIDER_SITE_OTHER): Payer: BLUE CROSS/BLUE SHIELD | Admitting: Family Medicine

## 2015-05-04 VITALS — BP 122/82 | HR 66 | Temp 98.0°F | Resp 16 | Ht 60.0 in | Wt 123.5 lb

## 2015-05-04 DIAGNOSIS — E785 Hyperlipidemia, unspecified: Secondary | ICD-10-CM | POA: Diagnosis not present

## 2015-05-04 LAB — BASIC METABOLIC PANEL
BUN: 19 mg/dL (ref 6–23)
CO2: 30 mEq/L (ref 19–32)
Calcium: 9.7 mg/dL (ref 8.4–10.5)
Chloride: 102 mEq/L (ref 96–112)
Creatinine, Ser: 0.71 mg/dL (ref 0.40–1.20)
GFR: 89.68 mL/min (ref >60.00)
Glucose, Bld: 90 mg/dL (ref 70–99)
Potassium: 4.8 mEq/L (ref 3.5–5.1)
Sodium: 139 mEq/L (ref 135–145)

## 2015-05-04 LAB — HEPATIC FUNCTION PANEL
ALT: 18 U/L (ref 0–35)
AST: 20 U/L (ref 0–37)
Albumin: 4.4 g/dL (ref 3.5–5.2)
Alkaline Phosphatase: 49 U/L (ref 39–117)
Bilirubin, Direct: 0.1 mg/dL (ref 0.0–0.3)
Total Bilirubin: 0.5 mg/dL (ref 0.2–1.2)
Total Protein: 7.8 g/dL (ref 6.0–8.3)

## 2015-05-04 LAB — LIPID PANEL
Cholesterol: 178 mg/dL (ref 0–200)
HDL: 55 mg/dL (ref >39.00)
LDL Cholesterol: 95 mg/dL (ref 0–99)
NonHDL: 123
Total CHOL/HDL Ratio: 3
Triglycerides: 140 mg/dL (ref 0.0–149.0)
VLDL: 28 mg/dL (ref 0.0–40.0)

## 2015-05-04 NOTE — Progress Notes (Signed)
   Subjective:    Patient ID: GEANA WALTS, female    DOB: July 13, 1956, 59 y.o.   MRN: [id number]  HPI Hyperlipidemia- chronic problem, on Simvastatin and Red Yeast Rice.  Walking regularly.  Denies CP, SOB, HAs, visual changes, edema, abd pain, N/V, myalgias.   Review of Systems For ROS see HPI     Objective:   Physical Exam  Constitutional: She is oriented to person, place, and time. She appears well-developed and well-nourished. No distress.  HENT:  Head: Normocephalic and atraumatic.  Eyes: Conjunctivae and EOM are normal. Pupils are equal, round, and reactive to light.  Neck: Normal range of motion. Neck supple. No thyromegaly present.  Cardiovascular: Normal rate, regular rhythm, normal heart sounds and intact distal pulses.   No murmur heard. Pulmonary/Chest: Effort normal and breath sounds normal. No respiratory distress.  Abdominal: Soft. She exhibits no distension. There is no tenderness.  Musculoskeletal: She exhibits no edema.  Lymphadenopathy:    She has no cervical adenopathy.  Neurological: She is alert and oriented to person, place, and time.  Skin: Skin is warm and dry.  Psychiatric: She has a normal mood and affect. Her behavior is normal.  Vitals reviewed.         Assessment & Plan:

## 2015-05-04 NOTE — Assessment & Plan Note (Signed)
Chronic problem.  Tolerating simvastatin and red yeast rice w/o difficulty.  Attempting to get regular exercise and make healthy food choices.  Applauded her efforts.  Check labs.  Adjust meds prn

## 2015-05-04 NOTE — Patient Instructions (Signed)
Schedule your complete physical at your convenience (put your name on our cancellation list) We'll notify you of your lab results and make any changes if needed Keep up the good work on healthy diet and regular exercise- you look great! Call with any questions or concerns Happy Labor Day!!!

## 2015-05-04 NOTE — Progress Notes (Signed)
Pre visit review using our clinic review tool, if applicable. No additional management support is needed unless otherwise documented below in the visit note. 

## 2015-05-06 ENCOUNTER — Encounter: Payer: Self-pay | Admitting: Family Medicine

## 2015-05-06 ENCOUNTER — Ambulatory Visit (INDEPENDENT_AMBULATORY_CARE_PROVIDER_SITE_OTHER): Payer: BLUE CROSS/BLUE SHIELD | Admitting: Family Medicine

## 2015-05-06 ENCOUNTER — Other Ambulatory Visit (HOSPITAL_COMMUNITY)
Admission: RE | Admit: 2015-05-06 | Discharge: 2015-05-06 | Disposition: A | Discharge status: Home / Self Care | Payer: BLUE CROSS/BLUE SHIELD | Source: Ambulatory Visit | Attending: Family Medicine | Admitting: Family Medicine

## 2015-05-06 VITALS — BP 120/80 | HR 72 | Temp 98.0°F | Resp 16 | Ht 60.0 in | Wt 123.3 lb

## 2015-05-06 DIAGNOSIS — Z124 Encounter for screening for malignant neoplasm of cervix: Secondary | ICD-10-CM | POA: Diagnosis not present

## 2015-05-06 DIAGNOSIS — Z01411 Encounter for gynecological examination (general) (routine) with abnormal findings: Secondary | ICD-10-CM | POA: Insufficient documentation

## 2015-05-06 DIAGNOSIS — Z780 Asymptomatic menopausal state: Secondary | ICD-10-CM

## 2015-05-06 DIAGNOSIS — Z0000 Encounter for general adult medical examination without abnormal findings: Secondary | ICD-10-CM

## 2015-05-06 DIAGNOSIS — Z1151 Encounter for screening for human papillomavirus (HPV): Secondary | ICD-10-CM | POA: Diagnosis present

## 2015-05-06 LAB — CBC WITH DIFFERENTIAL/PLATELET
Basophils Absolute: 0 10*3/uL (ref 0.0–0.1)
Basophils Relative: 1 % (ref 0–1)
Eosinophils Absolute: 0.2 10*3/uL (ref 0.0–0.7)
Eosinophils Relative: 5 % (ref 0–5)
HCT: 40 % (ref 36.0–46.0)
Hemoglobin: 13.2 g/dL (ref 12.0–15.0)
Lymphocytes Relative: 46 % (ref 12–46)
Lymphs Abs: 2.1 10*3/uL (ref 0.7–4.0)
MCH: 29.1 pg (ref 26.0–34.0)
MCHC: 33 g/dL (ref 30.0–36.0)
MCV: 88.3 fL (ref 78.0–100.0)
MPV: 10 fL (ref 8.6–12.4)
Monocytes Absolute: 0.4 10*3/uL (ref 0.1–1.0)
Monocytes Relative: 8 % (ref 3–12)
Neutro Abs: 1.8 10*3/uL (ref 1.7–7.7)
Neutrophils Relative %: 40 % — ABNORMAL LOW (ref 43–77)
Platelets: 278 10*3/uL (ref 150–400)
RBC: 4.53 MIL/uL (ref 3.87–5.11)
RDW: 13.2 % (ref 11.5–15.5)
WBC: 4.5 10*3/uL (ref 4.0–10.5)

## 2015-05-06 LAB — TSH: TSH: 0.67 u[IU]/mL (ref 0.350–4.500)

## 2015-05-06 MED ORDER — SIMVASTATIN 20 MG PO TABS: 20.0000 mg | ORAL_TABLET | Freq: Every day | ORAL | Status: DC

## 2015-05-06 NOTE — Progress Notes (Signed)
Pre visit review using our clinic review tool, if applicable. No additional management support is needed unless otherwise documented below in the visit note. 

## 2015-05-06 NOTE — Progress Notes (Signed)
   Subjective:    Patient ID: Kaylee Pratt, female    DOB: [birth year], 59 y.o.   MRN: [id number]  HPI CPE- UTD on colonoscopy.  Due for pap, DEXA.  UTD on mammo ([facility]).     Review of Systems Patient reports no vision/ hearing changes, adenopathy,fever, weight change,  persistant/recurrent hoarseness , swallowing issues, chest pain, palpitations, edema, persistant/recurrent cough, hemoptysis, dyspnea (rest/exertional/paroxysmal nocturnal), gastrointestinal bleeding (melena, rectal bleeding), abdominal pain, significant heartburn, bowel changes, GU symptoms (dysuria, hematuria, incontinence), Gyn symptoms (abnormal  bleeding, pain),  syncope, focal weakness, memory loss, numbness & tingling, skin/hair/nail changes, abnormal bruising or bleeding, anxiety, or depression.     Objective:   Physical Exam  General Appearance:    Alert, cooperative, no distress, appears stated age  Head:    Normocephalic, without obvious abnormality, atraumatic  Eyes:    PERRL, conjunctiva/corneas clear, EOM's intact, fundi    benign, both eyes  Ears:    Normal TM's and external ear canals, both ears  Nose:   Nares normal, septum midline, mucosa normal, no drainage    or sinus tenderness  Throat:   Lips, mucosa, and tongue normal; teeth and gums normal  Neck:   Supple, symmetrical, trachea midline, no adenopathy;    Thyroid: no enlargement/tenderness/nodules  Back:     Symmetric, no curvature, ROM normal, no CVA tenderness  Lungs:     Clear to auscultation bilaterally, respirations unlabored  Chest Wall:    No tenderness or deformity   Heart:    Regular rate and rhythm, S1 and S2 normal, no murmur, rub   or gallop  Breast Exam:    No tenderness, masses, or nipple abnormality  Abdomen:     Soft, non-tender, bowel sounds active all four quadrants,    no masses, no organomegaly  Genitalia:    External genitalia normal, cervix normal in appearance, no CMT, uterus in normal size and position, adnexa w/out mass or  tenderness, mucosa pink and moist, no lesions or discharge present  Rectal:    Normal external appearance  Extremities:   Extremities normal, atraumatic, no cyanosis or edema  Pulses:   2+ and symmetric all extremities  Skin:   Skin color, texture, turgor normal, no rashes or lesions  Lymph nodes:   Cervical, supraclavicular, and axillary nodes normal  Neurologic:   CNII-XII intact, normal strength, sensation and reflexes    throughout          Assessment & Plan:

## 2015-05-06 NOTE — Patient Instructions (Signed)
Follow up in 6 months to recheck cholesterol We'll notify you of your pap and lab results and make any changes if needed Keep up the good work on healthy diet and regular exercise You are due for your bone density and mammogram in November Call with any questions or concerns Happy Labor Day!!!

## 2015-05-07 LAB — VITAMIN D 25 HYDROXY (VIT D DEFICIENCY, FRACTURES): Vit D, 25-Hydroxy: 57 ng/mL (ref 30–100)

## 2015-05-07 NOTE — Assessment & Plan Note (Signed)
Pap collected. 

## 2015-05-07 NOTE — Assessment & Plan Note (Signed)
Pt's PE WNL.  UTD on mammo, colonoscopy.  Due for DEXA- order entered.  Check labs that were not recently done.  Anticipatory guidance provided.

## 2015-05-12 LAB — CYTOLOGY - PAP

## 2015-05-13 ENCOUNTER — Telehealth: Payer: Self-pay

## 2015-05-13 NOTE — Telephone Encounter (Signed)
-----   Message from Sheliah Hatch, MD sent at 05/13/2015  3:54 PM EDT ----- No high risk HPV present but not enough cells to interpret on the pap.  Will repeat in 1 year to attempt to get a better sample (this is not uncommon in menopausal women)

## 2015-05-13 NOTE — Telephone Encounter (Signed)
Pt notified no questions or concerns at this time.  

## 2015-06-10 ENCOUNTER — Encounter: Payer: Self-pay | Admitting: Family

## 2015-06-10 ENCOUNTER — Other Ambulatory Visit: Payer: Self-pay | Admitting: Family

## 2015-06-10 ENCOUNTER — Ambulatory Visit (INDEPENDENT_AMBULATORY_CARE_PROVIDER_SITE_OTHER)
Admission: RE | Admit: 2015-06-10 | Discharge: 2015-06-10 | Disposition: A | Discharge status: Home / Self Care | Payer: BLUE CROSS/BLUE SHIELD | Source: Ambulatory Visit | Attending: Family | Admitting: Family

## 2015-06-10 ENCOUNTER — Ambulatory Visit (INDEPENDENT_AMBULATORY_CARE_PROVIDER_SITE_OTHER): Payer: BLUE CROSS/BLUE SHIELD | Admitting: Family

## 2015-06-10 ENCOUNTER — Ambulatory Visit: Payer: BLUE CROSS/BLUE SHIELD | Admitting: Family Medicine

## 2015-06-10 VITALS — BP 110/78 | HR 77 | Temp 98.0°F | Resp 18 | Ht 60.0 in | Wt 124.0 lb

## 2015-06-10 DIAGNOSIS — R1031 Right lower quadrant pain: Secondary | ICD-10-CM

## 2015-06-10 DIAGNOSIS — K5732 Diverticulitis of large intestine without perforation or abscess without bleeding: Secondary | ICD-10-CM

## 2015-06-10 MED ORDER — IOHEXOL 300 MG/ML  SOLN
80.0000 mL | Freq: Once | INTRAMUSCULAR | Status: AC | PRN
  Administered 2015-06-10: 80 mL via INTRAVENOUS

## 2015-06-10 MED ORDER — CIPROFLOXACIN HCL 500 MG PO TABS: 500.0000 mg | ORAL_TABLET | Freq: Two times a day (BID) | ORAL | Status: DC

## 2015-06-10 MED ORDER — METRONIDAZOLE 500 MG PO TABS: 500.0000 mg | ORAL_TABLET | Freq: Three times a day (TID) | ORAL | Status: DC

## 2015-06-10 NOTE — Patient Instructions (Signed)
Thank you for choosing [facility].  Summary/Instructions:  Please stop by the lab on the basement level of the building for your blood work. Your results will be released to MyChart (or called to you) after review, usually within 72 hours after test completion. If any changes need to be made, you will be notified at that same time.  If your symptoms worsen or fail to improve, please contact our office for further instruction, or in case of emergency go directly to the emergency room at the closest medical facility.   Abdominal Pain Many things can cause abdominal pain. Usually, abdominal pain is not caused by a disease and will improve without treatment. It can often be observed and treated at home. Your health care provider will do a physical exam and possibly order blood tests and X-rays to help determine the seriousness of your pain. However, in many cases, more time must pass before a clear cause of the pain can be found. Before that point, your health care provider may not know if you need more testing or further treatment. HOME CARE INSTRUCTIONS  Monitor your abdominal pain for any changes. The following actions may help to alleviate any discomfort you are experiencing:  Only take over-the-counter or prescription medicines as directed by your health care provider.  Do not take laxatives unless directed to do so by your health care provider.  Try a clear liquid diet (broth, tea, or water) as directed by your health care provider. Slowly move to a bland diet as tolerated. SEEK MEDICAL CARE IF:  You have unexplained abdominal pain.  You have abdominal pain associated with nausea or diarrhea.  You have pain when you urinate or have a bowel movement.  You experience abdominal pain that wakes you in the night.  You have abdominal pain that is worsened or improved by eating food.  You have abdominal pain that is worsened with eating fatty foods.  You have a fever. SEEK  IMMEDIATE MEDICAL CARE IF:   Your pain does not go away within 2 hours.  You keep throwing up (vomiting).  Your pain is felt only in portions of the abdomen, such as the right side or the left lower portion of the abdomen.  You pass bloody or black tarry stools. MAKE SURE YOU:  Understand these instructions.   Will watch your condition.   Will get help right away if you are not doing well or get worse.  Document Released: 06/06/2005 Document Revised: 09/01/2013 Document Reviewed: 05/06/2013 [facility] Patient Information 2015 [place], [place]. This information is not intended to replace advice given to you by your health care provider. Make sure you discuss any questions you have with your health care provider.

## 2015-06-10 NOTE — Progress Notes (Signed)
Subjective:    Patient ID: Kaylee Pratt, female    DOB: 10/06/1955, 59 y.o.   MRN: [id number]  Chief Complaint  Patient presents with  . Abdominal Pain    having stomach pain in right lower quadrant, started on tuesday, feels bloated and eats healthy, constant pain, fatigue and nausea     HPI:  Kaylee Pratt is a 59 y.o. female who  has a past medical history of Osteoporosis; Asthma; Depression; GERD (gastroesophageal reflux disease); Allergy; Duodenal diverticulum; Hiatal hernia; and GERD (gastroesophageal reflux disease). and presents today for an acute office visit.   This is a new problem. Associated symptom of pain located in her right lower quadrant has been going on for about 3-4 days and describes feelings of bloatedness, constant pain, fatigue and nausea. Eats a healthy diet and denies constipation. The course of the pain has been getting worse progressively and her appetite has been decreased. Denies any modifying factors or treatments in attempt to make it better. Severity of the pain is 8/10.   No Known Allergies   Current Outpatient Prescriptions on File Prior to Visit  Medication Sig Dispense Refill  . albuterol (PROAIR HFA) 108 (90 BASE) MCG/ACT inhaler INHALE 2 PUFFS INTO THE LUNGS EVERY 6 (SIX) HOURS AS NEEDED FOR WHEEZING. 18 each 4  . Ascorbic Acid (VITAMIN C) 1000 MG tablet Take 1,000 mg by mouth daily.    [name] Kitchen aspirin EC 81 MG tablet Take 162 mg by mouth daily.     [name] Kitchen AUVI-Q 0.3 MG/0.3ML SOAJ injection     . cholecalciferol (VITAMIN D) 1000 UNITS tablet Take 2,000 Units by mouth daily.    . clonazePAM (KLONOPIN) 1 MG tablet 1/2 - 1 tab po bid 180 tablet 0  . cyclobenzaprine (FLEXERIL) 5 MG tablet TAKE 1 TABLET BY MOUTH TWICE A DAY AS NEEDED FOR MUSCLE SPASMS 30 tablet 0  . escitalopram (LEXAPRO) 20 MG tablet Take 1 tablet (20 mg total) by mouth daily. 90 tablet 1  . fish oil-omega-3 fatty acids 1000 MG capsule Take 2 g by mouth daily.    [name] Kitchen loratadine-pseudoephedrine  (CLARITIN-D 24 HOUR) 10-240 MG per 24 hr tablet Take 1 tablet by mouth daily. 30 tablet 3  . Red Yeast Rice Extract (RED YEAST RICE PO) Take 1,200 capsules by mouth daily.    . simvastatin (ZOCOR) 20 MG tablet Take 1 tablet (20 mg total) by mouth at bedtime. 90 tablet 1   No current facility-administered medications on file prior to visit.    Past Medical History  Diagnosis Date  . Osteoporosis   . Asthma   . Depression   . GERD (gastroesophageal reflux disease)   . Allergy   . Duodenal diverticulum   . Hiatal hernia   . GERD (gastroesophageal reflux disease)     Review of Systems  Constitutional: Positive for chills. Negative for fever.  Gastrointestinal: Positive for nausea, abdominal pain and abdominal distention. Negative for vomiting, diarrhea and constipation.      Objective:    BP 110/78 mmHg  Pulse 77  Temp(Src) 98 F (36.7 C) (Oral)  Resp 18  Ht 5' (1.524 m)  Wt 124 lb (56.246 kg)  BMI 24.22 kg/m2  SpO2 94% Nursing note and vital signs reviewed.  Physical Exam  Constitutional: She is oriented to person, place, and time. She appears well-developed and well-nourished. No distress.  Cardiovascular: Normal rate, regular rhythm, normal heart sounds and intact distal pulses.   Pulmonary/Chest: Effort normal and breath sounds  normal.  Abdominal: Soft. Normal appearance. She exhibits no mass. Bowel sounds are decreased. There is no hepatosplenomegaly. There is tenderness. There is rebound and tenderness at McBurney's point. There is no rigidity and no guarding.  Neurological: She is alert and oriented to person, place, and time.  Skin: Skin is warm and dry.  Psychiatric: She has a normal mood and affect. Her behavior is normal. Judgment and thought content normal.       Assessment & Plan:   Problem List Items Addressed This Visit      Other   Right lower quadrant pain - Primary    Right lower quadrant pain with positive McBurney's point and psoas sign. Concern  for appendicitis, however cannot rule out ovarian cyst. Obtain STAT CT to rule out appedicitis. Further treatment and evaluation pending CT results.       Relevant Orders   CT Abdomen Pelvis W Contrast

## 2015-06-10 NOTE — Progress Notes (Signed)
Pre visit review using our clinic review tool, if applicable. No additional management support is needed unless otherwise documented below in the visit note. 

## 2015-06-10 NOTE — Assessment & Plan Note (Signed)
Right lower quadrant pain with positive McBurney's point and psoas sign. Concern for appendicitis, however cannot rule out ovarian cyst. Obtain STAT CT to rule out appedicitis. Further treatment and evaluation pending CT results.

## 2015-06-15 ENCOUNTER — Encounter: Payer: Self-pay | Admitting: Family Medicine

## 2015-06-15 ENCOUNTER — Other Ambulatory Visit: Payer: Self-pay | Admitting: Family Medicine

## 2015-06-16 ENCOUNTER — Other Ambulatory Visit: Payer: Self-pay | Admitting: General Practice

## 2015-06-16 MED ORDER — ONDANSETRON HCL 4 MG PO TABS: 4.0000 mg | ORAL_TABLET | Freq: Three times a day (TID) | ORAL | Status: DC | PRN

## 2015-06-16 MED ORDER — CLONAZEPAM 1 MG PO TABS: ORAL_TABLET | ORAL | Status: DC

## 2015-06-16 NOTE — Telephone Encounter (Signed)
Medication filled to pharmacy as requested.   

## 2015-06-16 NOTE — Telephone Encounter (Signed)
Last OV 05/06/15 Clonazepam last filled 03/21/15 #180 with 0

## 2015-07-11 ENCOUNTER — Encounter: Payer: Self-pay | Admitting: Family Medicine

## 2015-07-11 ENCOUNTER — Other Ambulatory Visit: Payer: Self-pay | Admitting: Family Medicine

## 2015-07-11 ENCOUNTER — Other Ambulatory Visit: Payer: Self-pay | Admitting: General Practice

## 2015-07-11 ENCOUNTER — Telehealth: Payer: Self-pay | Admitting: General Practice

## 2015-07-11 ENCOUNTER — Ambulatory Visit (INDEPENDENT_AMBULATORY_CARE_PROVIDER_SITE_OTHER): Payer: BLUE CROSS/BLUE SHIELD | Admitting: Family Medicine

## 2015-07-11 ENCOUNTER — Ambulatory Visit (HOSPITAL_BASED_OUTPATIENT_CLINIC_OR_DEPARTMENT_OTHER)
Admission: RE | Admit: 2015-07-11 | Discharge: 2015-07-11 | Disposition: A | Discharge status: Home / Self Care | Payer: BLUE CROSS/BLUE SHIELD | Source: Ambulatory Visit | Attending: Family Medicine | Admitting: Family Medicine

## 2015-07-11 VITALS — BP 122/80 | HR 74 | Temp 98.2°F | Resp 16 | Wt 125.0 lb

## 2015-07-11 DIAGNOSIS — H9312 Tinnitus, left ear: Secondary | ICD-10-CM | POA: Diagnosis not present

## 2015-07-11 DIAGNOSIS — R296 Repeated falls: Secondary | ICD-10-CM

## 2015-07-11 DIAGNOSIS — J0100 Acute maxillary sinusitis, unspecified: Secondary | ICD-10-CM | POA: Insufficient documentation

## 2015-07-11 DIAGNOSIS — Z1231 Encounter for screening mammogram for malignant neoplasm of breast: Secondary | ICD-10-CM

## 2015-07-11 DIAGNOSIS — Z780 Asymptomatic menopausal state: Secondary | ICD-10-CM

## 2015-07-11 MED ORDER — AMOXICILLIN 875 MG PO TABS: 875.0000 mg | ORAL_TABLET | Freq: Two times a day (BID) | ORAL | Status: DC

## 2015-07-11 MED ORDER — SIMVASTATIN 20 MG PO TABS: 20.0000 mg | ORAL_TABLET | Freq: Every day | ORAL | Status: DC

## 2015-07-11 NOTE — Telephone Encounter (Signed)
Post-menopausal estrogen deficiency 

## 2015-07-11 NOTE — Patient Instructions (Signed)
Follow up as needed We'll notify you of your CT appt and results Start the Amoxicillin twice daily- take w/ food Drink plenty of fluids Have Dr Annalee GentaShoemaker send me a copy of his notes If you have another fall, please call me immediately so I can refer you to neurology Call with any questions or concerns If you want to join us at the new [place] office, any scheduled appointments will automatically transfer and we will see you at [address] [place], [place], [place] [id number] Hang in there!!!

## 2015-07-11 NOTE — Assessment & Plan Note (Signed)
New.  Pt's sxs and PE consistent w/ infxn.  Start abx.  Reviewed supportive care and red flags that should prompt return.  Pt expressed understanding and is in agreement w/ plan.  

## 2015-07-11 NOTE — Progress Notes (Signed)
   Subjective:    Patient ID: Kaylee Pratt, female    DOB: [birth year], 59 y.o.   MRN: [id number]  HPI Falls- pt w/ 3 falls in the last few weeks.  Pt reports she was exercising on a ball and fell backwards, hitting her head.  Denies LOC.  The other 2 falls resulted from pt turning quickly and 'my legs got tangled up'.  Pt reports she is having L ear ringing and 'pounding' in L ear.  Applied OTC ear drops to stop the ringing.  Sensitive to sound.  Pt continues to have headaches.  No blurry or double vision.   Review of Systems For ROS see HPI     Objective:   Physical Exam  Constitutional: She is oriented to person, place, and time. She appears well-developed and well-nourished. No distress.  HENT:  Head: Normocephalic and atraumatic.  Right Ear: Tympanic membrane normal.  Left Ear: Tympanic membrane normal.  Nose: Mucosal edema and rhinorrhea present. Right sinus exhibits maxillary sinus tenderness. Right sinus exhibits no frontal sinus tenderness. Left sinus exhibits maxillary sinus tenderness. Left sinus exhibits no frontal sinus tenderness.  Mouth/Throat: Uvula is midline and mucous membranes are normal. Posterior oropharyngeal erythema present. No oropharyngeal exudate.  Eyes: Conjunctivae and EOM are normal. Pupils are equal, round, and reactive to light.  Neck: Normal range of motion. Neck supple.  Cardiovascular: Normal rate, regular rhythm and normal heart sounds.   Pulmonary/Chest: Effort normal and breath sounds normal. No respiratory distress. She has no wheezes.  Lymphadenopathy:    She has no cervical adenopathy.  Neurological: She is alert and oriented to person, place, and time. She has normal reflexes. No cranial nerve deficit. Coordination normal.  Skin: Skin is warm and dry.  Psychiatric: She has a normal mood and affect. Her behavior is normal. Thought content normal.  Vitals reviewed.         Assessment & Plan:

## 2015-07-11 NOTE — Assessment & Plan Note (Signed)
New.  Pt has had 3 recent falls, all of which involve hitting her head.  Pt feels that her legs got tangled when trying to turn too quickly but she can't say for certain how she fell.  Due to the falls, her Giorgia, and ringing in her ears, will get head CT to assess.  If no improvement in Lequita or pt continues to fall, will refer to neuro for complete evaluation.  Pt expressed understanding and is in agreement w/ plan.

## 2015-07-11 NOTE — Progress Notes (Signed)
Pre visit review using our clinic review tool, if applicable. No additional management support is needed unless otherwise documented below in the visit note. 

## 2015-07-11 NOTE — Telephone Encounter (Signed)
Per pt she has a mammogram and bone density scheduled this week. Please advise on the dx for the dexa (post menopausal? )

## 2015-07-11 NOTE — Assessment & Plan Note (Signed)
New.  Pt has appt w/ ENT next week.  Unclear if this is due to her current maxillary sinus infection or if this is Meniere's (ringing and dizziness).  Encouraged increased fluid intake.  Pt to have ENT send copy of their note.  Will follow along.

## 2015-07-11 NOTE — Telephone Encounter (Signed)
Orders placed.

## 2015-07-14 ENCOUNTER — Encounter: Payer: Self-pay | Admitting: Family Medicine

## 2015-07-15 ENCOUNTER — Ambulatory Visit (HOSPITAL_BASED_OUTPATIENT_CLINIC_OR_DEPARTMENT_OTHER)
Admission: RE | Admit: 2015-07-15 | Discharge: 2015-07-15 | Disposition: A | Discharge status: Home / Self Care | Payer: BLUE CROSS/BLUE SHIELD | Source: Ambulatory Visit | Attending: Family Medicine | Admitting: Family Medicine

## 2015-07-15 DIAGNOSIS — M810 Age-related osteoporosis without current pathological fracture: Secondary | ICD-10-CM | POA: Insufficient documentation

## 2015-07-15 DIAGNOSIS — Z1231 Encounter for screening mammogram for malignant neoplasm of breast: Secondary | ICD-10-CM | POA: Diagnosis not present

## 2015-07-15 DIAGNOSIS — Z780 Asymptomatic menopausal state: Secondary | ICD-10-CM | POA: Diagnosis not present

## 2015-07-21 ENCOUNTER — Ambulatory Visit (INDEPENDENT_AMBULATORY_CARE_PROVIDER_SITE_OTHER): Payer: BLUE CROSS/BLUE SHIELD | Admitting: Family Medicine

## 2015-07-21 ENCOUNTER — Encounter: Payer: Self-pay | Admitting: Family Medicine

## 2015-07-21 VITALS — BP 130/78 | HR 76 | Temp 98.0°F | Resp 16 | Wt 126.0 lb

## 2015-07-21 DIAGNOSIS — T887XXA Unspecified adverse effect of drug or medicament, initial encounter: Secondary | ICD-10-CM | POA: Diagnosis not present

## 2015-07-21 DIAGNOSIS — T50905A Adverse effect of unspecified drugs, medicaments and biological substances, initial encounter: Secondary | ICD-10-CM

## 2015-07-21 MED ORDER — TRIAMCINOLONE ACETONIDE 0.1 % EX OINT: 1.0000 "application " | TOPICAL_OINTMENT | Freq: Two times a day (BID) | CUTANEOUS | Status: DC

## 2015-07-21 MED ORDER — METHYLPREDNISOLONE ACETATE 80 MG/ML IJ SUSP
80.0000 mg | Freq: Once | INTRAMUSCULAR | Status: AC
  Administered 2015-07-21: 80 mg via INTRAMUSCULAR

## 2015-07-21 NOTE — Progress Notes (Signed)
Pre visit review using our clinic review tool, if applicable. No additional management support is needed unless otherwise documented below in the visit note. 

## 2015-07-21 NOTE — Assessment & Plan Note (Signed)
New.  Pt's area of concern almost appears consistent w/ a chemical burn.  Pt denies use of new soaps, detergents, makeup or lotion.  Area broke out after starting Amoxicillin.  Due to the temporal relationship, will call this a drug rash.  Due to intense itching, pt got Depo-medrol injxn in office and will start topical Triamcinolone ointment twice daily.  Reviewed supportive care and red flags that should prompt return.  Pt expressed understanding and is in agreement w/ plan.

## 2015-07-21 NOTE — Progress Notes (Signed)
   Subjective:    Patient ID: Kaylee Pratt, female    DOB: 08/30/1956, 59 y.o.   MRN: [id number]  HPI Rash- pt reports that itchy, red rash on neck corresponded w/ started Amoxicillin.  Pt reports area is slowly spreading.  Pt is done w/ Amox.  Taking Benadryl and Zyrtec.  Very itchy.  Pt has applied neosporin w/ some relief.  No hx of similar when she has taken Amox in the past   Review of Systems For ROS see HPI     Objective:   Physical Exam  Constitutional: She is oriented to person, place, and time. She appears well-developed and well-nourished. No distress.  HENT:  Head: Normocephalic and atraumatic.  Neurological: She is alert and oriented to person, place, and time.  Skin: Skin is warm and dry. Rash: large area of erythema over R neck and extending over clavicle. There is erythema.  No evidence of excoriation No other areas of redness  Psychiatric: She has a normal mood and affect. Her behavior is normal.  Vitals reviewed.         Assessment & Plan:

## 2015-07-21 NOTE — Patient Instructions (Signed)
Follow up as needed TRY NOT TO SCRATCH!!! Apply the triamcinolone twice daily to help w/ itching and healing Do not put other creams/lotions on the area to avoid irritation Continue Benadryl at night and Zyrtec during the day Call with any questions or concerns Hang in there!!!

## 2015-11-07 ENCOUNTER — Ambulatory Visit (INDEPENDENT_AMBULATORY_CARE_PROVIDER_SITE_OTHER): Payer: BLUE CROSS/BLUE SHIELD | Admitting: Family Medicine

## 2015-11-07 ENCOUNTER — Encounter: Payer: Self-pay | Admitting: Family Medicine

## 2015-11-07 VITALS — BP 124/82 | HR 66 | Temp 98.1°F | Resp 16 | Ht 60.0 in | Wt 129.0 lb

## 2015-11-07 DIAGNOSIS — E785 Hyperlipidemia, unspecified: Secondary | ICD-10-CM | POA: Diagnosis not present

## 2015-11-07 LAB — BASIC METABOLIC PANEL
BUN: 15 mg/dL (ref 6–23)
CO2: 29 mEq/L (ref 19–32)
Calcium: 9.3 mg/dL (ref 8.4–10.5)
Chloride: 103 mEq/L (ref 96–112)
Creatinine, Ser: 0.63 mg/dL (ref 0.40–1.20)
GFR: 102.76 mL/min (ref >60.00)
Glucose, Bld: 94 mg/dL (ref 70–99)
Potassium: 4.2 mEq/L (ref 3.5–5.1)
Sodium: 140 mEq/L (ref 135–145)

## 2015-11-07 LAB — HEPATIC FUNCTION PANEL
ALT: 17 U/L (ref 0–35)
AST: 18 U/L (ref 0–37)
Albumin: 4.2 g/dL (ref 3.5–5.2)
Alkaline Phosphatase: 52 U/L (ref 39–117)
Bilirubin, Direct: 0.1 mg/dL (ref 0.0–0.3)
Total Bilirubin: 0.4 mg/dL (ref 0.2–1.2)
Total Protein: 7.6 g/dL (ref 6.0–8.3)

## 2015-11-07 LAB — LDL CHOLESTEROL, DIRECT: Direct LDL: 118 mg/dL

## 2015-11-07 LAB — LIPID PANEL
Cholesterol: 224 mg/dL — ABNORMAL HIGH (ref 0–200)
HDL: 53.8 mg/dL (ref >39.00)
NonHDL: 169.75
Total CHOL/HDL Ratio: 4
Triglycerides: 281 mg/dL — ABNORMAL HIGH (ref 0.0–149.0)
VLDL: 56.2 mg/dL — ABNORMAL HIGH (ref 0.0–40.0)

## 2015-11-07 MED ORDER — ALBUTEROL SULFATE HFA 108 (90 BASE) MCG/ACT IN AERS: INHALATION_SPRAY | RESPIRATORY_TRACT | Status: DC

## 2015-11-07 NOTE — Progress Notes (Signed)
Pre visit review using our clinic review tool, if applicable. No additional management support is needed unless otherwise documented below in the visit note. 

## 2015-11-07 NOTE — Progress Notes (Signed)
   Subjective:    Patient ID: Kaylee Pratt, female    DOB: 1956-04-14, 60 y.o.   MRN: [id number]  HPI Hyperlipidemia- ongoing issue for pt.  On Simvastatin every other day.  Some regular exercise.  No CP, SOB, HAs, visual changes, abd pain, N/V, myalgias.   Review of Systems For ROS see HPI     Objective:   Physical Exam  Constitutional: She is oriented to person, place, and time. She appears well-developed and well-nourished. No distress.  HENT:  Head: Normocephalic and atraumatic.  Eyes: Conjunctivae and EOM are normal. Pupils are equal, round, and reactive to light.  Neck: Normal range of motion. Neck supple. No thyromegaly present.  Cardiovascular: Normal rate, regular rhythm, normal heart sounds and intact distal pulses.   No murmur heard. Pulmonary/Chest: Effort normal and breath sounds normal. No respiratory distress.  Abdominal: Soft. She exhibits no distension. There is no tenderness.  Musculoskeletal: She exhibits no edema.  Lymphadenopathy:    She has no cervical adenopathy.  Neurological: She is alert and oriented to person, place, and time.  Skin: Skin is warm and dry.  Psychiatric: She has a normal mood and affect. Her behavior is normal.  Vitals reviewed.         Assessment & Plan:

## 2015-11-07 NOTE — Patient Instructions (Signed)
Schedule your complete physical in 6 months We'll notify you of your lab results and make any changes if needed Keep up the good work on healthy diet and regular exercise- you look great!!! Call with any questions or concerns If you want to join [place] at the new [place] office, any scheduled appointments will automatically transfer and we will see you at [address] [place] [address] [place], [place], [place] [address] [facility]) Have a great week!!!

## 2015-11-07 NOTE — Assessment & Plan Note (Signed)
Chronic problem, tolerating statin w/o difficulty as long as taking every other day.  Stressed need for healthy diet and regular exercise.  Check labs.  Adjust meds prn

## 2015-11-21 ENCOUNTER — Telehealth: Payer: Self-pay | Admitting: Family Medicine

## 2015-11-21 NOTE — Telephone Encounter (Signed)
Caller name: Self  Can be reached: [phone]   Reason for call: Patient wants to know if she needs to come in if she think she has a sinus infection. Plse adv

## 2015-11-22 NOTE — Telephone Encounter (Signed)
noted 

## 2015-11-22 NOTE — Telephone Encounter (Signed)
Called patient to inform her of below. Patient states that she will try something OTC because she is traveling this week and can not schedule an appt.

## 2015-11-22 NOTE — Telephone Encounter (Signed)
Yes, pt would need an appt.

## 2015-11-26 ENCOUNTER — Other Ambulatory Visit: Payer: Self-pay | Admitting: Family Medicine

## 2015-11-26 NOTE — Telephone Encounter (Signed)
Medication filled to pharmacy as requested.   

## 2016-01-10 ENCOUNTER — Other Ambulatory Visit: Payer: Self-pay | Admitting: General Practice

## 2016-01-10 MED ORDER — ESCITALOPRAM OXALATE 20 MG PO TABS: ORAL_TABLET | ORAL | Status: DC

## 2016-01-10 NOTE — Telephone Encounter (Signed)
Last OV 11/07/15 Clonazepam last filled 06/16/15 #180 with 0

## 2016-01-11 MED ORDER — CLONAZEPAM 1 MG PO TABS: ORAL_TABLET | ORAL | Status: DC

## 2016-01-11 NOTE — Telephone Encounter (Signed)
Medication filled to pharmacy as requested.   

## 2016-04-05 ENCOUNTER — Other Ambulatory Visit: Payer: Self-pay | Admitting: Family Medicine

## 2016-04-06 NOTE — Telephone Encounter (Signed)
Left message on machine for rx refill. Does patient have enough medication (clonazepam) to last until Dr. Beverely Low return on 04-11-16.

## 2016-04-06 NOTE — Telephone Encounter (Signed)
Spoke to patient, she has enough medication to wait until Dr. Beverely Low comes back to the office.

## 2016-04-08 ENCOUNTER — Other Ambulatory Visit: Payer: Self-pay | Admitting: Family Medicine

## 2016-04-11 NOTE — Telephone Encounter (Signed)
Last OV 11/07/15 Clonazepam last filled 01/11/16 #180 with 0

## 2016-05-08 ENCOUNTER — Encounter: Payer: Self-pay | Admitting: Family Medicine

## 2016-05-08 ENCOUNTER — Ambulatory Visit (INDEPENDENT_AMBULATORY_CARE_PROVIDER_SITE_OTHER): Payer: BLUE CROSS/BLUE SHIELD | Admitting: Family Medicine

## 2016-05-08 VITALS — BP 124/80 | HR 72 | Temp 98.0°F | Resp 16 | Ht 60.0 in | Wt 132.0 lb

## 2016-05-08 DIAGNOSIS — Z0000 Encounter for general adult medical examination without abnormal findings: Secondary | ICD-10-CM

## 2016-05-08 DIAGNOSIS — Z23 Encounter for immunization: Secondary | ICD-10-CM

## 2016-05-08 LAB — CBC WITH DIFFERENTIAL/PLATELET
Basophils Absolute: 0 10*3/uL (ref 0.0–0.1)
Basophils Relative: 0.8 % (ref 0.0–3.0)
Eosinophils Absolute: 0.3 10*3/uL (ref 0.0–0.7)
Eosinophils Relative: 6.8 % — ABNORMAL HIGH (ref 0.0–5.0)
HCT: 39.8 % (ref 36.0–46.0)
Hemoglobin: 13.2 g/dL (ref 12.0–15.0)
Lymphocytes Relative: 34.4 % (ref 12.0–46.0)
Lymphs Abs: 1.6 10*3/uL (ref 0.7–4.0)
MCHC: 33.1 g/dL (ref 30.0–36.0)
MCV: 88.1 fl (ref 78.0–100.0)
Monocytes Absolute: 0.3 10*3/uL (ref 0.1–1.0)
Monocytes Relative: 7.2 % (ref 3.0–12.0)
Neutro Abs: 2.4 10*3/uL (ref 1.4–7.7)
Neutrophils Relative %: 50.8 % (ref 43.0–77.0)
Platelets: 277 10*3/uL (ref 150.0–400.0)
RBC: 4.52 Mil/uL (ref 3.87–5.11)
RDW: 13.2 % (ref 11.5–15.5)
WBC: 4.7 10*3/uL (ref 4.0–10.5)

## 2016-05-08 LAB — HEPATIC FUNCTION PANEL
ALT: 30 U/L (ref 0–35)
AST: 26 U/L (ref 0–37)
Albumin: 4.1 g/dL (ref 3.5–5.2)
Alkaline Phosphatase: 59 U/L (ref 39–117)
Bilirubin, Direct: 0.1 mg/dL (ref 0.0–0.3)
Total Bilirubin: 0.5 mg/dL (ref 0.2–1.2)
Total Protein: 7.1 g/dL (ref 6.0–8.3)

## 2016-05-08 LAB — BASIC METABOLIC PANEL
BUN: 17 mg/dL (ref 6–23)
CO2: 29 mEq/L (ref 19–32)
Calcium: 8.8 mg/dL (ref 8.4–10.5)
Chloride: 107 mEq/L (ref 96–112)
Creatinine, Ser: 0.7 mg/dL (ref 0.40–1.20)
GFR: 90.84 mL/min (ref >60.00)
Glucose, Bld: 99 mg/dL (ref 70–99)
Potassium: 4.3 mEq/L (ref 3.5–5.1)
Sodium: 142 mEq/L (ref 135–145)

## 2016-05-08 LAB — TSH: TSH: 0.83 u[IU]/mL (ref 0.35–4.50)

## 2016-05-08 LAB — VITAMIN D 25 HYDROXY (VIT D DEFICIENCY, FRACTURES): VITD: 61.93 ng/mL (ref 30.00–100.00)

## 2016-05-08 LAB — LIPID PANEL
Cholesterol: 189 mg/dL (ref 0–200)
HDL: 51.5 mg/dL (ref >39.00)
LDL Cholesterol: 102 mg/dL — ABNORMAL HIGH (ref 0–99)
NonHDL: 137.84
Total CHOL/HDL Ratio: 4
Triglycerides: 179 mg/dL — ABNORMAL HIGH (ref 0.0–149.0)
VLDL: 35.8 mg/dL (ref 0.0–40.0)

## 2016-05-08 NOTE — Progress Notes (Signed)
   Subjective:    Patient ID: Kaylee Pratt, female    DOB: [birth year], 60 y.o.   MRN: [id number]  HPI CPE- UTD on mammo (due November), Colonoscopy (due 2024), DEXA (due 2018),pap (due 2019).  Pt to get flu shot today.   Review of Systems Patient reports no vision/ hearing changes, adenopathy,fever, weight change,  persistant/recurrent hoarseness , swallowing issues, chest pain, palpitations, edema, persistant/recurrent cough, hemoptysis, dyspnea (rest/exertional/paroxysmal nocturnal), gastrointestinal bleeding (melena, rectal bleeding), abdominal pain, significant heartburn, bowel changes, GU symptoms (dysuria, hematuria, incontinence), Gyn symptoms (abnormal  bleeding, pain),  syncope, focal weakness, memory loss, numbness & tingling, skin/hair/nail changes, abnormal bruising or bleeding, anxiety, or depression.     Objective:   Physical Exam General Appearance:    Alert, cooperative, no distress, appears stated age  Head:    Normocephalic, without obvious abnormality, atraumatic  Eyes:    PERRL, conjunctiva/corneas clear, EOM's intact, fundi    benign, both eyes  Ears:    Normal TM's and external ear canals, both ears  Nose:   Nares normal, septum midline, mucosa normal, no drainage    or sinus tenderness  Throat:   Lips, mucosa, and tongue normal; teeth and gums normal  Neck:   Supple, symmetrical, trachea midline, no adenopathy;    Thyroid: no enlargement/tenderness/nodules  Back:     Symmetric, no curvature, ROM normal, no CVA tenderness  Lungs:     Clear to auscultation bilaterally, respirations unlabored  Chest Wall:    No tenderness or deformity   Heart:    Regular rate and rhythm, S1 and S2 normal, no murmur, rub   or gallop  Breast Exam:    Deferred to mammo  Abdomen:     Soft, non-tender, bowel sounds active all four quadrants,    no masses, no organomegaly  Genitalia:    Deferred  Rectal:    Extremities:   Extremities normal, atraumatic, no cyanosis or edema  Pulses:   2+  and symmetric all extremities  Skin:   Skin color, texture, turgor normal, no rashes or lesions  Lymph nodes:   Cervical, supraclavicular, and axillary nodes normal  Neurologic:   CNII-XII intact, normal strength, sensation and reflexes    throughout          Assessment & Plan:

## 2016-05-08 NOTE — Assessment & Plan Note (Signed)
Pt's PE WNL.  UTD on pap, mammo, DEXA, colonoscopy.  Flu shot today.  Check labs.  Anticipatory guidance provided.

## 2016-05-08 NOTE — Progress Notes (Signed)
Pre visit review using our clinic review tool, if applicable. No additional management support is needed unless otherwise documented below in the visit note. 

## 2016-05-08 NOTE — Patient Instructions (Signed)
Follow up in 6 months to recheck cholesterol We'll notify you of your lab results and make any changes if needed Continue to work on healthy diet and regular exercise- you look great!!! You are due for your mammo in November Call with any questions or concerns Happy Labor Day!!!

## 2016-05-09 DIAGNOSIS — J4520 Mild intermittent asthma, uncomplicated: Secondary | ICD-10-CM | POA: Diagnosis not present

## 2016-05-09 DIAGNOSIS — J301 Allergic rhinitis due to pollen: Secondary | ICD-10-CM | POA: Diagnosis not present

## 2016-05-09 DIAGNOSIS — J3089 Other allergic rhinitis: Secondary | ICD-10-CM | POA: Diagnosis not present

## 2016-05-09 DIAGNOSIS — H1045 Other chronic allergic conjunctivitis: Secondary | ICD-10-CM | POA: Diagnosis not present

## 2016-05-14 ENCOUNTER — Other Ambulatory Visit: Payer: Self-pay | Admitting: Family Medicine

## 2016-05-29 ENCOUNTER — Ambulatory Visit: Payer: BLUE CROSS/BLUE SHIELD

## 2016-06-11 ENCOUNTER — Ambulatory Visit (INDEPENDENT_AMBULATORY_CARE_PROVIDER_SITE_OTHER): Payer: BLUE CROSS/BLUE SHIELD | Admitting: Family Medicine

## 2016-06-11 ENCOUNTER — Encounter: Payer: Self-pay | Admitting: Family Medicine

## 2016-06-11 VITALS — BP 108/78 | HR 64 | Temp 97.8°F | Resp 16 | Ht 60.0 in | Wt 132.1 lb

## 2016-06-11 DIAGNOSIS — S40862A Insect bite (nonvenomous) of left upper arm, initial encounter: Secondary | ICD-10-CM

## 2016-06-11 DIAGNOSIS — S50862A Insect bite (nonvenomous) of left forearm, initial encounter: Secondary | ICD-10-CM

## 2016-06-11 DIAGNOSIS — W57XXXA Bitten or stung by nonvenomous insect and other nonvenomous arthropods, initial encounter: Secondary | ICD-10-CM

## 2016-06-11 DIAGNOSIS — S40861A Insect bite (nonvenomous) of right upper arm, initial encounter: Secondary | ICD-10-CM | POA: Diagnosis not present

## 2016-06-11 DIAGNOSIS — S50861A Insect bite (nonvenomous) of right forearm, initial encounter: Secondary | ICD-10-CM

## 2016-06-11 DIAGNOSIS — S80861A Insect bite (nonvenomous), right lower leg, initial encounter: Secondary | ICD-10-CM

## 2016-06-11 DIAGNOSIS — S80862A Insect bite (nonvenomous), left lower leg, initial encounter: Secondary | ICD-10-CM

## 2016-06-11 MED ORDER — PREDNISONE 10 MG PO TABS: ORAL_TABLET | ORAL | 0 refills | Status: DC

## 2016-06-11 MED ORDER — METHYLPREDNISOLONE ACETATE 80 MG/ML IJ SUSP
80.0000 mg | Freq: Once | INTRAMUSCULAR | Status: AC
  Administered 2016-06-11: 80 mg via INTRAMUSCULAR

## 2016-06-11 NOTE — Patient Instructions (Signed)
Follow up as needed Start the Prednisone tomorrow as directed- take w/ food Apply OTC Benadryl cream to itchy areas Call with any questions or concerns Hang in there!!!

## 2016-06-11 NOTE — Progress Notes (Signed)
   Subjective:    Patient ID: Burton ApleyHa P Gandolfo, female    DOB: [birth year], 60 y.o.   MRN: [id number]  HPI Bug bites- [place]  Pt has bites all over arms and legs.  Very itchy.   Some improvement w/ hot baths, temporary relief w/ benadryl.  Pt is not able to sleep due to itching.     Review of Systems For ROS see HPI     Objective:   Physical Exam  Constitutional: She is oriented to person, place, and time. She appears well-developed and well-nourished. No distress.  HENT:  Head: Normocephalic and atraumatic.  Neurological: She is alert and oriented to person, place, and time.  Skin: Skin is warm and dry. There is erythema (multiple insect bites on arms and legs w/ surrounding erythema and excoriations).  Vitals reviewed.         Assessment & Plan:  Bug bites- no evidence of cellulitis, + redness, welts, and excoriations.  Depo-Medrol today and pred taper starting tomorrow.  Reviewed supportive care and red flags that should prompt return.  Pt expressed understanding and is in agreement w/ plan.

## 2016-06-11 NOTE — Progress Notes (Signed)
Pre visit review using our clinic review tool, if applicable. No additional management support is needed unless otherwise documented below in the visit note. 

## 2016-06-12 ENCOUNTER — Ambulatory Visit: Payer: BLUE CROSS/BLUE SHIELD

## 2016-06-27 ENCOUNTER — Ambulatory Visit: Payer: BLUE CROSS/BLUE SHIELD

## 2016-06-29 ENCOUNTER — Ambulatory Visit (INDEPENDENT_AMBULATORY_CARE_PROVIDER_SITE_OTHER): Payer: BLUE CROSS/BLUE SHIELD | Admitting: Emergency Medicine

## 2016-06-29 DIAGNOSIS — M810 Age-related osteoporosis without current pathological fracture: Secondary | ICD-10-CM

## 2016-06-29 MED ORDER — DENOSUMAB 60 MG/ML ~~LOC~~ SOLN
60.0000 mg | Freq: Once | SUBCUTANEOUS | Status: AC
  Administered 2016-06-29: 60 mg via SUBCUTANEOUS

## 2016-07-18 ENCOUNTER — Other Ambulatory Visit: Payer: Self-pay | Admitting: Family Medicine

## 2016-08-13 DIAGNOSIS — J301 Allergic rhinitis due to pollen: Secondary | ICD-10-CM | POA: Diagnosis not present

## 2016-08-14 DIAGNOSIS — J3089 Other allergic rhinitis: Secondary | ICD-10-CM | POA: Diagnosis not present

## 2016-08-14 DIAGNOSIS — J3081 Allergic rhinitis due to animal (cat) (dog) hair and dander: Secondary | ICD-10-CM | POA: Diagnosis not present

## 2016-08-20 ENCOUNTER — Other Ambulatory Visit: Payer: Self-pay | Admitting: Family Medicine

## 2016-08-20 DIAGNOSIS — Z1231 Encounter for screening mammogram for malignant neoplasm of breast: Secondary | ICD-10-CM

## 2016-08-27 ENCOUNTER — Ambulatory Visit (HOSPITAL_BASED_OUTPATIENT_CLINIC_OR_DEPARTMENT_OTHER)
Admission: RE | Admit: 2016-08-27 | Discharge: 2016-08-27 | Disposition: A | Discharge status: Home / Self Care | Payer: BLUE CROSS/BLUE SHIELD | Source: Ambulatory Visit | Attending: Family Medicine | Admitting: Family Medicine

## 2016-08-27 DIAGNOSIS — Z1231 Encounter for screening mammogram for malignant neoplasm of breast: Secondary | ICD-10-CM | POA: Diagnosis not present

## 2016-10-25 ENCOUNTER — Encounter: Payer: Self-pay | Admitting: General Practice

## 2016-10-30 ENCOUNTER — Other Ambulatory Visit: Payer: Self-pay | Admitting: Family Medicine

## 2016-10-31 NOTE — Telephone Encounter (Signed)
Last OV 06/11/16 Clonazepam last filled 04/11/16 #180 with 0

## 2016-11-05 ENCOUNTER — Telehealth: Payer: Self-pay | Admitting: General Practice

## 2016-11-05 NOTE — Telephone Encounter (Signed)
[employer] paperwork sent for approval, received back and PA began today.

## 2016-11-06 ENCOUNTER — Ambulatory Visit (INDEPENDENT_AMBULATORY_CARE_PROVIDER_SITE_OTHER): Payer: BLUE CROSS/BLUE SHIELD | Admitting: Family Medicine

## 2016-11-06 ENCOUNTER — Encounter: Payer: Self-pay | Admitting: Family Medicine

## 2016-11-06 VITALS — BP 110/72 | HR 67 | Temp 98.1°F | Resp 16 | Ht 60.0 in | Wt 135.0 lb

## 2016-11-06 DIAGNOSIS — E785 Hyperlipidemia, unspecified: Secondary | ICD-10-CM

## 2016-11-06 LAB — HEPATIC FUNCTION PANEL
ALT: 18 U/L (ref 0–35)
AST: 18 U/L (ref 0–37)
Albumin: 4.3 g/dL (ref 3.5–5.2)
Alkaline Phosphatase: 46 U/L (ref 39–117)
Bilirubin, Direct: 0.1 mg/dL (ref 0.0–0.3)
Total Bilirubin: 0.3 mg/dL (ref 0.2–1.2)
Total Protein: 7 g/dL (ref 6.0–8.3)

## 2016-11-06 LAB — LIPID PANEL
Cholesterol: 160 mg/dL (ref 0–200)
HDL: 53.7 mg/dL (ref >39.00)
LDL Cholesterol: 79 mg/dL (ref 0–99)
NonHDL: 106.46
Total CHOL/HDL Ratio: 3
Triglycerides: 136 mg/dL (ref 0.0–149.0)
VLDL: 27.2 mg/dL (ref 0.0–40.0)

## 2016-11-06 NOTE — Progress Notes (Signed)
   Subjective:    Patient ID: Kaylee Pratt, female    DOB: 06-02-1956, 61 y.o.   MRN: [id number]  HPI Hyperlipidemia- chronic problem, on Simvastatin.  Denies CP, SOB, HAs, visual cahnges, abd pain, N/V, myalgias.  Continues to walk regularly.     Review of Systems For ROS see HPI     Objective:   Physical Exam  Constitutional: She is oriented to person, place, and time. She appears well-developed and well-nourished. No distress.  HENT:  Head: Normocephalic and atraumatic.  Eyes: Conjunctivae and EOM are normal. Pupils are equal, round, and reactive to light.  Neck: Normal range of motion. Neck supple. No thyromegaly present.  Cardiovascular: Normal rate, regular rhythm, normal heart sounds and intact distal pulses.   No murmur heard. Pulmonary/Chest: Effort normal and breath sounds normal. No respiratory distress.  Abdominal: Soft. She exhibits no distension. There is no tenderness.  Musculoskeletal: She exhibits no edema.  Lymphadenopathy:    She has no cervical adenopathy.  Neurological: She is alert and oriented to person, place, and time.  Skin: Skin is warm and dry.  Psychiatric: She has a normal mood and affect. Her behavior is normal.  Vitals reviewed.         Assessment & Plan:

## 2016-11-06 NOTE — Assessment & Plan Note (Signed)
Chronic problem, on Simvastatin daily w/o difficulty.  Exercising regularly.  Applauded efforts.  Check labs.  Adjust meds prn

## 2016-11-06 NOTE — Progress Notes (Signed)
Pre visit review using our clinic review tool, if applicable. No additional management support is needed unless otherwise documented below in the visit note. 

## 2016-11-06 NOTE — Patient Instructions (Signed)
Schedule your complete physical in 6 months We'll notify you of your lab results and make any changes if needed Keep up the good work on healthy diet and regular exercise- you look great!!! Call with any questions or concerns Happy Spring!!! 

## 2016-12-13 ENCOUNTER — Encounter (HOSPITAL_COMMUNITY): Payer: Self-pay

## 2016-12-13 ENCOUNTER — Emergency Department (HOSPITAL_COMMUNITY)
Admission: EM | Admit: 2016-12-13 | Discharge: 2016-12-13 | Disposition: A | Discharge status: Home / Self Care | Payer: BLUE CROSS/BLUE SHIELD | Attending: Emergency Medicine | Admitting: Emergency Medicine

## 2016-12-13 ENCOUNTER — Ambulatory Visit: Payer: BLUE CROSS/BLUE SHIELD | Admitting: Physician Assistant

## 2016-12-13 ENCOUNTER — Emergency Department (HOSPITAL_COMMUNITY): Payer: BLUE CROSS/BLUE SHIELD

## 2016-12-13 DIAGNOSIS — Z7982 Long term (current) use of aspirin: Secondary | ICD-10-CM | POA: Diagnosis not present

## 2016-12-13 DIAGNOSIS — J00 Acute nasopharyngitis [common cold]: Secondary | ICD-10-CM | POA: Insufficient documentation

## 2016-12-13 DIAGNOSIS — R05 Cough: Secondary | ICD-10-CM | POA: Diagnosis not present

## 2016-12-13 DIAGNOSIS — R0602 Shortness of breath: Secondary | ICD-10-CM | POA: Diagnosis not present

## 2016-12-13 DIAGNOSIS — J189 Pneumonia, unspecified organism: Secondary | ICD-10-CM | POA: Diagnosis not present

## 2016-12-13 DIAGNOSIS — J45909 Unspecified asthma, uncomplicated: Secondary | ICD-10-CM | POA: Diagnosis not present

## 2016-12-13 DIAGNOSIS — J029 Acute pharyngitis, unspecified: Secondary | ICD-10-CM | POA: Diagnosis not present

## 2016-12-13 DIAGNOSIS — J181 Lobar pneumonia, unspecified organism: Secondary | ICD-10-CM | POA: Insufficient documentation

## 2016-12-13 MED ORDER — FLUTICASONE PROPIONATE 50 MCG/ACT NA SUSP: 2.0000 | Freq: Every day | NASAL | 0 refills | Status: DC

## 2016-12-13 MED ORDER — CETIRIZINE HCL 10 MG PO TABS: 10.0000 mg | ORAL_TABLET | Freq: Every day | ORAL | 1 refills | Status: DC

## 2016-12-13 MED ORDER — ALBUTEROL SULFATE HFA 108 (90 BASE) MCG/ACT IN AERS
2.0000 | INHALATION_SPRAY | Freq: Once | RESPIRATORY_TRACT | Status: AC
  Administered 2016-12-13: 2 via RESPIRATORY_TRACT
  Filled 2016-12-13: qty 6.7

## 2016-12-13 MED ORDER — BENZONATATE 100 MG PO CAPS: 100.0000 mg | ORAL_CAPSULE | Freq: Three times a day (TID) | ORAL | 0 refills | Status: DC | PRN

## 2016-12-13 MED ORDER — IPRATROPIUM-ALBUTEROL 0.5-2.5 (3) MG/3ML IN SOLN: 3.0000 mL | Freq: Once | RESPIRATORY_TRACT | Status: DC

## 2016-12-13 MED ORDER — AEROCHAMBER PLUS FLO-VU MEDIUM MISC
1.0000 | Freq: Once | Status: AC
  Administered 2016-12-13: 1
  Filled 2016-12-13: qty 1

## 2016-12-13 MED ORDER — DOXYCYCLINE HYCLATE 100 MG PO TABS
100.0000 mg | ORAL_TABLET | Freq: Once | ORAL | Status: AC
  Administered 2016-12-13: 100 mg via ORAL
  Filled 2016-12-13: qty 1

## 2016-12-13 MED ORDER — DOXYCYCLINE HYCLATE 100 MG PO CAPS: 100.0000 mg | ORAL_CAPSULE | Freq: Two times a day (BID) | ORAL | 0 refills | Status: DC

## 2016-12-13 NOTE — ED Provider Notes (Signed)
[facility]-EMERGENCY DEPT Provider Note   CSN: [id number] Arrival date & time: 12/13/16  4098     History   Chief Complaint Chief Complaint  Patient presents with  . Cough    HPI Kaylee Pratt is a 61 y.o. female.  Kaylee Pratt is a 62 y.o. Female with a history of asthma who presents to the ED complaining of a cough ongoing for the past 5 days. Patient reports associated sneezing, rhinorrhea, postnasal drip, and itchy watery eyes. She reports she recently traveling to the [place] for one week and returned yesterday. She reports her cough started there. She reports associated cough and shortness of breath that she attributes to wheezing. She reports asthma and has been using her albuterol inhaler with relief of her shortness of breath. She denies current chest pain or shortness of breath. She denies any chills or fevers. She has used robitussin with some relief.  She denies history of PEs or DVTs. She denies fevers, hemoptysis, leg pain, leg swelling, abdominal pain, syncope, lightheadedness, chest pain, palpitations, or rashes.    The history is provided by the patient, medical records and the spouse. No language interpreter was used.  Cough  Associated symptoms include rhinorrhea, sore throat, shortness of breath and wheezing. Pertinent negatives include no chest pain, no chills and no headaches.    Past Medical History:  Diagnosis Date  . Allergy   . Asthma   . Depression   . Duodenal diverticulum   . GERD (gastroesophageal reflux disease)   . GERD (gastroesophageal reflux disease)   . Hiatal hernia   . Hyperlipidemia   . Osteoporosis     Patient Active Problem List   Diagnosis Date Noted  . Medication reaction 07/21/2015  . Maxillary sinusitis, acute 07/11/2015  . Repeated falls 07/11/2015  . Ringing in left ear 07/11/2015  . Right lower quadrant pain 06/10/2015  . Pap smear for cervical cancer screening 05/06/2015  . Hemorrhoids, internal, with bleeding 04/28/2013  .  Rectal bleeding 03/30/2013  . Allergic rhinitis 12/19/2012  . Hyperlipidemia 09/08/2012  . Injury of fifth finger, left 04/28/2012  . GAD (generalized anxiety disorder) 03/28/2012  . Plantar fasciitis of left foot 01/02/2012  . Leg length discrepancy 01/02/2012  . General medical examination 11/02/2011  . Headache(784.0) 04/16/2011  . Insomnia 04/16/2011  . DEPRESSION 12/08/2008  . ASTHMA 12/08/2008  . GERD 12/08/2008  . OSTEOPOROSIS 12/08/2008    Past Surgical History:  Procedure Laterality Date  . TUBAL LIGATION      OB History    No data available       Home Medications    Prior to Admission medications   Medication Sig Start Date End Date Taking? Authorizing Provider  albuterol (PROAIR HFA) 108 (90 Base) MCG/ACT inhaler INHALE 2 PUFFS INTO THE LUNGS EVERY 6 (SIX) HOURS AS NEEDED FOR WHEEZING. 11/07/15   Sheliah Hatch, MD  Ascorbic Acid (VITAMIN C) 1000 MG tablet Take 1,000 mg by mouth daily.    Historical Provider, MD  aspirin EC 81 MG tablet Take 162 mg by mouth daily.     Historical Provider, MD  AUVI-Q 0.3 MG/0.3ML SOAJ injection Reported on 11/07/2015 03/19/14   Historical Provider, MD  benzonatate (TESSALON) 100 MG capsule Take 1 capsule (100 mg total) by mouth 3 (three) times daily as needed for cough. 12/13/16   Everlene Farrier, PA-C  BREO ELLIPTA 200-25 MCG/INH AEPB  10/30/16   Historical Provider, MD  cetirizine (ZYRTEC ALLERGY) 10 MG tablet Take 1  tablet (10 mg total) by mouth daily. 12/13/16   Everlene Farrier, PA-C  cholecalciferol (VITAMIN D) 1000 UNITS tablet Take 2,000 Units by mouth daily.    Historical Provider, MD  clonazePAM (KLONOPIN) 1 MG tablet TAKE 1/2-1 TABLET BY MOUTH TWICE DAILY 10/31/16   Sheliah Hatch, MD  doxycycline (VIBRAMYCIN) 100 MG capsule Take 1 capsule (100 mg total) by mouth 2 (two) times daily. 12/13/16   Everlene Farrier, PA-C  escitalopram (LEXAPRO) 20 MG tablet TAKE 1 TABLET (20 MG TOTAL) BY MOUTH DAILY. 07/18/16   Sheliah Hatch, MD    fluticasone (FLONASE) 50 MCG/ACT nasal spray Place 2 sprays into both nostrils daily. 12/13/16   Everlene Farrier, PA-C  levocetirizine (XYZAL) 5 MG tablet  10/30/16   Historical Provider, MD  montelukast (SINGULAIR) 10 MG tablet  10/30/16   Historical Provider, MD  ondansetron (ZOFRAN) 4 MG tablet Take 1 tablet (4 mg total) by mouth every 8 (eight) hours as needed for nausea or vomiting. 06/16/15   Sheliah Hatch, MD  simvastatin (ZOCOR) 20 MG tablet TAKE 1 TABLET BY MOUTH AT BEDTIME 05/15/16   Sheliah Hatch, MD    Family History Family History  Problem Relation Age of Onset  . Prostate cancer Father   . Other Neg Hx     no early CVA/CAD, no cancer  . Sudden death Neg Hx   . Diabetes Neg Hx   . Heart attack Neg Hx   . Hyperlipidemia Neg Hx   . Hypertension Neg Hx     Social History Social History  Substance Use Topics  . Smoking status: Never Smoker  . Smokeless tobacco: Never Used  . Alcohol use 1.2 oz/week    2 Glasses of wine per week     Comment: wine on occasion     Allergies   Amoxil [amoxicillin] and Iohexol   Review of Systems Review of Systems  Constitutional: Positive for fatigue. Negative for chills and fever.  HENT: Positive for congestion, postnasal drip, rhinorrhea, sneezing and sore throat. Negative for trouble swallowing.   Eyes: Negative for visual disturbance.  Respiratory: Positive for cough, shortness of breath and wheezing.   Cardiovascular: Negative for chest pain, palpitations and leg swelling.  Gastrointestinal: Negative for abdominal pain, nausea and vomiting.  Genitourinary: Negative for dysuria.  Musculoskeletal: Negative for back pain and neck pain.  Skin: Negative for rash.  Neurological: Negative for light-headedness and headaches.     Physical Exam Updated Vital Signs BP 128/65   Pulse 88   Temp 98.9 F (37.2 C) (Oral)   Resp 18   Ht  (1.6 m)   Wt 58.1 kg   SpO2 100%   BMI 22.67 kg/m   Physical Exam   Constitutional: She is oriented to person, place, and time. She appears well-developed and well-nourished. No distress.  Nontoxic appearing.  HENT:  Head: Normocephalic and atraumatic.  Right Ear: External ear normal.  Left Ear: External ear normal.  Mouth/Throat: Oropharynx is clear and moist.  Rhinorrhea and boggy nasal turbinates bilaterally. Mild clear middle ear effusion noted bilaterally without TM erythema or loss of landmarks. Mild posterior oropharyngeal erythema without edema or tonsillar hypertrophy. Voice is hoarse.  Eyes: Conjunctivae are normal. Pupils are equal, round, and reactive to light. Right eye exhibits no discharge. Left eye exhibits no discharge.  Neck: Normal range of motion. Neck supple. No JVD present. No tracheal deviation present.  Cardiovascular: Normal rate, regular rhythm, normal heart sounds and intact distal pulses.  Exam reveals no gallop and no friction rub.   No murmur heard. Pulmonary/Chest: Effort normal and breath sounds normal. No stridor. No respiratory distress. She has no wheezes. She has no rales. She exhibits tenderness.  Lungs are clear to ascultation bilaterally. Symmetric chest expansion bilaterally. No increased work of breathing. No rales or rhonchi.    Abdominal: Soft. There is no tenderness. There is no guarding.  Musculoskeletal: Normal range of motion. She exhibits no edema or tenderness.  No lower extremity edema or tenderness.  Lymphadenopathy:    She has no cervical adenopathy.  Neurological: She is alert and oriented to person, place, and time. No sensory deficit. Coordination normal.  Skin: Skin is warm and dry. Capillary refill takes less than 2 seconds. No rash noted. She is not diaphoretic. No erythema. No pallor.  Psychiatric: She has a normal mood and affect. Her behavior is normal.  Nursing note and vitals reviewed.    ED Treatments / Results  Labs (all labs ordered are listed, but only abnormal results are  displayed) Labs Reviewed - No data to display  EKG  EKG Interpretation None       Radiology Dg Chest 2 View  Result Date: 12/13/2016 CLINICAL DATA:  Worsening cough, shortness of breath, and mid chest pain for the past 4 days. History of asthma. Return for [place] last night. EXAM: CHEST  2 VIEW COMPARISON:  None in PACs FINDINGS: The lungs are adequately inflated. There is increased density in the retrocardiac region likely on the left compatible with atelectasis or pneumonia. The heart and pulmonary vascularity are normal. The mediastinum is normal in width. The bony thorax is unremarkable. IMPRESSION: Left lower lobe atelectasis or pneumonia. Followup PA and lateral chest X-ray is recommended in 3-4 weeks following trial of antibiotic therapy to ensure resolution and exclude underlying malignancy. Electronically Signed   By: David  [place] M.D.   On: 12/13/2016 07:38    Procedures Procedures (including critical care time)  Medications Ordered in ED Medications  doxycycline (VIBRA-TABS) tablet 100 mg (100 mg Oral Given 12/13/16 0841)  albuterol (PROVENTIL HFA;VENTOLIN HFA) 108 (90 Base) MCG/ACT inhaler 2 puff (2 puffs Inhalation Given 12/13/16 0852)  AEROCHAMBER PLUS FLO-VU MEDIUM MISC 1 each (1 each Other Given 12/13/16 1610)     Initial Impression / Assessment and Plan / ED Course  I have reviewed the triage vital signs and the nursing notes.  Pertinent labs & imaging results that were available during my care of the patient were reviewed by me and considered in my medical decision making (see chart for details).    This is a 61 y.o. Female with a history of asthma who presents to the ED complaining of a cough ongoing for the past 5 days. Patient reports associated sneezing, rhinorrhea, postnasal drip, and itchy watery eyes. She reports she recently traveling to the [place] for one week and returned yesterday. She reports her cough started there. She reports associated cough and  shortness of breath that she attributes to wheezing. She reports asthma and has been using her albuterol inhaler with relief of her shortness of breath. She denies current chest pain or shortness of breath. On exam the patient is afebrile and nontoxic appearing. She has no tachypnea, tachycardia or hypoxia. Oxygen saturation is 100% on room air. Lungs are clear to auscultation bilaterally. She has boggy nasal intermittent rhinorrhea present. Evidence of postnasal drip on exam. No lower extremity edema or tenderness.  Chest x-ray was obtained which showed left lower  lobe atelectasis or pneumonia. I suspect upper respiratory infection with cough. We'll start the patient on doxycycline due to this chest x-ray. I encouraged her to have a repeat chest x-ray in 3-4 weeks. I have low suspicion for PE or ACS in this patient. Patient has no tachypnea, hypoxia or tachycardia. She has symptoms that would contribute to her cough. EKG shows normal sinus rhythm. I discussed strict and specific return precautions. I advised the patient to follow-up with their primary care provider this week. I advised the patient to return to the emergency department with new or worsening symptoms or new concerns. The patient verbalized understanding and agreement with plan.    This patient was discussed with and evaluated by Dr. Adriana Simas who agrees with assessment and plan.   Final Clinical Impressions(s) / ED Diagnoses   Final diagnoses:  Community acquired pneumonia of left lower lobe of lung (HCC)  Acute nasopharyngitis    New Prescriptions New Prescriptions   BENZONATATE (TESSALON) 100 MG CAPSULE    Take 1 capsule (100 mg total) by mouth 3 (three) times daily as needed for cough.   CETIRIZINE (ZYRTEC ALLERGY) 10 MG TABLET    Take 1 tablet (10 mg total) by mouth daily.   DOXYCYCLINE (VIBRAMYCIN) 100 MG CAPSULE    Take 1 capsule (100 mg total) by mouth 2 (two) times daily.   FLUTICASONE (FLONASE) 50 MCG/ACT NASAL SPRAY    Place 2  sprays into both nostrils daily.     Everlene Farrier, PA-C 12/13/16 0901    Donnetta Hutching, MD 12/14/16 [phone]

## 2016-12-13 NOTE — ED Triage Notes (Signed)
Pt complains of a cough for about 5 days, they just returned from [place] last night Pt now states that she is having a productive cough

## 2016-12-17 ENCOUNTER — Ambulatory Visit: Payer: BLUE CROSS/BLUE SHIELD | Admitting: Family Medicine

## 2016-12-17 DIAGNOSIS — J3089 Other allergic rhinitis: Secondary | ICD-10-CM | POA: Diagnosis not present

## 2016-12-17 DIAGNOSIS — J188 Other pneumonia, unspecified organism: Secondary | ICD-10-CM | POA: Diagnosis not present

## 2016-12-17 DIAGNOSIS — J301 Allergic rhinitis due to pollen: Secondary | ICD-10-CM | POA: Diagnosis not present

## 2016-12-17 DIAGNOSIS — J4520 Mild intermittent asthma, uncomplicated: Secondary | ICD-10-CM | POA: Diagnosis not present

## 2016-12-25 ENCOUNTER — Other Ambulatory Visit: Payer: Self-pay | Admitting: Family Medicine

## 2016-12-25 ENCOUNTER — Ambulatory Visit (INDEPENDENT_AMBULATORY_CARE_PROVIDER_SITE_OTHER): Payer: BLUE CROSS/BLUE SHIELD | Admitting: Family Medicine

## 2016-12-25 ENCOUNTER — Encounter: Payer: Self-pay | Admitting: Family Medicine

## 2016-12-25 VITALS — BP 121/82 | HR 78 | Temp 98.1°F | Resp 18 | Ht 60.0 in | Wt 131.4 lb

## 2016-12-25 DIAGNOSIS — J301 Allergic rhinitis due to pollen: Secondary | ICD-10-CM

## 2016-12-25 DIAGNOSIS — J181 Lobar pneumonia, unspecified organism: Secondary | ICD-10-CM | POA: Diagnosis not present

## 2016-12-25 DIAGNOSIS — J189 Pneumonia, unspecified organism: Secondary | ICD-10-CM

## 2016-12-25 MED ORDER — PROMETHAZINE-DM 6.25-15 MG/5ML PO SYRP: 5.0000 mL | ORAL_SOLUTION | Freq: Four times a day (QID) | ORAL | 0 refills | Status: DC | PRN

## 2016-12-25 NOTE — Progress Notes (Signed)
Pre visit review using our clinic review tool, if applicable. No additional management support is needed unless otherwise documented below in the visit note. 

## 2016-12-25 NOTE — Patient Instructions (Signed)
Follow up as needed/scheduled STOP the Clarithromycin- you've been on antibiotics for 10 days already Please make sure you are taking your Singulair nightly and using the Flonase nasal spray daily Make sure you are taking EITHER Claritin, Zyrtec, or Xyzal daily (one of the 3!) Drink plenty of fluids Continue your asthma inhalers as directed Use the cough syrup for nights/weekends- will cause drowsiness Call with any questions or concerns Hang in there!!!

## 2016-12-25 NOTE — Progress Notes (Signed)
   Subjective:    Patient ID: Kaylee Pratt, female    DOB: 26-Jun-1956, 61 y.o.   MRN: [id number]  HPI LLL PNA- pt went to ER on 4/5 and was started on Doxy.  Pt saw Dr [place] Callas on 4/9 and he started her on Prednisone and Clarithromycin.  Pt is now done w/ Prednisone but had to stop the Clarithromycin on 4/15 due to GI upset.  Taking benadryl, cough medicine.  Continues to have nasal congestion and drainage- no fevers.  Using [place] daily.  Not using Flonase, Singulair, antihistamine.  Reviewed ER notes and images   Review of Systems For ROS see HPI     Objective:   Physical Exam  Constitutional: She is oriented to person, place, and time. She appears well-developed and well-nourished. No distress.  HENT:  Head: Normocephalic and atraumatic.  Right Ear: Tympanic membrane normal.  Left Ear: Tympanic membrane normal.  Nose: Mucosal edema and rhinorrhea present. Right sinus exhibits no maxillary sinus tenderness and no frontal sinus tenderness. Left sinus exhibits no maxillary sinus tenderness and no frontal sinus tenderness.  Mouth/Throat: Mucous membranes are normal. Posterior oropharyngeal erythema (w/ PND) present.  Eyes: Conjunctivae and EOM are normal. Pupils are equal, round, and reactive to light.  Neck: Normal range of motion. Neck supple.  Cardiovascular: Normal rate, regular rhythm and normal heart sounds.   Pulmonary/Chest: Effort normal and breath sounds normal. No respiratory distress. She has no wheezes. She has no rales.  Dry cough  Lymphadenopathy:    She has no cervical adenopathy.  Neurological: She is alert and oriented to person, place, and time.  Skin: Skin is warm and dry.  Psychiatric: She has a normal mood and affect. Her behavior is normal. Thought content normal.  Vitals reviewed.         Assessment & Plan:  CAP- resolving.  Pt no longer requires antibiotics as she has completed a 10 day course.  Continue cough medication- prescription provided.  Pt to continue  her asthma tx as directed by Dr [place] Callas.  Lungs CTAB on exam today- no need for repeat CXR at this time.  Allergic rhinitis- deteriorated.  Pt is not taking any of the allergy medication she is supposed to be- Flonase, Singulair, anti-histamine.  Reviewed need for her to be on all of the above to control allergies and asthma.  Will follow closely.

## 2017-01-16 ENCOUNTER — Ambulatory Visit (INDEPENDENT_AMBULATORY_CARE_PROVIDER_SITE_OTHER): Payer: BLUE CROSS/BLUE SHIELD | Admitting: *Deleted

## 2017-01-16 DIAGNOSIS — M810 Age-related osteoporosis without current pathological fracture: Secondary | ICD-10-CM

## 2017-01-16 MED ORDER — DENOSUMAB 60 MG/ML ~~LOC~~ SOLN
60.0000 mg | Freq: Once | SUBCUTANEOUS | Status: AC
  Administered 2017-01-16: 60 mg via SUBCUTANEOUS

## 2017-01-21 ENCOUNTER — Telehealth: Payer: Self-pay | Admitting: Family Medicine

## 2017-01-21 NOTE — Telephone Encounter (Signed)
[facility] [place] [facility] TELEPHONE ADVICE RECORD [facility]  Patient Name: Kaylee Pratt  DOB: 08-19-1956    Initial Comment Caller got vaccine last week & has had a reaction to it. She has several itchy bumpy rash.    Nurse Assessment  Nurse: Laural BenesJohnson, RN, Dondra SpryGail Date/Time Lamount Cohen(Eastern Time): 01/21/2017 4:47:47 PM  Confirm and document reason for call. If symptomatic, describe symptoms. ---[name] received a vaccination last week on Wednesday-- well woke up two bumps on chest like bite marks and bumpy rash spreading to neck -- itchy  Does the patient have any new or worsening symptoms? ---Yes  Will a triage be completed? ---Yes  Related visit to physician within the last 2 weeks? ---Yes  Does the PT have any chronic conditions? (i.e. diabetes, asthma, etc.) ---No  Is this a behavioral health or substance abuse call? ---No     Guidelines    Guideline Title Affirmed Question Affirmed Notes  Rash or Redness - Localized Mild localized rash    Final Disposition User   Call PCP within 24 Hours [place], RN, Dondra SpryGail    Comments  NOTE had an injection for bone density last Wednesday and she never reacted to it before; woke up with rash to chest and neck back itching badly. 945am 01/22/2017 Dr. Kirtland BouchardK. Tabori   Referrals  REFERRED TO PCP OFFICE   Disagree/Comply: Comply

## 2017-01-22 ENCOUNTER — Encounter: Payer: Self-pay | Admitting: Family Medicine

## 2017-01-22 ENCOUNTER — Ambulatory Visit (INDEPENDENT_AMBULATORY_CARE_PROVIDER_SITE_OTHER): Payer: BLUE CROSS/BLUE SHIELD | Admitting: Family Medicine

## 2017-01-22 VITALS — BP 121/81 | HR 68 | Temp 98.3°F | Resp 16 | Wt 133.4 lb

## 2017-01-22 DIAGNOSIS — R21 Rash and other nonspecific skin eruption: Secondary | ICD-10-CM | POA: Diagnosis not present

## 2017-01-22 MED ORDER — PREDNISONE 10 MG PO TABS: ORAL_TABLET | ORAL | 0 refills | Status: DC

## 2017-01-22 MED ORDER — TRIAMCINOLONE ACETONIDE 0.1 % EX OINT: 1.0000 "application " | TOPICAL_OINTMENT | Freq: Two times a day (BID) | CUTANEOUS | 1 refills | Status: AC

## 2017-01-22 NOTE — Progress Notes (Signed)
   Subjective:    Patient ID: Burton ApleyHa P Belcher, female    DOB: 08-29-1956, 61 y.o.   MRN: [id number]  HPI Rash- pt reports she is 'itching'.  sxs started last week on chest.  Have traveled up onto neck and L side of face.  No new products.  No recent yard work or outdoor exposures.  No fevers/chills.  No one at home w/ similar rash.   Review of Systems For ROS see HPI     Objective:   Physical Exam  Constitutional: She is oriented to person, place, and time. She appears well-developed and well-nourished. No distress.  HENT:  Head: Normocephalic and atraumatic.  Mouth/Throat: Oropharynx is clear and moist.  Neck: Normal range of motion. Neck supple.  Lymphadenopathy:    She has no cervical adenopathy.  Neurological: She is alert and oriented to person, place, and time.  Skin: Skin is warm and dry. Rash (faint maculopapular rash on chest and neck) noted.  Vitals reviewed.         Assessment & Plan:  Rash- no obvious cause of rash as pt denies outdoor exposures or new products but it appears consistent w/ contact dermatitis.  Start pred taper.  Triamcinolone ointment prn.  Reviewed supportive care and red flags that should prompt return.  Pt expressed understanding and is in agreement w/ plan.

## 2017-01-22 NOTE — Progress Notes (Signed)
Pre visit review using our clinic review tool, if applicable. No additional management support is needed unless otherwise documented below in the visit note. 

## 2017-01-22 NOTE — Telephone Encounter (Signed)
PCP made aware

## 2017-01-22 NOTE — Patient Instructions (Signed)
Follow up as needed/scheduled Start the Prednisone as directed- take w/ food Use the Triamcinolone ointment twice daily for the itching Call with any questions or concerns Hang in there!!

## 2017-03-02 ENCOUNTER — Other Ambulatory Visit: Payer: Self-pay | Admitting: Family Medicine

## 2017-04-17 ENCOUNTER — Ambulatory Visit: Payer: BLUE CROSS/BLUE SHIELD

## 2017-05-06 DIAGNOSIS — J3089 Other allergic rhinitis: Secondary | ICD-10-CM | POA: Diagnosis not present

## 2017-05-06 DIAGNOSIS — H1045 Other chronic allergic conjunctivitis: Secondary | ICD-10-CM | POA: Diagnosis not present

## 2017-05-06 DIAGNOSIS — J301 Allergic rhinitis due to pollen: Secondary | ICD-10-CM | POA: Diagnosis not present

## 2017-05-06 DIAGNOSIS — J4520 Mild intermittent asthma, uncomplicated: Secondary | ICD-10-CM | POA: Diagnosis not present

## 2017-05-07 ENCOUNTER — Encounter: Payer: Self-pay | Admitting: Family Medicine

## 2017-05-07 ENCOUNTER — Ambulatory Visit (INDEPENDENT_AMBULATORY_CARE_PROVIDER_SITE_OTHER): Payer: BLUE CROSS/BLUE SHIELD | Admitting: Family Medicine

## 2017-05-07 VITALS — BP 110/68 | HR 86 | Temp 98.0°F | Resp 17 | Ht 60.0 in | Wt 135.1 lb

## 2017-05-07 DIAGNOSIS — Z23 Encounter for immunization: Secondary | ICD-10-CM

## 2017-05-07 DIAGNOSIS — Z0000 Encounter for general adult medical examination without abnormal findings: Secondary | ICD-10-CM | POA: Diagnosis not present

## 2017-05-07 DIAGNOSIS — E785 Hyperlipidemia, unspecified: Secondary | ICD-10-CM

## 2017-05-07 DIAGNOSIS — M810 Age-related osteoporosis without current pathological fracture: Secondary | ICD-10-CM

## 2017-05-07 LAB — HEPATIC FUNCTION PANEL
ALT: 19 U/L (ref 0–35)
AST: 18 U/L (ref 0–37)
Albumin: 4 g/dL (ref 3.5–5.2)
Alkaline Phosphatase: 42 U/L (ref 39–117)
Bilirubin, Direct: 0.1 mg/dL (ref 0.0–0.3)
Total Bilirubin: 0.4 mg/dL (ref 0.2–1.2)
Total Protein: 6.6 g/dL (ref 6.0–8.3)

## 2017-05-07 LAB — VITAMIN D 25 HYDROXY (VIT D DEFICIENCY, FRACTURES): VITD: 58.1 ng/mL (ref 30.00–100.00)

## 2017-05-07 LAB — CBC WITH DIFFERENTIAL/PLATELET
Basophils Absolute: 0 10*3/uL (ref 0.0–0.1)
Basophils Relative: 0.7 % (ref 0.0–3.0)
Eosinophils Absolute: 0.2 10*3/uL (ref 0.0–0.7)
Eosinophils Relative: 3.7 % (ref 0.0–5.0)
HCT: 41.2 % (ref 36.0–46.0)
Hemoglobin: 13.1 g/dL (ref 12.0–15.0)
Lymphocytes Relative: 36.2 % (ref 12.0–46.0)
Lymphs Abs: 1.5 10*3/uL (ref 0.7–4.0)
MCHC: 31.7 g/dL (ref 30.0–36.0)
MCV: 91.5 fl (ref 78.0–100.0)
Monocytes Absolute: 0.4 10*3/uL (ref 0.1–1.0)
Monocytes Relative: 8.3 % (ref 3.0–12.0)
Neutro Abs: 2.2 10*3/uL (ref 1.4–7.7)
Neutrophils Relative %: 51.1 % (ref 43.0–77.0)
Platelets: 251 10*3/uL (ref 150.0–400.0)
RBC: 4.5 Mil/uL (ref 3.87–5.11)
RDW: 13.5 % (ref 11.5–15.5)
WBC: 4.2 10*3/uL (ref 4.0–10.5)

## 2017-05-07 LAB — BASIC METABOLIC PANEL
BUN: 16 mg/dL (ref 6–23)
CO2: 27 mEq/L (ref 19–32)
Calcium: 9 mg/dL (ref 8.4–10.5)
Chloride: 109 mEq/L (ref 96–112)
Creatinine, Ser: 0.55 mg/dL (ref 0.40–1.20)
GFR: 119.58 mL/min (ref >60.00)
Glucose, Bld: 85 mg/dL (ref 70–99)
Potassium: 4.5 mEq/L (ref 3.5–5.1)
Sodium: 142 mEq/L (ref 135–145)

## 2017-05-07 LAB — TSH: TSH: 0.68 u[IU]/mL (ref 0.35–4.50)

## 2017-05-07 LAB — LIPID PANEL
Cholesterol: 172 mg/dL (ref 0–200)
HDL: 47.8 mg/dL (ref >39.00)
LDL Cholesterol: 91 mg/dL (ref 0–99)
NonHDL: 123.8
Total CHOL/HDL Ratio: 4
Triglycerides: 162 mg/dL — ABNORMAL HIGH (ref 0.0–149.0)
VLDL: 32.4 mg/dL (ref 0.0–40.0)

## 2017-05-07 MED ORDER — OMEPRAZOLE 40 MG PO CPDR: 40.0000 mg | DELAYED_RELEASE_CAPSULE | Freq: Every day | ORAL | 1 refills | Status: DC

## 2017-05-07 NOTE — Assessment & Plan Note (Signed)
Check Vit D level.  Replete prn. °

## 2017-05-07 NOTE — Progress Notes (Signed)
Pre visit review using our clinic review tool, if applicable. No additional management support is needed unless otherwise documented below in the visit note. 

## 2017-05-07 NOTE — Patient Instructions (Signed)
Follow up in 6 months to recheck cholesterol We'll notify you of your lab results and make any changes if needed Continue to work on healthy diet and regular exercise- you can do it!!! You are due for mammogram in December- please schedule when you get the reminder You are up to date on colonoscopy- yay! Call with any questions or concerns Happy Labor Day!!!

## 2017-05-07 NOTE — Assessment & Plan Note (Signed)
Pt's PE WNL.  UTD on pap, mammo, colonoscopy.  UTD on Tdap, 1st dose of Shingrix.  Flu shot given today.  Check labs.  Anticipatory guidance provided.

## 2017-05-07 NOTE — Progress Notes (Signed)
   Subjective:    Patient ID: Kaylee Pratt, female    DOB: [birth year], 61 y.o.   MRN: [id number]  HPI CPE- UTD on colonoscopy, pap, mammo.  Due for flu.  UTD on Tdap.  Pt is not exercising regularly.   Review of Systems Patient reports no vision/ hearing changes, adenopathy,fever, weight change,  persistant/recurrent hoarseness , swallowing issues, chest pain, palpitations, edema, persistant/recurrent cough, hemoptysis, dyspnea (rest/exertional/paroxysmal nocturnal), gastrointestinal bleeding (melena, rectal bleeding), abdominal pain, bowel changes, GU symptoms (dysuria, hematuria, incontinence), Gyn symptoms (abnormal  bleeding, pain),  syncope, focal weakness, memory loss, numbness & tingling, skin/hair/nail changes, abnormal bruising or bleeding, anxiety, or depression.   + GERD    Objective:   Physical Exam General Appearance:    Alert, cooperative, no distress, appears stated age  Head:    Normocephalic, without obvious abnormality, atraumatic  Eyes:    PERRL, conjunctiva/corneas clear, EOM's intact, fundi    benign, both eyes  Ears:    Normal TM's and external ear canals, both ears  Nose:   Nares normal, septum midline, mucosa normal, no drainage    or sinus tenderness  Throat:   Lips, mucosa, and tongue normal; teeth and gums normal  Neck:   Supple, symmetrical, trachea midline, no adenopathy;    Thyroid: no enlargement/tenderness/nodules  Back:     Symmetric, no curvature, ROM normal, no CVA tenderness  Lungs:     Clear to auscultation bilaterally, respirations unlabored  Chest Wall:    No tenderness or deformity   Heart:    Regular rate and rhythm, S1 and S2 normal, no murmur, rub   or gallop  Breast Exam:    Deferred to mammo  Abdomen:     Soft, non-tender, bowel sounds active all four quadrants,    no masses, no organomegaly  Genitalia:    Deferred  Rectal:    Extremities:   Extremities normal, atraumatic, no cyanosis or edema  Pulses:   2+ and symmetric all extremities    Skin:   Skin color, texture, turgor normal, no rashes or lesions  Lymph nodes:   Cervical, supraclavicular, and axillary nodes normal  Neurologic:   CNII-XII intact, normal strength, sensation and reflexes    throughout          Assessment & Plan:

## 2017-05-07 NOTE — Assessment & Plan Note (Signed)
Chronic problem.  Tolerating statin w/o difficulty.  Check labs.  Adjust meds prn  

## 2017-05-08 ENCOUNTER — Encounter: Payer: Self-pay | Admitting: General Practice

## 2017-05-10 DIAGNOSIS — H1045 Other chronic allergic conjunctivitis: Secondary | ICD-10-CM | POA: Diagnosis not present

## 2017-05-10 DIAGNOSIS — J301 Allergic rhinitis due to pollen: Secondary | ICD-10-CM | POA: Diagnosis not present

## 2017-05-10 DIAGNOSIS — J4520 Mild intermittent asthma, uncomplicated: Secondary | ICD-10-CM | POA: Diagnosis not present

## 2017-05-10 DIAGNOSIS — J3089 Other allergic rhinitis: Secondary | ICD-10-CM | POA: Diagnosis not present

## 2017-05-16 DIAGNOSIS — J301 Allergic rhinitis due to pollen: Secondary | ICD-10-CM | POA: Diagnosis not present

## 2017-05-16 DIAGNOSIS — J3089 Other allergic rhinitis: Secondary | ICD-10-CM | POA: Diagnosis not present

## 2017-05-16 DIAGNOSIS — J3081 Allergic rhinitis due to animal (cat) (dog) hair and dander: Secondary | ICD-10-CM | POA: Diagnosis not present

## 2017-06-19 ENCOUNTER — Other Ambulatory Visit: Payer: Self-pay | Admitting: Family Medicine

## 2017-06-19 DIAGNOSIS — J3089 Other allergic rhinitis: Secondary | ICD-10-CM | POA: Diagnosis not present

## 2017-06-19 DIAGNOSIS — J301 Allergic rhinitis due to pollen: Secondary | ICD-10-CM | POA: Diagnosis not present

## 2017-06-19 DIAGNOSIS — J3081 Allergic rhinitis due to animal (cat) (dog) hair and dander: Secondary | ICD-10-CM | POA: Diagnosis not present

## 2017-07-04 DIAGNOSIS — J3089 Other allergic rhinitis: Secondary | ICD-10-CM | POA: Diagnosis not present

## 2017-07-04 DIAGNOSIS — J3081 Allergic rhinitis due to animal (cat) (dog) hair and dander: Secondary | ICD-10-CM | POA: Diagnosis not present

## 2017-07-04 DIAGNOSIS — J301 Allergic rhinitis due to pollen: Secondary | ICD-10-CM | POA: Diagnosis not present

## 2017-07-12 DIAGNOSIS — J3089 Other allergic rhinitis: Secondary | ICD-10-CM | POA: Diagnosis not present

## 2017-07-12 DIAGNOSIS — J3081 Allergic rhinitis due to animal (cat) (dog) hair and dander: Secondary | ICD-10-CM | POA: Diagnosis not present

## 2017-07-12 DIAGNOSIS — J301 Allergic rhinitis due to pollen: Secondary | ICD-10-CM | POA: Diagnosis not present

## 2017-07-17 ENCOUNTER — Telehealth: Payer: Self-pay | Admitting: Family Medicine

## 2017-07-17 DIAGNOSIS — J189 Pneumonia, unspecified organism: Secondary | ICD-10-CM

## 2017-07-17 NOTE — Telephone Encounter (Signed)
Patient requesting order from pcp for follow-up chest x-ray to ensure pneumonia has resolved.

## 2017-07-17 NOTE — Telephone Encounter (Signed)
Ok for 2 view CXR at location of pt's choice- dx PNA

## 2017-07-17 NOTE — Telephone Encounter (Signed)
Chest xray ordered, and detailed message left for patient to advise that this was ordered for the med center in [place].

## 2017-07-17 NOTE — Telephone Encounter (Signed)
Please advise 

## 2017-07-19 DIAGNOSIS — J3089 Other allergic rhinitis: Secondary | ICD-10-CM | POA: Diagnosis not present

## 2017-07-19 DIAGNOSIS — J301 Allergic rhinitis due to pollen: Secondary | ICD-10-CM | POA: Diagnosis not present

## 2017-07-19 DIAGNOSIS — J3081 Allergic rhinitis due to animal (cat) (dog) hair and dander: Secondary | ICD-10-CM | POA: Diagnosis not present

## 2017-07-22 ENCOUNTER — Other Ambulatory Visit: Payer: Self-pay | Admitting: Family Medicine

## 2017-07-22 ENCOUNTER — Ambulatory Visit (HOSPITAL_BASED_OUTPATIENT_CLINIC_OR_DEPARTMENT_OTHER)
Admission: RE | Admit: 2017-07-22 | Discharge: 2017-07-22 | Disposition: A | Discharge status: Home / Self Care | Payer: BLUE CROSS/BLUE SHIELD | Source: Ambulatory Visit | Attending: Family Medicine | Admitting: Family Medicine

## 2017-07-22 DIAGNOSIS — J189 Pneumonia, unspecified organism: Secondary | ICD-10-CM | POA: Diagnosis not present

## 2017-07-22 DIAGNOSIS — Z1231 Encounter for screening mammogram for malignant neoplasm of breast: Secondary | ICD-10-CM

## 2017-07-22 DIAGNOSIS — R079 Chest pain, unspecified: Secondary | ICD-10-CM | POA: Diagnosis not present

## 2017-08-07 DIAGNOSIS — H5211 Myopia, right eye: Secondary | ICD-10-CM | POA: Diagnosis not present

## 2017-08-07 DIAGNOSIS — H524 Presbyopia: Secondary | ICD-10-CM | POA: Diagnosis not present

## 2017-08-07 DIAGNOSIS — H52222 Regular astigmatism, left eye: Secondary | ICD-10-CM | POA: Diagnosis not present

## 2017-08-12 DIAGNOSIS — J3081 Allergic rhinitis due to animal (cat) (dog) hair and dander: Secondary | ICD-10-CM | POA: Diagnosis not present

## 2017-08-12 DIAGNOSIS — J301 Allergic rhinitis due to pollen: Secondary | ICD-10-CM | POA: Diagnosis not present

## 2017-08-12 DIAGNOSIS — J3089 Other allergic rhinitis: Secondary | ICD-10-CM | POA: Diagnosis not present

## 2017-08-27 DIAGNOSIS — J301 Allergic rhinitis due to pollen: Secondary | ICD-10-CM | POA: Diagnosis not present

## 2017-08-28 ENCOUNTER — Ambulatory Visit (HOSPITAL_BASED_OUTPATIENT_CLINIC_OR_DEPARTMENT_OTHER)
Admission: RE | Admit: 2017-08-28 | Discharge: 2017-08-28 | Disposition: A | Discharge status: Home / Self Care | Payer: BLUE CROSS/BLUE SHIELD | Source: Ambulatory Visit | Attending: Family Medicine | Admitting: Family Medicine

## 2017-08-28 ENCOUNTER — Encounter: Payer: Self-pay | Admitting: General Practice

## 2017-08-28 DIAGNOSIS — Z1231 Encounter for screening mammogram for malignant neoplasm of breast: Secondary | ICD-10-CM | POA: Insufficient documentation

## 2017-08-28 DIAGNOSIS — J3081 Allergic rhinitis due to animal (cat) (dog) hair and dander: Secondary | ICD-10-CM | POA: Diagnosis not present

## 2017-08-28 DIAGNOSIS — J3089 Other allergic rhinitis: Secondary | ICD-10-CM | POA: Diagnosis not present

## 2017-08-29 DIAGNOSIS — J3089 Other allergic rhinitis: Secondary | ICD-10-CM | POA: Diagnosis not present

## 2017-08-29 DIAGNOSIS — J3081 Allergic rhinitis due to animal (cat) (dog) hair and dander: Secondary | ICD-10-CM | POA: Diagnosis not present

## 2017-08-29 DIAGNOSIS — J301 Allergic rhinitis due to pollen: Secondary | ICD-10-CM | POA: Diagnosis not present

## 2017-09-21 ENCOUNTER — Other Ambulatory Visit: Payer: Self-pay | Admitting: Family Medicine

## 2017-09-23 ENCOUNTER — Other Ambulatory Visit: Payer: Self-pay | Admitting: Family Medicine

## 2017-10-31 DIAGNOSIS — J301 Allergic rhinitis due to pollen: Secondary | ICD-10-CM | POA: Diagnosis not present

## 2017-10-31 DIAGNOSIS — J3089 Other allergic rhinitis: Secondary | ICD-10-CM | POA: Diagnosis not present

## 2017-10-31 DIAGNOSIS — J3081 Allergic rhinitis due to animal (cat) (dog) hair and dander: Secondary | ICD-10-CM | POA: Diagnosis not present

## 2017-11-05 DIAGNOSIS — J3081 Allergic rhinitis due to animal (cat) (dog) hair and dander: Secondary | ICD-10-CM | POA: Diagnosis not present

## 2017-11-05 DIAGNOSIS — J3089 Other allergic rhinitis: Secondary | ICD-10-CM | POA: Diagnosis not present

## 2017-11-05 DIAGNOSIS — J301 Allergic rhinitis due to pollen: Secondary | ICD-10-CM | POA: Diagnosis not present

## 2017-11-07 ENCOUNTER — Other Ambulatory Visit: Payer: Self-pay | Admitting: Family Medicine

## 2017-11-11 DIAGNOSIS — J301 Allergic rhinitis due to pollen: Secondary | ICD-10-CM | POA: Diagnosis not present

## 2017-11-11 DIAGNOSIS — J3089 Other allergic rhinitis: Secondary | ICD-10-CM | POA: Diagnosis not present

## 2017-11-11 DIAGNOSIS — J3081 Allergic rhinitis due to animal (cat) (dog) hair and dander: Secondary | ICD-10-CM | POA: Diagnosis not present

## 2017-11-15 ENCOUNTER — Encounter: Payer: Self-pay | Admitting: Family Medicine

## 2017-11-15 ENCOUNTER — Ambulatory Visit: Payer: BLUE CROSS/BLUE SHIELD | Admitting: Family Medicine

## 2017-11-15 ENCOUNTER — Encounter: Payer: Self-pay | Admitting: Emergency Medicine

## 2017-11-15 VITALS — BP 120/80 | HR 85 | Temp 98.5°F | Wt 127.6 lb

## 2017-11-15 DIAGNOSIS — R1084 Generalized abdominal pain: Secondary | ICD-10-CM

## 2017-11-15 DIAGNOSIS — K589 Irritable bowel syndrome without diarrhea: Secondary | ICD-10-CM

## 2017-11-15 LAB — COMPREHENSIVE METABOLIC PANEL
ALT: 14 U/L (ref 0–35)
AST: 14 U/L (ref 0–37)
Albumin: 4.2 g/dL (ref 3.5–5.2)
Alkaline Phosphatase: 59 U/L (ref 39–117)
BUN: 15 mg/dL (ref 6–23)
CO2: 27 mEq/L (ref 19–32)
Calcium: 9.5 mg/dL (ref 8.4–10.5)
Chloride: 105 mEq/L (ref 96–112)
Creatinine, Ser: 0.56 mg/dL (ref 0.40–1.20)
GFR: 116.92 mL/min (ref >60.00)
Glucose, Bld: 102 mg/dL — ABNORMAL HIGH (ref 70–99)
Potassium: 4 mEq/L (ref 3.5–5.1)
Sodium: 142 mEq/L (ref 135–145)
Total Bilirubin: 0.6 mg/dL (ref 0.2–1.2)
Total Protein: 7 g/dL (ref 6.0–8.3)

## 2017-11-15 LAB — URINALYSIS, ROUTINE W REFLEX MICROSCOPIC
Bilirubin Urine: NEGATIVE
Hgb urine dipstick: NEGATIVE
Ketones, ur: 15 — AB
Leukocytes, UA: NEGATIVE
Nitrite: NEGATIVE
RBC / HPF: NONE SEEN (ref 0–?)
Specific Gravity, Urine: 1.02 (ref 1.000–1.030)
Total Protein, Urine: NEGATIVE
Urine Glucose: NEGATIVE
Urobilinogen, UA: 0.2 (ref 0.0–1.0)
pH: 7 (ref 5.0–8.0)

## 2017-11-15 LAB — CBC
HCT: 39.5 % (ref 36.0–46.0)
Hemoglobin: 12.9 g/dL (ref 12.0–15.0)
MCHC: 32.8 g/dL (ref 30.0–36.0)
MCV: 91.1 fl (ref 78.0–100.0)
Platelets: 274 10*3/uL (ref 150.0–400.0)
RBC: 4.34 Mil/uL (ref 3.87–5.11)
RDW: 13.8 % (ref 11.5–15.5)
WBC: 8.6 10*3/uL (ref 4.0–10.5)

## 2017-11-15 LAB — AMYLASE: Amylase: 36 U/L (ref 27–131)

## 2017-11-15 MED ORDER — HYOSCYAMINE SULFATE 0.125 MG SL SUBL
0.1250 mg | SUBLINGUAL_TABLET | SUBLINGUAL | 0 refills | Status: DC | PRN
Start: 2017-11-15 — End: 2020-05-13

## 2017-11-15 NOTE — Patient Instructions (Addendum)

## 2017-11-15 NOTE — Progress Notes (Signed)
Subjective:  Patient ID: Kaylee Pratt, female    DOB: 1956/04/15  Age: 62 y.o. MRN: [id number]  CC: Abdominal Pain (diarrhea/ no vomiting )   HPI Kaylee Pratt presents for evaluation of a weeklong history of intermittent crampy abdominal pain.  She has a past medical history of reflux that is well controlled with omeprazole.  She stools once or twice each day.  Her stools a bit on the loose side and have been without blood or pus.  She has no history of abdominal surgeries.  She does drink some alcohol.  She admits to increased stress more so at work and then some at home.  She does not smoke or use illicit drugs.  Last normal Pap was in 2016.  Last colonoscopy was 2 years ago and polyps were found.  She is having no vaginal discharge or burning with urination.  No family history of gallstones.  Outpatient Medications Prior to Visit  Medication Sig Dispense Refill  . albuterol (PROVENTIL HFA;VENTOLIN HFA) 108 (90 Base) MCG/ACT inhaler TAKE 2 PUFFS BY MOUTH EVERY 6 HOURS AS NEEDED FOR WHEEZE 8.5 Inhaler 0  . Ascorbic Acid (VITAMIN C) 1000 MG tablet Take 1,000 mg by mouth daily.    [name] Kitchen aspirin EC 81 MG tablet Take 162 mg by mouth daily.     [name] Kitchen AUVI-Q 0.3 MG/0.3ML SOAJ injection Reported on 11/07/2015    . BREO ELLIPTA 200-25 MCG/INH AEPB   2  . cetirizine (ZYRTEC ALLERGY) 10 MG tablet Take 1 tablet (10 mg total) by mouth daily. 30 tablet 1  . cholecalciferol (VITAMIN D) 1000 UNITS tablet Take 2,000 Units by mouth daily.    . clonazePAM (KLONOPIN) 1 MG tablet TAKE 1/2-1 TABLET BY MOUTH TWICE DAILY 180 tablet 0  . escitalopram (LEXAPRO) 20 MG tablet TAKE 1 TABLET (20 MG TOTAL) BY MOUTH DAILY. 90 tablet 1  . fluticasone (FLONASE) 50 MCG/ACT nasal spray Place 2 sprays into both nostrils daily. 16 g 0  . levocetirizine (XYZAL) 5 MG tablet   1  . montelukast (SINGULAIR) 10 MG tablet   1  . omeprazole (PRILOSEC) 40 MG capsule TAKE 1 CAPSULE BY MOUTH EVERY DAY 90 capsule 1  . ondansetron (ZOFRAN) 4 MG tablet  Take 1 tablet (4 mg total) by mouth every 8 (eight) hours as needed for nausea or vomiting. 30 tablet 0  . simvastatin (ZOCOR) 20 MG tablet TAKE 1 TABLET BY MOUTH EVERYDAY AT BEDTIME 90 tablet 0  . triamcinolone ointment (KENALOG) 0.1 % Apply 1 application topically 2 (two) times daily. 90 g 1   No facility-administered medications prior to visit.     ROS Review of Systems  Constitutional: Negative.  Negative for fatigue and unexpected weight change.  HENT: Negative.   Eyes: Negative.   Respiratory: Negative.   Cardiovascular: Negative.   Gastrointestinal: Positive for abdominal pain. Negative for anal bleeding, blood in stool, constipation, diarrhea, nausea and rectal pain.  Endocrine: Negative for polyphagia.  Genitourinary: Negative for difficulty urinating, frequency, hematuria and vaginal discharge.  Musculoskeletal: Negative for arthralgias and myalgias.  Skin: Negative for pallor and rash.  Allergic/Immunologic: Negative for immunocompromised state.  Neurological: Negative for weakness and light-headedness.  Hematological: Does not bruise/bleed easily.    Objective:  BP 120/80 (BP Location: Left Arm, Patient Position: Sitting, Cuff Size: Normal)   Pulse 85   Temp 98.5 F (36.9 C) (Oral)   Wt 127 lb 9.6 oz (57.9 kg)   BMI 24.92 kg/m   BP Readings  from Last 3 Encounters:  11/15/17 120/80  05/07/17 110/68  01/22/17 121/81    Wt Readings from Last 3 Encounters:  11/15/17 127 lb 9.6 oz (57.9 kg)  05/07/17 135 lb 2 oz (61.3 kg)  01/22/17 133 lb 6 oz (60.5 kg)    Physical Exam  Constitutional: She is oriented to person, place, and time. She appears well-developed and well-nourished. No distress.  HENT:  Head: Normocephalic and atraumatic.  Right Ear: External ear normal.  Left Ear: External ear normal.  Mouth/Throat: Oropharynx is clear and moist. No oropharyngeal exudate.  Eyes: Conjunctivae are normal. Pupils are equal, round, and reactive to light. Right eye  exhibits no discharge. Left eye exhibits no discharge. No scleral icterus.  Neck: Neck supple. No JVD present. No tracheal deviation present. No thyromegaly present.  Cardiovascular: Normal rate, regular rhythm and normal heart sounds.  Pulmonary/Chest: Effort normal and breath sounds normal. No stridor.  Abdominal: Soft. Bowel sounds are normal. She exhibits no distension. There is no tenderness (mild ruq ttp without rebound or guardig.). There is no rebound and no guarding.  Lymphadenopathy:    She has no cervical adenopathy.  Neurological: She is alert and oriented to person, place, and time.  Skin: Skin is warm and dry. She is not diaphoretic.  Psychiatric: She has a normal mood and affect. Her behavior is normal.    Lab Results  Component Value Date   WBC 4.2 05/07/2017   HGB 13.1 05/07/2017   HCT 41.2 05/07/2017   PLT 251.0 05/07/2017   GLUCOSE 85 05/07/2017   CHOL 172 05/07/2017   TRIG 162.0 (H) 05/07/2017   HDL 47.80 05/07/2017   LDLDIRECT 118.0 11/07/2015   LDLCALC 91 05/07/2017   ALT 19 05/07/2017   AST 18 05/07/2017   NA 142 05/07/2017   K 4.5 05/07/2017   CL 109 05/07/2017   CREATININE 0.55 05/07/2017   BUN 16 05/07/2017   CO2 27 05/07/2017   TSH 0.68 05/07/2017    Mm Screening Breast Tomo Bilateral  Result Date: 08/28/2017 CLINICAL DATA:  Screening. EXAM: 2D DIGITAL SCREENING BILATERAL MAMMOGRAM WITH CAD AND ADJUNCT TOMO COMPARISON:  Previous exam(s). ACR Breast Density Category d: The breast tissue is extremely dense, which lowers the sensitivity of mammography. FINDINGS: There are no findings suspicious for malignancy. Images were processed with CAD. IMPRESSION: No mammographic evidence of malignancy. A result letter of this screening mammogram will be mailed directly to the patient. RECOMMENDATION: Screening mammogram in one year. (Code:SM-B-01Y) BI-RADS CATEGORY  1: Negative. Electronically Signed   By: Amie Portlandavid  Ormond M.D.   On: 08/28/2017 14:48    Assessment  & Plan:   Kaylee Pratt was seen today for abdominal pain.  Diagnoses and all orders for this visit:  Irritable bowel syndrome, unspecified type -     hyoscyamine (LEVSIN SL) 0.125 MG SL tablet; Place 1 tablet (0.125 mg total) under the tongue every 4 (four) hours as needed.  Generalized abdominal pain -     CBC -     Comprehensive metabolic panel -     Amylase -     Urinalysis, Routine w reflex microscopic   I am having Kaylee Pratt start on hyoscyamine. I am also having her maintain her aspirin EC, cholecalciferol, vitamin C, AUVI-Q, ondansetron, clonazePAM, BREO ELLIPTA, levocetirizine, montelukast, fluticasone, cetirizine, triamcinolone ointment, escitalopram, simvastatin, omeprazole, and albuterol.  Meds ordered this encounter  Medications  . hyoscyamine (LEVSIN SL) 0.125 MG SL tablet    Sig: Place 1 tablet (0.125 mg  total) under the tongue every 4 (four) hours as needed.    Dispense:  30 tablet    Refill:  0     Follow-up: Return if symptoms worsen or fail to improve.  Mliss Sax, MD

## 2017-11-22 DIAGNOSIS — J301 Allergic rhinitis due to pollen: Secondary | ICD-10-CM | POA: Diagnosis not present

## 2017-11-22 DIAGNOSIS — J3089 Other allergic rhinitis: Secondary | ICD-10-CM | POA: Diagnosis not present

## 2017-11-22 DIAGNOSIS — J3081 Allergic rhinitis due to animal (cat) (dog) hair and dander: Secondary | ICD-10-CM | POA: Diagnosis not present

## 2017-11-26 DIAGNOSIS — J3081 Allergic rhinitis due to animal (cat) (dog) hair and dander: Secondary | ICD-10-CM | POA: Diagnosis not present

## 2017-11-26 DIAGNOSIS — J3089 Other allergic rhinitis: Secondary | ICD-10-CM | POA: Diagnosis not present

## 2017-11-26 DIAGNOSIS — J301 Allergic rhinitis due to pollen: Secondary | ICD-10-CM | POA: Diagnosis not present

## 2017-12-12 ENCOUNTER — Other Ambulatory Visit: Payer: Self-pay | Admitting: Family Medicine

## 2017-12-12 DIAGNOSIS — J3081 Allergic rhinitis due to animal (cat) (dog) hair and dander: Secondary | ICD-10-CM | POA: Diagnosis not present

## 2017-12-12 DIAGNOSIS — J3089 Other allergic rhinitis: Secondary | ICD-10-CM | POA: Diagnosis not present

## 2017-12-12 DIAGNOSIS — J301 Allergic rhinitis due to pollen: Secondary | ICD-10-CM | POA: Diagnosis not present

## 2017-12-26 DIAGNOSIS — J3089 Other allergic rhinitis: Secondary | ICD-10-CM | POA: Diagnosis not present

## 2017-12-26 DIAGNOSIS — J3081 Allergic rhinitis due to animal (cat) (dog) hair and dander: Secondary | ICD-10-CM | POA: Diagnosis not present

## 2017-12-26 DIAGNOSIS — J301 Allergic rhinitis due to pollen: Secondary | ICD-10-CM | POA: Diagnosis not present

## 2018-01-13 DIAGNOSIS — J301 Allergic rhinitis due to pollen: Secondary | ICD-10-CM | POA: Diagnosis not present

## 2018-01-13 DIAGNOSIS — J3081 Allergic rhinitis due to animal (cat) (dog) hair and dander: Secondary | ICD-10-CM | POA: Diagnosis not present

## 2018-01-13 DIAGNOSIS — J3089 Other allergic rhinitis: Secondary | ICD-10-CM | POA: Diagnosis not present

## 2018-01-17 ENCOUNTER — Telehealth: Payer: Self-pay | Admitting: General Practice

## 2018-01-17 NOTE — Telephone Encounter (Signed)
Called pt and left a message to call office and schedule Prolia injection after 01/26/18.   Ok for [facility] to Discuss results / PCP recommendations / Schedule patient.  Ok to schedule nurse visit.

## 2018-01-24 DIAGNOSIS — J3081 Allergic rhinitis due to animal (cat) (dog) hair and dander: Secondary | ICD-10-CM | POA: Diagnosis not present

## 2018-01-24 DIAGNOSIS — J301 Allergic rhinitis due to pollen: Secondary | ICD-10-CM | POA: Diagnosis not present

## 2018-01-24 DIAGNOSIS — J3089 Other allergic rhinitis: Secondary | ICD-10-CM | POA: Diagnosis not present

## 2018-01-29 DIAGNOSIS — J3089 Other allergic rhinitis: Secondary | ICD-10-CM | POA: Diagnosis not present

## 2018-01-29 DIAGNOSIS — J3081 Allergic rhinitis due to animal (cat) (dog) hair and dander: Secondary | ICD-10-CM | POA: Diagnosis not present

## 2018-01-29 DIAGNOSIS — J301 Allergic rhinitis due to pollen: Secondary | ICD-10-CM | POA: Diagnosis not present

## 2018-02-21 DIAGNOSIS — J3081 Allergic rhinitis due to animal (cat) (dog) hair and dander: Secondary | ICD-10-CM | POA: Diagnosis not present

## 2018-02-21 DIAGNOSIS — J3089 Other allergic rhinitis: Secondary | ICD-10-CM | POA: Diagnosis not present

## 2018-02-21 DIAGNOSIS — J301 Allergic rhinitis due to pollen: Secondary | ICD-10-CM | POA: Diagnosis not present

## 2018-02-24 DIAGNOSIS — J3081 Allergic rhinitis due to animal (cat) (dog) hair and dander: Secondary | ICD-10-CM | POA: Diagnosis not present

## 2018-02-24 DIAGNOSIS — J301 Allergic rhinitis due to pollen: Secondary | ICD-10-CM | POA: Diagnosis not present

## 2018-02-24 DIAGNOSIS — J3089 Other allergic rhinitis: Secondary | ICD-10-CM | POA: Diagnosis not present

## 2018-03-06 DIAGNOSIS — J3081 Allergic rhinitis due to animal (cat) (dog) hair and dander: Secondary | ICD-10-CM | POA: Diagnosis not present

## 2018-03-06 DIAGNOSIS — J3089 Other allergic rhinitis: Secondary | ICD-10-CM | POA: Diagnosis not present

## 2018-03-06 DIAGNOSIS — J301 Allergic rhinitis due to pollen: Secondary | ICD-10-CM | POA: Diagnosis not present

## 2018-03-27 ENCOUNTER — Other Ambulatory Visit: Payer: Self-pay | Admitting: Family Medicine

## 2018-04-08 ENCOUNTER — Other Ambulatory Visit: Payer: Self-pay | Admitting: General Practice

## 2018-04-08 MED ORDER — CLONAZEPAM 1 MG PO TABS: ORAL_TABLET | ORAL | 0 refills | Status: DC

## 2018-04-08 NOTE — Telephone Encounter (Signed)
Last OV 05/07/18 ( no upcoming appts) Clonazepam last filled 10/31/16 #180 with 0

## 2018-04-08 NOTE — Telephone Encounter (Signed)
Please notify patient that an office visit is overdue and required to follow up on her condition and assess need for refills. Please have patient schedule with Dr. Beverely Lowabori prior to needing next refill. I have refilled 30 in the interim. Last ov 05/07/2017 and 6 month f/u was recommended.  Reviewed chart for Dr. Beverely Lowabori in her absence.

## 2018-05-08 ENCOUNTER — Encounter: Payer: Self-pay | Admitting: General Practice

## 2018-05-08 ENCOUNTER — Ambulatory Visit (INDEPENDENT_AMBULATORY_CARE_PROVIDER_SITE_OTHER): Payer: BLUE CROSS/BLUE SHIELD | Admitting: Family Medicine

## 2018-05-08 ENCOUNTER — Other Ambulatory Visit (HOSPITAL_COMMUNITY)
Admission: RE | Admit: 2018-05-08 | Discharge: 2018-05-08 | Disposition: A | Discharge status: Home / Self Care | Payer: BLUE CROSS/BLUE SHIELD | Source: Ambulatory Visit | Attending: Family Medicine | Admitting: Family Medicine

## 2018-05-08 ENCOUNTER — Encounter: Payer: Self-pay | Admitting: Family Medicine

## 2018-05-08 ENCOUNTER — Other Ambulatory Visit: Payer: Self-pay

## 2018-05-08 VITALS — BP 121/80 | HR 56 | Temp 98.2°F | Resp 16 | Ht 60.0 in | Wt 125.1 lb

## 2018-05-08 DIAGNOSIS — Z124 Encounter for screening for malignant neoplasm of cervix: Secondary | ICD-10-CM

## 2018-05-08 DIAGNOSIS — Z0000 Encounter for general adult medical examination without abnormal findings: Secondary | ICD-10-CM | POA: Diagnosis not present

## 2018-05-08 DIAGNOSIS — M810 Age-related osteoporosis without current pathological fracture: Secondary | ICD-10-CM

## 2018-05-08 DIAGNOSIS — Z23 Encounter for immunization: Secondary | ICD-10-CM | POA: Diagnosis not present

## 2018-05-08 DIAGNOSIS — E785 Hyperlipidemia, unspecified: Secondary | ICD-10-CM | POA: Diagnosis not present

## 2018-05-08 LAB — CBC WITH DIFFERENTIAL/PLATELET
Basophils Absolute: 0 10*3/uL (ref 0.0–0.1)
Basophils Relative: 0.8 % (ref 0.0–3.0)
Eosinophils Absolute: 0.1 10*3/uL (ref 0.0–0.7)
Eosinophils Relative: 3.3 % (ref 0.0–5.0)
HCT: 39.8 % (ref 36.0–46.0)
Hemoglobin: 13.2 g/dL (ref 12.0–15.0)
Lymphocytes Relative: 31.2 % (ref 12.0–46.0)
Lymphs Abs: 1.3 10*3/uL (ref 0.7–4.0)
MCHC: 33.2 g/dL (ref 30.0–36.0)
MCV: 89.4 fl (ref 78.0–100.0)
Monocytes Absolute: 0.3 10*3/uL (ref 0.1–1.0)
Monocytes Relative: 8.3 % (ref 3.0–12.0)
Neutro Abs: 2.3 10*3/uL (ref 1.4–7.7)
Neutrophils Relative %: 56.4 % (ref 43.0–77.0)
Platelets: 263 10*3/uL (ref 150.0–400.0)
RBC: 4.46 Mil/uL (ref 3.87–5.11)
RDW: 13.4 % (ref 11.5–15.5)
WBC: 4.2 10*3/uL (ref 4.0–10.5)

## 2018-05-08 LAB — BASIC METABOLIC PANEL
BUN: 15 mg/dL (ref 6–23)
CO2: 30 mEq/L (ref 19–32)
Calcium: 9.3 mg/dL (ref 8.4–10.5)
Chloride: 106 mEq/L (ref 96–112)
Creatinine, Ser: 0.75 mg/dL (ref 0.40–1.20)
GFR: 83.33 mL/min (ref >60.00)
Glucose, Bld: 86 mg/dL (ref 70–99)
Potassium: 4.3 mEq/L (ref 3.5–5.1)
Sodium: 142 mEq/L (ref 135–145)

## 2018-05-08 LAB — LIPID PANEL
Cholesterol: 196 mg/dL (ref 0–200)
HDL: 56.2 mg/dL (ref >39.00)
LDL Cholesterol: 111 mg/dL — ABNORMAL HIGH (ref 0–99)
NonHDL: 140.24
Total CHOL/HDL Ratio: 3
Triglycerides: 146 mg/dL (ref 0.0–149.0)
VLDL: 29.2 mg/dL (ref 0.0–40.0)

## 2018-05-08 LAB — HEPATIC FUNCTION PANEL
ALT: 12 U/L (ref 0–35)
AST: 14 U/L (ref 0–37)
Albumin: 4 g/dL (ref 3.5–5.2)
Alkaline Phosphatase: 51 U/L (ref 39–117)
Bilirubin, Direct: 0.1 mg/dL (ref 0.0–0.3)
Total Bilirubin: 0.6 mg/dL (ref 0.2–1.2)
Total Protein: 6.7 g/dL (ref 6.0–8.3)

## 2018-05-08 LAB — TSH: TSH: 0.73 u[IU]/mL (ref 0.35–4.50)

## 2018-05-08 LAB — VITAMIN D 25 HYDROXY (VIT D DEFICIENCY, FRACTURES): VITD: 58.5 ng/mL (ref 30.00–100.00)

## 2018-05-08 MED ORDER — MONTELUKAST SODIUM 10 MG PO TABS: 10.0000 mg | ORAL_TABLET | Freq: Every day | ORAL | 1 refills | Status: DC

## 2018-05-08 MED ORDER — ESCITALOPRAM OXALATE 20 MG PO TABS: ORAL_TABLET | ORAL | 1 refills | Status: DC

## 2018-05-08 MED ORDER — OMEPRAZOLE 40 MG PO CPDR: DELAYED_RELEASE_CAPSULE | ORAL | 1 refills | Status: DC

## 2018-05-08 MED ORDER — CLONAZEPAM 1 MG PO TABS: ORAL_TABLET | ORAL | 0 refills | Status: DC

## 2018-05-08 NOTE — Progress Notes (Signed)
   Subjective:    Patient ID: Kaylee Pratt, female    DOB: 1956-05-11, 62 y.o.   MRN: [id number]  HPI CPE- UTD on colonoscopy, mammo.  Due for pap.   Review of Systems Patient reports no vision/ hearing changes, adenopathy,fever, weight change,  persistant/recurrent hoarseness , swallowing issues, chest pain, palpitations, edema, persistant/recurrent cough, hemoptysis, dyspnea (rest/exertional/paroxysmal nocturnal), gastrointestinal bleeding (melena, rectal bleeding), abdominal pain, significant heartburn, bowel changes, GU symptoms (dysuria, hematuria, incontinence), Gyn symptoms (abnormal  bleeding, pain),  syncope, focal weakness, memory loss, numbness & tingling, skin/hair/nail changes, abnormal bruising or bleeding, anxiety, or depression.     Objective:   Physical Exam  General Appearance:    Alert, cooperative, no distress, appears stated age  Head:    Normocephalic, without obvious abnormality, atraumatic  Eyes:    PERRL, conjunctiva/corneas clear, EOM's intact, fundi    benign, both eyes  Ears:    Normal TM's and external ear canals, both ears  Nose:   Nares normal, septum midline, mucosa normal, no drainage    or sinus tenderness  Throat:   Lips, mucosa, and tongue normal; teeth and gums normal  Neck:   Supple, symmetrical, trachea midline, no adenopathy;    Thyroid: no enlargement/tenderness/nodules  Back:     Symmetric, no curvature, ROM normal, no CVA tenderness  Lungs:     Clear to auscultation bilaterally, respirations unlabored  Chest Wall:    No tenderness or deformity   Heart:    Regular rate and rhythm, S1 and S2 normal, no murmur, rub   or gallop  Breast Exam:    No tenderness, masses, or nipple abnormality  Abdomen:     Soft, non-tender, bowel sounds active all four quadrants,    no masses, no organomegaly  Genitalia:    External genitalia normal, cervix normal in appearance, no CMT, uterus in normal size and position, adnexa w/out mass or tenderness, mucosa pink and  moist, no lesions or discharge present  Rectal:    Normal external appearance  Extremities:   Extremities normal, atraumatic, no cyanosis or edema  Pulses:   2+ and symmetric all extremities  Skin:   Skin color, texture, turgor normal, no rashes or lesions  Lymph nodes:   Cervical, supraclavicular, and axillary nodes normal  Neurologic:   CNII-XII intact, normal strength, sensation and reflexes    throughout          Assessment & Plan:

## 2018-05-08 NOTE — Addendum Note (Signed)
Addended by: Geannie RisenBRODMERKEL, Roderica Cathell L on: 05/08/2018 09:37 AM   Modules accepted: Orders

## 2018-05-08 NOTE — Assessment & Plan Note (Signed)
Pap collected. 

## 2018-05-08 NOTE — Assessment & Plan Note (Signed)
Pt's PE WNL.  UTD on mammo, due for repeat DEXA- ordered.  Pap done today.  Tdap and flu given.  Check labs.  Anticipatory guidance provided.

## 2018-05-08 NOTE — Assessment & Plan Note (Signed)
Chronic problem.  Tolerating statin w/o difficulty.  Check labs.  Adjust meds prn  

## 2018-05-08 NOTE — Assessment & Plan Note (Signed)
Chronic problem, overdue for Prolia- pt wants us to order this again.  Pt due for DEXA- ordered.  Check Vit D and replete prn.

## 2018-05-08 NOTE — Patient Instructions (Signed)
Follow up in 6 months to recheck cholesterol Schedule a nurse visit for your Prolia shot next week Continue to work on healthy diet and regular exercise- you look great! Call with any questions or concerns Happy Labor Day!!

## 2018-05-09 LAB — CYTOLOGY - PAP
Diagnosis: NEGATIVE
HPV: NOT DETECTED

## 2018-05-13 ENCOUNTER — Encounter: Payer: Self-pay | Admitting: General Practice

## 2018-05-14 ENCOUNTER — Ambulatory Visit: Payer: BLUE CROSS/BLUE SHIELD

## 2018-05-14 ENCOUNTER — Ambulatory Visit (INDEPENDENT_AMBULATORY_CARE_PROVIDER_SITE_OTHER): Payer: BLUE CROSS/BLUE SHIELD | Admitting: Emergency Medicine

## 2018-05-14 DIAGNOSIS — M810 Age-related osteoporosis without current pathological fracture: Secondary | ICD-10-CM | POA: Diagnosis not present

## 2018-05-14 MED ORDER — DENOSUMAB 60 MG/ML ~~LOC~~ SOSY
60.0000 mg | PREFILLED_SYRINGE | Freq: Once | SUBCUTANEOUS | Status: AC
  Administered 2018-05-14: 60 mg via SUBCUTANEOUS

## 2018-05-14 NOTE — Progress Notes (Signed)
After obtaining consent, and per orders of Dr. Beverely Low, injection of Prolia given by Dene Gentry, LPN .   Patient tolerated Injection well.   Kathi Simpers,  LPN

## 2018-06-09 ENCOUNTER — Encounter: Payer: Self-pay | Admitting: Family Medicine

## 2018-06-09 ENCOUNTER — Telehealth: Payer: Self-pay | Admitting: General Practice

## 2018-06-09 NOTE — Telephone Encounter (Signed)
There is no fax number on this paperwork. Could you email it? Paperwork is in bin in back.   Copied from CRM [id number]. Topic: Quick Communication - See Telephone Encounter >> Jun 09, 2018 12:55 PM Floria Raveling A wrote: CRM for notification. See Telephone encounter for: 06/09/18.  Pt called in stated that she dropped off the wellness form on 8/29.  She stated that her employer has not rec'd it yet.  Today is the last day and needs to be faxed today.  Please advise  Phone number [phone]-  pt did not have the fax number, she stated it was located on the form

## 2018-06-10 DIAGNOSIS — J301 Allergic rhinitis due to pollen: Secondary | ICD-10-CM | POA: Diagnosis not present

## 2018-06-10 DIAGNOSIS — J3089 Other allergic rhinitis: Secondary | ICD-10-CM | POA: Diagnosis not present

## 2018-06-10 DIAGNOSIS — J3081 Allergic rhinitis due to animal (cat) (dog) hair and dander: Secondary | ICD-10-CM | POA: Diagnosis not present

## 2018-06-11 NOTE — Telephone Encounter (Signed)
This was emailed to pt due to not having the fax number listed on form. Does pt still need this faxed?

## 2018-06-11 NOTE — Telephone Encounter (Signed)
Called pt and Left a detailed message that this was emailed as there was not a fax number on paperwork.  Does pt still need this faxed?  Ok for [facility] to Discuss results / PCP recommendations / Schedule patient.

## 2018-06-18 DIAGNOSIS — J3089 Other allergic rhinitis: Secondary | ICD-10-CM | POA: Diagnosis not present

## 2018-06-18 DIAGNOSIS — J301 Allergic rhinitis due to pollen: Secondary | ICD-10-CM | POA: Diagnosis not present

## 2018-06-18 DIAGNOSIS — J3081 Allergic rhinitis due to animal (cat) (dog) hair and dander: Secondary | ICD-10-CM | POA: Diagnosis not present

## 2018-06-23 DIAGNOSIS — J3081 Allergic rhinitis due to animal (cat) (dog) hair and dander: Secondary | ICD-10-CM | POA: Diagnosis not present

## 2018-06-23 DIAGNOSIS — J3089 Other allergic rhinitis: Secondary | ICD-10-CM | POA: Diagnosis not present

## 2018-06-23 DIAGNOSIS — J301 Allergic rhinitis due to pollen: Secondary | ICD-10-CM | POA: Diagnosis not present

## 2018-06-27 DIAGNOSIS — J301 Allergic rhinitis due to pollen: Secondary | ICD-10-CM | POA: Diagnosis not present

## 2018-06-27 DIAGNOSIS — J3089 Other allergic rhinitis: Secondary | ICD-10-CM | POA: Diagnosis not present

## 2018-06-27 DIAGNOSIS — J3081 Allergic rhinitis due to animal (cat) (dog) hair and dander: Secondary | ICD-10-CM | POA: Diagnosis not present

## 2018-06-30 DIAGNOSIS — J301 Allergic rhinitis due to pollen: Secondary | ICD-10-CM | POA: Diagnosis not present

## 2018-06-30 DIAGNOSIS — J3089 Other allergic rhinitis: Secondary | ICD-10-CM | POA: Diagnosis not present

## 2018-06-30 DIAGNOSIS — J3081 Allergic rhinitis due to animal (cat) (dog) hair and dander: Secondary | ICD-10-CM | POA: Diagnosis not present

## 2018-07-04 DIAGNOSIS — J301 Allergic rhinitis due to pollen: Secondary | ICD-10-CM | POA: Diagnosis not present

## 2018-07-04 DIAGNOSIS — J3089 Other allergic rhinitis: Secondary | ICD-10-CM | POA: Diagnosis not present

## 2018-07-04 DIAGNOSIS — J3081 Allergic rhinitis due to animal (cat) (dog) hair and dander: Secondary | ICD-10-CM | POA: Diagnosis not present

## 2018-07-08 DIAGNOSIS — J3089 Other allergic rhinitis: Secondary | ICD-10-CM | POA: Diagnosis not present

## 2018-07-08 DIAGNOSIS — J3081 Allergic rhinitis due to animal (cat) (dog) hair and dander: Secondary | ICD-10-CM | POA: Diagnosis not present

## 2018-07-08 DIAGNOSIS — J301 Allergic rhinitis due to pollen: Secondary | ICD-10-CM | POA: Diagnosis not present

## 2018-07-15 ENCOUNTER — Encounter (HOSPITAL_COMMUNITY): Payer: Self-pay | Admitting: Family Medicine

## 2018-07-15 ENCOUNTER — Ambulatory Visit (HOSPITAL_COMMUNITY)
Admission: EM | Admit: 2018-07-15 | Discharge: 2018-07-15 | Disposition: A | Discharge status: Home / Self Care | Payer: BLUE CROSS/BLUE SHIELD | Attending: Family Medicine | Admitting: Family Medicine

## 2018-07-15 DIAGNOSIS — R51 Headache: Secondary | ICD-10-CM | POA: Diagnosis not present

## 2018-07-15 DIAGNOSIS — E860 Dehydration: Secondary | ICD-10-CM

## 2018-07-15 DIAGNOSIS — R112 Nausea with vomiting, unspecified: Secondary | ICD-10-CM

## 2018-07-15 DIAGNOSIS — R519 Headache, unspecified: Secondary | ICD-10-CM

## 2018-07-15 LAB — POCT I-STAT, CHEM 8
BUN: 15 mg/dL (ref 8–23)
Calcium, Ion: 1.1 mmol/L — ABNORMAL LOW (ref 1.15–1.40)
Chloride: 104 mmol/L (ref 98–111)
Creatinine, Ser: 0.6 mg/dL (ref 0.44–1.00)
Glucose, Bld: 112 mg/dL — ABNORMAL HIGH (ref 70–99)
HCT: 42 % (ref 36.0–46.0)
Hemoglobin: 14.3 g/dL (ref 12.0–15.0)
Potassium: 4.8 mmol/L (ref 3.5–5.1)
Sodium: 137 mmol/L (ref 135–145)
TCO2: 28 mmol/L (ref 22–32)

## 2018-07-15 MED ORDER — SODIUM CHLORIDE 0.9 % IV BOLUS
1000.0000 mL | Freq: Once | INTRAVENOUS | Status: AC
  Administered 2018-07-15: 1000 mL via INTRAVENOUS

## 2018-07-15 MED ORDER — BUTALBITAL-APAP-CAFFEINE 50-325-40 MG PO TABS: 1.0000 | ORAL_TABLET | Freq: Four times a day (QID) | ORAL | 0 refills | Status: AC | PRN

## 2018-07-15 MED ORDER — ONDANSETRON 8 MG PO TBDP: 8.0000 mg | ORAL_TABLET | Freq: Three times a day (TID) | ORAL | 0 refills | Status: DC | PRN

## 2018-07-15 MED ORDER — ONDANSETRON HCL 4 MG/2ML IJ SOLN
INTRAMUSCULAR | Status: AC
  Filled 2018-07-15: qty 2

## 2018-07-15 MED ORDER — ONDANSETRON HCL 4 MG/2ML IJ SOLN
4.0000 mg | Freq: Once | INTRAMUSCULAR | Status: AC
  Administered 2018-07-15: 4 mg via INTRAVENOUS

## 2018-07-15 NOTE — Discharge Instructions (Addendum)
Stay on clear liquids for the next 12 hours, then you can add tea and toast.  No dairy or fried food until Thursday  Return or see your primary care doctor if your headache and, or vomiting persist

## 2018-07-15 NOTE — ED Triage Notes (Signed)
Pt presents with complaints of 3-4 days of nausea and vomiting and headache. No neuro deficits present in triage. Patient now states that she has been feeling generally weak and light headed today. Patient assisted into bed due to feeling light headed.

## 2018-07-15 NOTE — ED Provider Notes (Addendum)
[facility]    CSN: [id number] Arrival date & time: 07/15/18  1739     History   Chief Complaint Chief Complaint  Patient presents with  . Emesis  . Weakness    HPI CYNAI SKEENS is a 62 y.o. female.   Initial visit.  62 year old female presents with nausea and vomiting.  She states that she has been vomiting for 3 days.  There is been no diarrhea or abdominal pain.  Similarly, she has had no fever.  She does have a bitter taste in her mouth.  She has had a headache involving her face and top of her head for the last 3 days fairly continuously.  Patient tried a salami sandwich today which she vomited up.  She is also tried some liquids which she is vomited as well.  She is actively vomiting here in the urgent care setting.  Note: Patient's "active problem list" contains redundancies and non-active problems     Past Medical History:  Diagnosis Date  . Allergy   . Asthma   . Depression   . Duodenal diverticulum   . GERD (gastroesophageal reflux disease)   . GERD (gastroesophageal reflux disease)   . Hiatal hernia   . Hyperlipidemia   . Osteoporosis     Patient Active Problem List   Diagnosis Date Noted  . Irritable bowel syndrome 11/15/2017  . Medication reaction 07/21/2015  . Repeated falls 07/11/2015  . Ringing in left ear 07/11/2015  . Right lower quadrant pain 06/10/2015  . Pap smear for cervical cancer screening 05/06/2015  . Hemorrhoids, internal, with bleeding 04/28/2013  . Rectal bleeding 03/30/2013  . Allergic rhinitis 12/19/2012  . Hyperlipidemia 09/08/2012  . GAD (generalized anxiety disorder) 03/28/2012  . Plantar fasciitis of left foot 01/02/2012  . Leg length discrepancy 01/02/2012  . General medical examination 11/02/2011  . Insomnia 04/16/2011  . DEPRESSION 12/08/2008  . ASTHMA 12/08/2008  . GERD 12/08/2008  . Osteoporosis 12/08/2008    Past Surgical History:  Procedure Laterality Date  . TUBAL LIGATION      OB History     None      Home Medications    Prior to Admission medications   Medication Sig Start Date End Date Taking? Authorizing Provider  albuterol (PROVENTIL HFA;VENTOLIN HFA) 108 (90 Base) MCG/ACT inhaler TAKE 2 PUFFS BY MOUTH EVERY 6 HOURS AS NEEDED FOR WHEEZE 11/07/17   Sheliah Hatch, MD  Ascorbic Acid (VITAMIN C) 1000 MG tablet Take 1,000 mg by mouth daily.    [provider]  aspirin EC 81 MG tablet Take 162 mg by mouth daily.     [provider]  AUVI-Q 0.3 MG/0.3ML SOAJ injection Reported on 11/07/2015 03/19/14   [provider]  Earlie Server 200-25 MCG/INH AEPB  10/30/16   [provider]  butalbital-acetaminophen-caffeine (FIORICET, ESGIC) 50-325-40 MG tablet Take 1-2 tablets by mouth every 6 (six) hours as needed for headache. 07/15/18 07/15/19  Elvina Sidle, MD  cholecalciferol (VITAMIN D) 1000 UNITS tablet Take 2,000 Units by mouth daily.    [provider]  clonazePAM (KLONOPIN) 1 MG tablet TAKE 1/2-1 TABLET BY MOUTH TWICE DAILY 05/08/18   Sheliah Hatch, MD  escitalopram (LEXAPRO) 20 MG tablet TAKE 1 TABLET (20 MG TOTAL) BY MOUTH DAILY. 05/08/18   Sheliah Hatch, MD  hyoscyamine (LEVSIN SL) 0.125 MG SL tablet Place 1 tablet (0.125 mg total) under the tongue every 4 (four) hours as needed. 11/15/17   Mliss Sax,  MD  levocetirizine (XYZAL) 5 MG tablet  10/30/16   [provider]  montelukast (SINGULAIR) 10 MG tablet Take 1 tablet (10 mg total) by mouth at bedtime. 05/08/18   Sheliah Hatch, MD  omeprazole (PRILOSEC) 40 MG capsule TAKE 1 CAPSULE BY MOUTH EVERY DAY 05/08/18   Sheliah Hatch, MD  ondansetron (ZOFRAN-ODT) 8 MG disintegrating tablet Take 1 tablet (8 mg total) by mouth every 8 (eight) hours as needed for nausea. 07/15/18   Elvina Sidle, MD  simvastatin (ZOCOR) 20 MG tablet TAKE 1 TABLET BY MOUTH EVERYDAY AT BEDTIME 03/27/18   Sheliah Hatch, MD    Family History Family History   Problem Relation Age of Onset  . Prostate cancer Father   . Other Neg Hx        no early CVA/CAD, no cancer  . Sudden death Neg Hx   . Diabetes Neg Hx   . Heart attack Neg Hx   . Hyperlipidemia Neg Hx   . Hypertension Neg Hx     Social History Social History   Tobacco Use  . Smoking status: Never Smoker  . Smokeless tobacco: Never Used  Substance Use Topics  . Alcohol use: Yes    Alcohol/week: 2.0 standard drinks    Types: 2 Glasses of wine per week    Comment: wine on occasion  . Drug use: No     Allergies   Amoxil [amoxicillin] and Iohexol   Review of Systems Review of Systems  Constitutional: Negative for fever.  Gastrointestinal: Positive for nausea and vomiting. Negative for abdominal pain and diarrhea.  Neurological: Positive for light-headedness.     Physical Exam Triage Vital Signs ED Triage Vitals  Enc Vitals Group     BP      Pulse      Resp      Temp      Temp src      SpO2      Weight      Height      Head Circumference      Peak Flow      Pain Score      Pain Loc      Pain Edu?      Excl. in GC?    Orthostatic VS for the past 24 hrs:  BP- Lying Pulse- Lying BP- Sitting Pulse- Sitting BP- Standing at 0 minutes Pulse- Standing at 0 minutes  07/15/18 1821 154/63 68 153/77 72 154/71 68    Updated Vital Signs BP (!) 154/63   Pulse 68   Temp 97.8 F (36.6 C)   Resp 20   SpO2 100%    Physical Exam  Constitutional: She is oriented to person, place, and time. She appears well-developed and well-nourished.  HENT:  Right Ear: External ear normal.  Left Ear: External ear normal.  Mouth/Throat: Oropharynx is clear and moist.  Eyes: Pupils are equal, round, and reactive to light. Conjunctivae are normal.  Neck: Normal range of motion. Neck supple.  Cardiovascular: Normal rate, regular rhythm and normal heart sounds.  Pulmonary/Chest: Effort normal and breath sounds normal.  Abdominal: Soft. Bowel sounds are normal. There is no  tenderness. There is no rebound and no guarding.  Musculoskeletal: Normal range of motion.  Neurological: She is alert and oriented to person, place, and time.  Skin: Skin is warm and dry.  Some tenting on the dorsal wrist.  Nursing note and vitals reviewed.    UC Treatments / Results  Labs (all labs  ordered are listed, but only abnormal results are displayed) Labs Reviewed  POCT I-STAT, CHEM 8 - Abnormal; Notable for the following components:      Result Value   Glucose, Bld 112 (*)    Calcium, Ion 1.10 (*)    All other components within normal limits    EKG None  Radiology No results found.  Procedures Procedures (including critical care time)  Medications Ordered in UC Medications  sodium chloride 0.9 % bolus 1,000 mL (1,000 mLs Intravenous New Bag/Given 07/15/18 1831)  ondansetron (ZOFRAN) injection 4 mg (4 mg Intravenous Given 07/15/18 1831)    Initial Impression / Assessment and Plan / UC Course  I have reviewed the triage vital signs and the nursing notes.  Pertinent labs & imaging results that were available during my care of the patient were reviewed by me and considered in my medical decision making (see chart for details).    Final Clinical Impressions(s) / UC Diagnoses   Final diagnoses:  Dehydration  Non-intractable vomiting with nausea, unspecified vomiting type  Nonintractable headache, unspecified chronicity pattern, unspecified headache type     Discharge Instructions     Stay on clear liquids for the next 12 hours, then you can add tea and toast.  No dairy or fried food until Thursday  Return or see your primary care doctor if your headache and, or vomiting persist    ED Prescriptions    Medication Sig Dispense Auth. Provider   ondansetron (ZOFRAN-ODT) 8 MG disintegrating tablet Take 1 tablet (8 mg total) by mouth every 8 (eight) hours as needed for nausea. 12 tablet Elvina Sidle, MD   butalbital-acetaminophen-caffeine (FIORICET, ESGIC)  [phone] MG tablet Take 1-2 tablets by mouth every 6 (six) hours as needed for headache. 20 tablet Elvina Sidle, MD     Controlled Substance Prescriptions [place] Controlled Substance Registry consulted? Not Applicable   Elvina Sidle, MD 07/15/18 Emmaline Kluver, MD 07/15/18 [id number]

## 2018-07-16 ENCOUNTER — Telehealth (HOSPITAL_COMMUNITY): Payer: Self-pay | Admitting: Emergency Medicine

## 2018-07-27 ENCOUNTER — Other Ambulatory Visit: Payer: Self-pay | Admitting: Family Medicine

## 2018-07-28 NOTE — Telephone Encounter (Signed)
Last OV 05/08/18 Clonazepam last filled 05/08/18 #90 with 0

## 2018-08-12 ENCOUNTER — Other Ambulatory Visit: Payer: Self-pay | Admitting: Family Medicine

## 2018-08-12 DIAGNOSIS — Z1231 Encounter for screening mammogram for malignant neoplasm of breast: Secondary | ICD-10-CM

## 2018-08-17 ENCOUNTER — Other Ambulatory Visit: Payer: Self-pay | Admitting: Family Medicine

## 2018-08-29 ENCOUNTER — Other Ambulatory Visit: Payer: Self-pay | Admitting: Family Medicine

## 2018-08-29 ENCOUNTER — Ambulatory Visit (HOSPITAL_BASED_OUTPATIENT_CLINIC_OR_DEPARTMENT_OTHER)
Admission: RE | Admit: 2018-08-29 | Discharge: 2018-08-29 | Disposition: A | Discharge status: Home / Self Care | Payer: BLUE CROSS/BLUE SHIELD | Source: Ambulatory Visit | Attending: Family Medicine | Admitting: Family Medicine

## 2018-08-29 ENCOUNTER — Other Ambulatory Visit (HOSPITAL_BASED_OUTPATIENT_CLINIC_OR_DEPARTMENT_OTHER): Payer: Self-pay | Admitting: Family Medicine

## 2018-08-29 DIAGNOSIS — M810 Age-related osteoporosis without current pathological fracture: Secondary | ICD-10-CM | POA: Insufficient documentation

## 2018-08-29 DIAGNOSIS — Z1231 Encounter for screening mammogram for malignant neoplasm of breast: Secondary | ICD-10-CM | POA: Insufficient documentation

## 2018-08-29 DIAGNOSIS — M8589 Other specified disorders of bone density and structure, multiple sites: Secondary | ICD-10-CM | POA: Diagnosis not present

## 2018-09-29 ENCOUNTER — Telehealth: Payer: Self-pay | Admitting: General Practice

## 2018-09-29 NOTE — Telephone Encounter (Signed)
Called pt for two reasons. Her insurance coverage is not current so I could not get her prolia approved. Need an updated card.   Also pt is due a 6 month follow up in February. Needs to make appt.   Ok for [facility] to Discuss results / PCP recommendations / Schedule patient.

## 2018-10-08 ENCOUNTER — Encounter: Payer: Self-pay | Admitting: Family Medicine

## 2018-10-11 ENCOUNTER — Other Ambulatory Visit: Payer: Self-pay | Admitting: Family Medicine

## 2018-10-20 ENCOUNTER — Other Ambulatory Visit: Payer: Self-pay | Admitting: Emergency Medicine

## 2018-10-20 DIAGNOSIS — J301 Allergic rhinitis due to pollen: Secondary | ICD-10-CM

## 2018-10-20 MED ORDER — MONTELUKAST SODIUM 10 MG PO TABS: 10.0000 mg | ORAL_TABLET | Freq: Every day | ORAL | 1 refills | Status: DC

## 2018-10-25 ENCOUNTER — Other Ambulatory Visit: Payer: Self-pay | Admitting: Family Medicine

## 2018-10-29 ENCOUNTER — Other Ambulatory Visit: Payer: Self-pay | Admitting: Family Medicine

## 2018-10-29 NOTE — Telephone Encounter (Signed)
Last OV 05/08/18 (CPE) Clonazepam last filled 07/29/18 #90 with 0

## 2018-11-04 ENCOUNTER — Other Ambulatory Visit: Payer: Self-pay

## 2018-11-04 ENCOUNTER — Encounter: Payer: Self-pay | Admitting: Family Medicine

## 2018-11-04 ENCOUNTER — Ambulatory Visit: Payer: BLUE CROSS/BLUE SHIELD | Admitting: Family Medicine

## 2018-11-04 VITALS — BP 125/70 | HR 68 | Temp 98.0°F | Resp 16 | Ht 60.0 in | Wt 133.1 lb

## 2018-11-04 DIAGNOSIS — E663 Overweight: Secondary | ICD-10-CM | POA: Diagnosis not present

## 2018-11-04 DIAGNOSIS — E785 Hyperlipidemia, unspecified: Secondary | ICD-10-CM | POA: Diagnosis not present

## 2018-11-04 LAB — CBC WITH DIFFERENTIAL/PLATELET
Basophils Absolute: 0 10*3/uL (ref 0.0–0.1)
Basophils Relative: 0.7 % (ref 0.0–3.0)
Eosinophils Absolute: 0.1 10*3/uL (ref 0.0–0.7)
Eosinophils Relative: 2.6 % (ref 0.0–5.0)
HCT: 41.3 % (ref 36.0–46.0)
Hemoglobin: 13.7 g/dL (ref 12.0–15.0)
Lymphocytes Relative: 28.4 % (ref 12.0–46.0)
Lymphs Abs: 1.4 10*3/uL (ref 0.7–4.0)
MCHC: 33.1 g/dL (ref 30.0–36.0)
MCV: 90.6 fl (ref 78.0–100.0)
Monocytes Absolute: 0.4 10*3/uL (ref 0.1–1.0)
Monocytes Relative: 8 % (ref 3.0–12.0)
Neutro Abs: 3 10*3/uL (ref 1.4–7.7)
Neutrophils Relative %: 60.3 % (ref 43.0–77.0)
Platelets: 275 10*3/uL (ref 150.0–400.0)
RBC: 4.56 Mil/uL (ref 3.87–5.11)
RDW: 13.5 % (ref 11.5–15.5)
WBC: 4.9 10*3/uL (ref 4.0–10.5)

## 2018-11-04 LAB — LIPID PANEL
Cholesterol: 171 mg/dL (ref 0–200)
HDL: 60.8 mg/dL
LDL Cholesterol: 88 mg/dL (ref 0–99)
NonHDL: 109.72
Total CHOL/HDL Ratio: 3
Triglycerides: 109 mg/dL (ref 0.0–149.0)
VLDL: 21.8 mg/dL (ref 0.0–40.0)

## 2018-11-04 LAB — BASIC METABOLIC PANEL
BUN: 19 mg/dL (ref 6–23)
CO2: 27 mEq/L (ref 19–32)
Calcium: 9.5 mg/dL (ref 8.4–10.5)
Chloride: 104 mEq/L (ref 96–112)
Creatinine, Ser: 0.63 mg/dL (ref 0.40–1.20)
GFR: 95.72 mL/min (ref >60.00)
Glucose, Bld: 96 mg/dL (ref 70–99)
Potassium: 4.6 mEq/L (ref 3.5–5.1)
Sodium: 141 mEq/L (ref 135–145)

## 2018-11-04 LAB — HEPATIC FUNCTION PANEL
ALT: 21 U/L (ref 0–35)
AST: 20 U/L (ref 0–37)
Albumin: 4.4 g/dL (ref 3.5–5.2)
Alkaline Phosphatase: 51 U/L (ref 39–117)
Bilirubin, Direct: 0.1 mg/dL (ref 0.0–0.3)
Total Bilirubin: 0.5 mg/dL (ref 0.2–1.2)
Total Protein: 7.2 g/dL (ref 6.0–8.3)

## 2018-11-04 LAB — TSH: TSH: 0.85 u[IU]/mL (ref 0.35–4.50)

## 2018-11-04 MED ORDER — ALBUTEROL SULFATE HFA 108 (90 BASE) MCG/ACT IN AERS: 2.0000 | INHALATION_SPRAY | Freq: Four times a day (QID) | RESPIRATORY_TRACT | 1 refills | Status: DC | PRN

## 2018-11-04 NOTE — Assessment & Plan Note (Signed)
Chronic problem.  Tolerating statin w/o difficulty.  Check labs.  Adjust meds prn  

## 2018-11-04 NOTE — Patient Instructions (Signed)
Schedule your complete physical in 6 months We'll notify you of your lab results and make any changes if needed Continue to work on healthy diet and regular exercise- you can do it! Call with any questions or concerns Happy Spring!! 

## 2018-11-04 NOTE — Assessment & Plan Note (Signed)
Pt has gained 8 lbs since last visit.  Stressed need for healthy diet and regular exercise.  Check labs to risk stratify.

## 2018-11-04 NOTE — Progress Notes (Signed)
   Subjective:    Patient ID: Kaylee Pratt, female    DOB: 1955/09/19, 63 y.o.   MRN: [id number]  HPI Hyperlipidemia- chronic problem, on Simvastatin 20mg  daily.  No CP, SOB, HAs, visual changes, abd pain, N/V.  Overweight- pt has gained 8 lbs since last visit.  BMI now 26.  Continues to exercise- this is good stress relief.   Review of Systems For ROS see HPI     Objective:   Physical Exam Vitals signs reviewed.  Constitutional:      General: She is not in acute distress.    Appearance: She is well-developed.  HENT:     Head: Normocephalic and atraumatic.  Eyes:     Conjunctiva/sclera: Conjunctivae normal.     Pupils: Pupils are equal, round, and reactive to light.  Neck:     Musculoskeletal: Normal range of motion and neck supple.     Thyroid: No thyromegaly.  Cardiovascular:     Rate and Rhythm: Normal rate and regular rhythm.     Heart sounds: Normal heart sounds. No murmur.  Pulmonary:     Effort: Pulmonary effort is normal. No respiratory distress.     Breath sounds: Normal breath sounds.  Abdominal:     General: There is no distension.     Palpations: Abdomen is soft.     Tenderness: There is no abdominal tenderness.  Lymphadenopathy:     Cervical: No cervical adenopathy.  Skin:    General: Skin is warm and dry.  Neurological:     Mental Status: She is alert and oriented to person, place, and time.  Psychiatric:        Behavior: Behavior normal.           Assessment & Plan:

## 2018-11-05 ENCOUNTER — Encounter: Payer: Self-pay | Admitting: General Practice

## 2018-11-21 DIAGNOSIS — J301 Allergic rhinitis due to pollen: Secondary | ICD-10-CM | POA: Diagnosis not present

## 2018-11-21 DIAGNOSIS — J4520 Mild intermittent asthma, uncomplicated: Secondary | ICD-10-CM | POA: Diagnosis not present

## 2018-11-21 DIAGNOSIS — H1045 Other chronic allergic conjunctivitis: Secondary | ICD-10-CM | POA: Diagnosis not present

## 2018-11-21 DIAGNOSIS — J3089 Other allergic rhinitis: Secondary | ICD-10-CM | POA: Diagnosis not present

## 2018-11-26 DIAGNOSIS — J3081 Allergic rhinitis due to animal (cat) (dog) hair and dander: Secondary | ICD-10-CM | POA: Diagnosis not present

## 2018-11-26 DIAGNOSIS — J301 Allergic rhinitis due to pollen: Secondary | ICD-10-CM | POA: Diagnosis not present

## 2018-11-26 DIAGNOSIS — J3089 Other allergic rhinitis: Secondary | ICD-10-CM | POA: Diagnosis not present

## 2018-11-27 DIAGNOSIS — J3081 Allergic rhinitis due to animal (cat) (dog) hair and dander: Secondary | ICD-10-CM | POA: Diagnosis not present

## 2018-11-27 DIAGNOSIS — J3089 Other allergic rhinitis: Secondary | ICD-10-CM | POA: Diagnosis not present

## 2018-11-27 DIAGNOSIS — J301 Allergic rhinitis due to pollen: Secondary | ICD-10-CM | POA: Diagnosis not present

## 2018-12-02 ENCOUNTER — Telehealth: Payer: Self-pay | Admitting: General Practice

## 2018-12-02 NOTE — Telephone Encounter (Signed)
PA began by fax with [facility] today.

## 2018-12-17 ENCOUNTER — Other Ambulatory Visit: Payer: Self-pay | Admitting: Family Medicine

## 2018-12-17 NOTE — Telephone Encounter (Signed)
Last refill:10/29/18 #90, 0 Last OV:11/04/18

## 2019-02-25 ENCOUNTER — Other Ambulatory Visit: Payer: Self-pay | Admitting: Family Medicine

## 2019-02-25 DIAGNOSIS — J3089 Other allergic rhinitis: Secondary | ICD-10-CM | POA: Diagnosis not present

## 2019-02-25 DIAGNOSIS — J3081 Allergic rhinitis due to animal (cat) (dog) hair and dander: Secondary | ICD-10-CM | POA: Diagnosis not present

## 2019-02-25 DIAGNOSIS — J301 Allergic rhinitis due to pollen: Secondary | ICD-10-CM | POA: Diagnosis not present

## 2019-02-25 NOTE — Telephone Encounter (Signed)
Last OV 11/04/18 Clonazepam last filled 12/17/18 #90 with 0

## 2019-03-03 DIAGNOSIS — J3089 Other allergic rhinitis: Secondary | ICD-10-CM | POA: Diagnosis not present

## 2019-03-03 DIAGNOSIS — J3081 Allergic rhinitis due to animal (cat) (dog) hair and dander: Secondary | ICD-10-CM | POA: Diagnosis not present

## 2019-03-03 DIAGNOSIS — J301 Allergic rhinitis due to pollen: Secondary | ICD-10-CM | POA: Diagnosis not present

## 2019-03-05 DIAGNOSIS — J3081 Allergic rhinitis due to animal (cat) (dog) hair and dander: Secondary | ICD-10-CM | POA: Diagnosis not present

## 2019-03-05 DIAGNOSIS — J301 Allergic rhinitis due to pollen: Secondary | ICD-10-CM | POA: Diagnosis not present

## 2019-03-05 DIAGNOSIS — J3089 Other allergic rhinitis: Secondary | ICD-10-CM | POA: Diagnosis not present

## 2019-03-09 DIAGNOSIS — J3089 Other allergic rhinitis: Secondary | ICD-10-CM | POA: Diagnosis not present

## 2019-03-09 DIAGNOSIS — J3081 Allergic rhinitis due to animal (cat) (dog) hair and dander: Secondary | ICD-10-CM | POA: Diagnosis not present

## 2019-03-09 DIAGNOSIS — J301 Allergic rhinitis due to pollen: Secondary | ICD-10-CM | POA: Diagnosis not present

## 2019-03-11 DIAGNOSIS — J3089 Other allergic rhinitis: Secondary | ICD-10-CM | POA: Diagnosis not present

## 2019-03-11 DIAGNOSIS — J3081 Allergic rhinitis due to animal (cat) (dog) hair and dander: Secondary | ICD-10-CM | POA: Diagnosis not present

## 2019-03-11 DIAGNOSIS — J301 Allergic rhinitis due to pollen: Secondary | ICD-10-CM | POA: Diagnosis not present

## 2019-03-18 DIAGNOSIS — J3081 Allergic rhinitis due to animal (cat) (dog) hair and dander: Secondary | ICD-10-CM | POA: Diagnosis not present

## 2019-03-18 DIAGNOSIS — J3089 Other allergic rhinitis: Secondary | ICD-10-CM | POA: Diagnosis not present

## 2019-03-18 DIAGNOSIS — J301 Allergic rhinitis due to pollen: Secondary | ICD-10-CM | POA: Diagnosis not present

## 2019-03-26 DIAGNOSIS — J3089 Other allergic rhinitis: Secondary | ICD-10-CM | POA: Diagnosis not present

## 2019-03-26 DIAGNOSIS — J3081 Allergic rhinitis due to animal (cat) (dog) hair and dander: Secondary | ICD-10-CM | POA: Diagnosis not present

## 2019-03-26 DIAGNOSIS — J301 Allergic rhinitis due to pollen: Secondary | ICD-10-CM | POA: Diagnosis not present

## 2019-04-02 DIAGNOSIS — J301 Allergic rhinitis due to pollen: Secondary | ICD-10-CM | POA: Diagnosis not present

## 2019-04-02 DIAGNOSIS — J3089 Other allergic rhinitis: Secondary | ICD-10-CM | POA: Diagnosis not present

## 2019-04-02 DIAGNOSIS — J3081 Allergic rhinitis due to animal (cat) (dog) hair and dander: Secondary | ICD-10-CM | POA: Diagnosis not present

## 2019-04-08 DIAGNOSIS — J3081 Allergic rhinitis due to animal (cat) (dog) hair and dander: Secondary | ICD-10-CM | POA: Diagnosis not present

## 2019-04-08 DIAGNOSIS — J301 Allergic rhinitis due to pollen: Secondary | ICD-10-CM | POA: Diagnosis not present

## 2019-04-08 DIAGNOSIS — J3089 Other allergic rhinitis: Secondary | ICD-10-CM | POA: Diagnosis not present

## 2019-04-14 DIAGNOSIS — J3081 Allergic rhinitis due to animal (cat) (dog) hair and dander: Secondary | ICD-10-CM | POA: Diagnosis not present

## 2019-04-14 DIAGNOSIS — J3089 Other allergic rhinitis: Secondary | ICD-10-CM | POA: Diagnosis not present

## 2019-04-14 DIAGNOSIS — J301 Allergic rhinitis due to pollen: Secondary | ICD-10-CM | POA: Diagnosis not present

## 2019-04-17 ENCOUNTER — Other Ambulatory Visit: Payer: Self-pay | Admitting: Family Medicine

## 2019-04-17 NOTE — Telephone Encounter (Signed)
Last refill: 6.17.20 #90, 0 Last OV: 2.25.20 dx. HTN f/u

## 2019-04-21 DIAGNOSIS — J3089 Other allergic rhinitis: Secondary | ICD-10-CM | POA: Diagnosis not present

## 2019-04-21 DIAGNOSIS — J3081 Allergic rhinitis due to animal (cat) (dog) hair and dander: Secondary | ICD-10-CM | POA: Diagnosis not present

## 2019-04-21 DIAGNOSIS — J301 Allergic rhinitis due to pollen: Secondary | ICD-10-CM | POA: Diagnosis not present

## 2019-04-30 DIAGNOSIS — J3081 Allergic rhinitis due to animal (cat) (dog) hair and dander: Secondary | ICD-10-CM | POA: Diagnosis not present

## 2019-04-30 DIAGNOSIS — J3089 Other allergic rhinitis: Secondary | ICD-10-CM | POA: Diagnosis not present

## 2019-04-30 DIAGNOSIS — J301 Allergic rhinitis due to pollen: Secondary | ICD-10-CM | POA: Diagnosis not present

## 2019-05-07 DIAGNOSIS — J301 Allergic rhinitis due to pollen: Secondary | ICD-10-CM | POA: Diagnosis not present

## 2019-05-07 DIAGNOSIS — J3089 Other allergic rhinitis: Secondary | ICD-10-CM | POA: Diagnosis not present

## 2019-05-07 DIAGNOSIS — J3081 Allergic rhinitis due to animal (cat) (dog) hair and dander: Secondary | ICD-10-CM | POA: Diagnosis not present

## 2019-05-11 DIAGNOSIS — J301 Allergic rhinitis due to pollen: Secondary | ICD-10-CM | POA: Diagnosis not present

## 2019-05-11 DIAGNOSIS — J3081 Allergic rhinitis due to animal (cat) (dog) hair and dander: Secondary | ICD-10-CM | POA: Diagnosis not present

## 2019-05-11 DIAGNOSIS — J3089 Other allergic rhinitis: Secondary | ICD-10-CM | POA: Diagnosis not present

## 2019-05-12 ENCOUNTER — Ambulatory Visit (INDEPENDENT_AMBULATORY_CARE_PROVIDER_SITE_OTHER): Payer: BC Managed Care – PPO | Admitting: Physician Assistant

## 2019-05-12 ENCOUNTER — Encounter: Payer: BLUE CROSS/BLUE SHIELD | Admitting: Family Medicine

## 2019-05-12 ENCOUNTER — Encounter: Payer: Self-pay | Admitting: Physician Assistant

## 2019-05-12 ENCOUNTER — Other Ambulatory Visit: Payer: Self-pay

## 2019-05-12 VITALS — BP 110/78 | HR 64 | Temp 98.2°F | Resp 16 | Ht 60.0 in | Wt 137.0 lb

## 2019-05-12 DIAGNOSIS — Z23 Encounter for immunization: Secondary | ICD-10-CM

## 2019-05-12 DIAGNOSIS — M79671 Pain in right foot: Secondary | ICD-10-CM | POA: Diagnosis not present

## 2019-05-12 DIAGNOSIS — G8929 Other chronic pain: Secondary | ICD-10-CM | POA: Diagnosis not present

## 2019-05-12 DIAGNOSIS — Z1159 Encounter for screening for other viral diseases: Secondary | ICD-10-CM | POA: Diagnosis not present

## 2019-05-12 DIAGNOSIS — Z0001 Encounter for general adult medical examination with abnormal findings: Secondary | ICD-10-CM

## 2019-05-12 DIAGNOSIS — E785 Hyperlipidemia, unspecified: Secondary | ICD-10-CM | POA: Diagnosis not present

## 2019-05-12 DIAGNOSIS — Z0000 Encounter for general adult medical examination without abnormal findings: Secondary | ICD-10-CM | POA: Diagnosis not present

## 2019-05-12 LAB — CBC WITH DIFFERENTIAL/PLATELET
Basophils Absolute: 0 10*3/uL (ref 0.0–0.1)
Basophils Relative: 0.4 % (ref 0.0–3.0)
Eosinophils Absolute: 0.1 10*3/uL (ref 0.0–0.7)
Eosinophils Relative: 2.4 % (ref 0.0–5.0)
HCT: 41.4 % (ref 36.0–46.0)
Hemoglobin: 13.6 g/dL (ref 12.0–15.0)
Lymphocytes Relative: 40.9 % (ref 12.0–46.0)
Lymphs Abs: 1.8 10*3/uL (ref 0.7–4.0)
MCHC: 32.8 g/dL (ref 30.0–36.0)
MCV: 89.9 fl (ref 78.0–100.0)
Monocytes Absolute: 0.4 10*3/uL (ref 0.1–1.0)
Monocytes Relative: 8.8 % (ref 3.0–12.0)
Neutro Abs: 2.1 10*3/uL (ref 1.4–7.7)
Neutrophils Relative %: 47.5 % (ref 43.0–77.0)
Platelets: 266 10*3/uL (ref 150.0–400.0)
RBC: 4.61 Mil/uL (ref 3.87–5.11)
RDW: 13.3 % (ref 11.5–15.5)
WBC: 4.5 10*3/uL (ref 4.0–10.5)

## 2019-05-12 LAB — COMPREHENSIVE METABOLIC PANEL
ALT: 22 U/L (ref 0–35)
AST: 22 U/L (ref 0–37)
Albumin: 4.3 g/dL (ref 3.5–5.2)
Alkaline Phosphatase: 62 U/L (ref 39–117)
BUN: 13 mg/dL (ref 6–23)
CO2: 28 mEq/L (ref 19–32)
Calcium: 9.6 mg/dL (ref 8.4–10.5)
Chloride: 103 mEq/L (ref 96–112)
Creatinine, Ser: 0.66 mg/dL (ref 0.40–1.20)
GFR: 90.56 mL/min (ref >60.00)
Glucose, Bld: 89 mg/dL (ref 70–99)
Potassium: 4.5 mEq/L (ref 3.5–5.1)
Sodium: 140 mEq/L (ref 135–145)
Total Bilirubin: 0.6 mg/dL (ref 0.2–1.2)
Total Protein: 7.2 g/dL (ref 6.0–8.3)

## 2019-05-12 LAB — LIPID PANEL
Cholesterol: 193 mg/dL (ref 0–200)
HDL: 50.3 mg/dL (ref >39.00)
NonHDL: 142.54
Total CHOL/HDL Ratio: 4
Triglycerides: 261 mg/dL — ABNORMAL HIGH (ref 0.0–149.0)
VLDL: 52.2 mg/dL — ABNORMAL HIGH (ref 0.0–40.0)

## 2019-05-12 LAB — HEMOGLOBIN A1C: Hgb A1c MFr Bld: 5.4 % (ref 4.6–6.5)

## 2019-05-12 LAB — LDL CHOLESTEROL, DIRECT: Direct LDL: 102 mg/dL

## 2019-05-12 NOTE — Progress Notes (Signed)
Patient presents to clinic today for annual exam.  Patient is fasting for labs.  Diet -- Endorses well-balanced diet overall.  Exercise -- Exercises a few times per week. Is very active at work, walking throughout the day.   Patient is currently on a regimen of Lexapro 10 mg daily and Klonopin PRN for anxiety and depression. Doing well with current regimen. Denies breakthrough symptoms. Denies SI/HI.   Is currently on Simvastatin daily for hyperlipidemia. Is taking daily as directed. Diet and exercise as noted above.   Acute Concerns: Patient endorses pain and bump in posterior L heel x 1 month. Hurts with touching the area and sometimes with walking. Denies trauma/injury to the area. Denies swelling, bruising, redness.   Health Maintenance: Immunizations -- Agrees to flu shot today.  Colonoscopy -- UTD Mammogram -- Scheduled for repeat in December PAP -- UTD  Past Medical History:  Diagnosis Date   Allergy    Asthma    Depression    Duodenal diverticulum    GERD (gastroesophageal reflux disease)    GERD (gastroesophageal reflux disease)    Hiatal hernia    Hyperlipidemia    Osteoporosis     Past Surgical History:  Procedure Laterality Date   TUBAL LIGATION      Current Outpatient Medications on File Prior to Visit  Medication Sig Dispense Refill   albuterol (PROVENTIL HFA;VENTOLIN HFA) 108 (90 Base) MCG/ACT inhaler Inhale 2 puffs into the lungs every 6 (six) hours as needed for wheezing or shortness of breath. 3 Inhaler 1   Ascorbic Acid (VITAMIN C) 1000 MG tablet Take 1,000 mg by mouth daily.     aspirin EC 81 MG tablet Take 162 mg by mouth daily.      AUVI-Q 0.3 MG/0.3ML SOAJ injection Reported on 11/07/2015     BREO ELLIPTA 200-25 MCG/INH AEPB   2   butalbital-acetaminophen-caffeine (FIORICET, ESGIC) 50-325-40 MG tablet Take 1-2 tablets by mouth every 6 (six) hours as needed for headache. 20 tablet 0   cholecalciferol (VITAMIN D) 1000 UNITS tablet  Take 2,000 Units by mouth daily.     clonazePAM (KLONOPIN) 1 MG tablet TAKE 1/2 TO 1 TABLET BY MOUTH TWICE DAILY 90 tablet 0   escitalopram (LEXAPRO) 20 MG tablet TAKE 1 TABLET BY MOUTH EVERY DAY 90 tablet 1   hyoscyamine (LEVSIN SL) 0.125 MG SL tablet Place 1 tablet (0.125 mg total) under the tongue every 4 (four) hours as needed. 30 tablet 0   levocetirizine (XYZAL) 5 MG tablet   1   montelukast (SINGULAIR) 10 MG tablet Take 1 tablet (10 mg total) by mouth at bedtime. 90 tablet 1   omeprazole (PRILOSEC) 40 MG capsule TAKE 1 CAPSULE BY MOUTH EVERY DAY 90 capsule 1   simvastatin (ZOCOR) 20 MG tablet TAKE 1 TABLET BY MOUTH EVERYDAY AT BEDTIME 90 tablet 0   No current facility-administered medications on file prior to visit.     Allergies  Allergen Reactions   Amoxil [Amoxicillin] Rash   Iohexol Hives    Pt developed hives on her face and chest after IV contrast     Family History  Problem Relation Age of Onset   Prostate cancer Father    Other Neg Hx        no early CVA/CAD, no cancer   Sudden death Neg Hx    Diabetes Neg Hx    Heart attack Neg Hx    Hyperlipidemia Neg Hx    Hypertension Neg Hx  Social History   Socioeconomic History   Marital status: Married    Spouse name: Not on file   Number of children: 2   Years of education: Not on file   Highest education level: Not on file  Occupational History    Employer: [employer]  Social Needs   Financial resource strain: Not on file   Food insecurity    Worry: Not on file    Inability: Not on file   Transportation needs    Medical: Not on file    Non-medical: Not on file  Tobacco Use   Smoking status: Never Smoker   Smokeless tobacco: Never Used  Substance and Sexual Activity   Alcohol use: Yes    Alcohol/week: 2.0 standard drinks    Types: 2 Glasses of wine per week    Comment: wine on occasion   Drug use: No   Sexual activity: Not on file  Lifestyle   Physical activity      Days per week: Not on file    Minutes per session: Not on file   Stress: Not on file  Relationships   Social connections    Talks on phone: Not on file    Gets together: Not on file    Attends religious service: Not on file    Active member of club or organization: Not on file    Attends meetings of clubs or organizations: Not on file    Relationship status: Not on file   Intimate partner violence    Fear of current or ex partner: Not on file    Emotionally abused: Not on file    Physically abused: Not on file    Forced sexual activity: Not on file  Other Topics Concern   Not on file  Social History Narrative   Occupation: Sr Artist   Married   Will 23     [name] 19      Caffeine Use:  Rarely   Regular exercise:  4 x weekly            Review of Systems  Constitutional: Negative for fever and weight loss.  HENT: Negative for ear discharge, ear pain, hearing loss and tinnitus.   Eyes: Negative for blurred vision, double vision, photophobia and pain.  Respiratory: Negative for cough and shortness of breath.   Cardiovascular: Negative for chest pain and palpitations.  Gastrointestinal: Negative for abdominal pain, blood in stool, constipation, diarrhea, heartburn, melena, nausea and vomiting.  Genitourinary: Negative for dysuria, flank pain, frequency, hematuria and urgency.  Musculoskeletal: Negative for falls.  Neurological: Negative for dizziness, loss of consciousness and headaches.  Endo/Heme/Allergies: Negative for environmental allergies.  Psychiatric/Behavioral: Negative for depression, hallucinations, substance abuse and suicidal ideas. The patient is not nervous/anxious and does not have insomnia.    BP 110/78    Pulse 64    Temp 98.2 F (36.8 C) (Skin)    Resp 16    Ht 5' (1.524 m)    Wt 137 lb (62.1 kg)    SpO2 98%    BMI 26.76 kg/m   Physical Exam Vitals signs reviewed.  HENT:     Head: Normocephalic and atraumatic.     Right Ear: Tympanic  membrane, ear canal and external ear normal.     Left Ear: Tympanic membrane, ear canal and external ear normal.     Nose: Nose normal. No mucosal edema.     Mouth/Throat:     Pharynx: Uvula midline. No oropharyngeal exudate  or posterior oropharyngeal erythema.  Eyes:     Conjunctiva/sclera: Conjunctivae normal.     Pupils: Pupils are equal, round, and reactive to light.  Neck:     Musculoskeletal: Neck supple.     Thyroid: No thyromegaly.  Cardiovascular:     Rate and Rhythm: Normal rate and regular rhythm.     Heart sounds: Normal heart sounds.  Pulmonary:     Effort: Pulmonary effort is normal. No respiratory distress.     Breath sounds: Normal breath sounds. No wheezing or rales.  Abdominal:     General: Bowel sounds are normal. There is no distension.     Palpations: Abdomen is soft. There is no mass.     Tenderness: There is no abdominal tenderness. There is no guarding or rebound.  Lymphadenopathy:     Cervical: No cervical adenopathy.  Skin:    General: Skin is warm and dry.     Findings: No rash.  Neurological:     Mental Status: She is alert and oriented to person, place, and time.     Cranial Nerves: No cranial nerve deficit.    Assessment/Plan: 1. Visit for preventive health examination Depression screen negative. Health Maintenance reviewed -- Flu shot updated today. Preventive schedule discussed and handout given in AVS. Will obtain fasting labs today.  - CBC with Differential/Platelet - Comprehensive metabolic panel - Hemoglobin A1c - Lipid panel  2. Hyperlipidemia, unspecified hyperlipidemia type Taking statins as directed. Dietary and exercise recommendations reviewed with patient.  - Comprehensive metabolic panel - Hemoglobin A1c - Lipid panel  3. Need for hepatitis C screening test Hep C antibody obtained today. - Hepatitis C Antibody  4. Chronic heel pain, right - Ambulatory referral to [place], PA-C

## 2019-05-12 NOTE — Patient Instructions (Signed)
Please go to the lab for blood work.   Our office will call you with your results unless you have chosen to receive results via MyChart. If your blood work is normal we will follow-up each year for physicals and as scheduled for chronic medical problems. If anything is abnormal we will treat accordingly and get you in for a follow-up. You will be contacted for assessment by Sports Medicine.   Preventive Care 60-62 Years Old, Female Preventive care refers to visits with your health care provider and lifestyle choices that can promote health and wellness. This includes:  A yearly physical exam. This may also be called an annual well check.  Regular dental visits and eye exams.  Immunizations.  Screening for certain conditions.  Healthy lifestyle choices, such as eating a healthy diet, getting regular exercise, not using drugs or products that contain nicotine and tobacco, and limiting alcohol use. What can I expect for my preventive care visit? Physical exam Your health care provider will check your:  Height and weight. This may be used to calculate body mass index (BMI), which tells if you are at a healthy weight.  Heart rate and blood pressure.  Skin for abnormal spots. Counseling Your health care provider may ask you questions about your:  Alcohol, tobacco, and drug use.  Emotional well-being.  Home and relationship well-being.  Sexual activity.  Eating habits.  Work and work Statistician.  Method of birth control.  Menstrual cycle.  Pregnancy history. What immunizations do I need?  Influenza (flu) vaccine  This is recommended every year. Tetanus, diphtheria, and pertussis (Tdap) vaccine  You may need a Td booster every 10 years. Varicella (chickenpox) vaccine  You may need this if you have not been vaccinated. Zoster (shingles) vaccine  You may need this after age 69. Measles, mumps, and rubella (MMR) vaccine  You may need at least one dose of MMR if  you were born in 1957 or later. You may also need a second dose. Pneumococcal conjugate (PCV13) vaccine  You may need this if you have certain conditions and were not previously vaccinated. Pneumococcal polysaccharide (PPSV23) vaccine  You may need one or two doses if you smoke cigarettes or if you have certain conditions. Meningococcal conjugate (MenACWY) vaccine  You may need this if you have certain conditions. Hepatitis A vaccine  You may need this if you have certain conditions or if you travel or work in places where you may be exposed to hepatitis A. Hepatitis B vaccine  You may need this if you have certain conditions or if you travel or work in places where you may be exposed to hepatitis B. Haemophilus influenzae type b (Hib) vaccine  You may need this if you have certain conditions. Human papillomavirus (HPV) vaccine  If recommended by your health care provider, you may need three doses over 6 months. You may receive vaccines as individual doses or as more than one vaccine together in one shot (combination vaccines). Talk with your health care provider about the risks and benefits of combination vaccines. What tests do I need? Blood tests  Lipid and cholesterol levels. These may be checked every 5 years, or more frequently if you are over 61 years old.  Hepatitis C test.  Hepatitis B test. Screening  Lung cancer screening. You may have this screening every year starting at age 31 if you have a 30-pack-year history of smoking and currently smoke or have quit within the past 15 years.  Colorectal cancer screening.  All adults should have this screening starting at age 69 and continuing until age 41. Your health care provider may recommend screening at age 50 if you are at increased risk. You will have tests every 1-10 years, depending on your results and the type of screening test.  Diabetes screening. This is done by checking your blood sugar (glucose) after you have not  eaten for a while (fasting). You may have this done every 1-3 years.  Mammogram. This may be done every 1-2 years. Talk with your health care provider about when you should start having regular mammograms. This may depend on whether you have a family history of breast cancer.  BRCA-related cancer screening. This may be done if you have a family history of breast, ovarian, tubal, or peritoneal cancers.  Pelvic exam and Pap test. This may be done every 3 years starting at age 69. Starting at age 20, this may be done every 5 years if you have a Pap test in combination with an HPV test. Other tests  Sexually transmitted disease (STD) testing.  Bone density scan. This is done to screen for osteoporosis. You may have this scan if you are at high risk for osteoporosis. Follow these instructions at home: Eating and drinking  Eat a diet that includes fresh fruits and vegetables, whole grains, lean protein, and low-fat dairy.  Take vitamin and mineral supplements as recommended by your health care provider.  Do not drink alcohol if: ? Your health care provider tells you not to drink. ? You are pregnant, may be pregnant, or are planning to become pregnant.  If you drink alcohol: ? Limit how much you have to 0-1 drink a day. ? Be aware of how much alcohol is in your drink. In the [place] one drink equals one 12 oz bottle of beer (355 mL), one 5 oz glass of wine (148 mL), or one 1 oz glass of hard liquor (44 mL). Lifestyle  Take daily care of your teeth and gums.  Stay active. Exercise for at least 30 minutes on 5 or more days each week.  Do not use any products that contain nicotine or tobacco, such as cigarettes, e-cigarettes, and chewing tobacco. If you need help quitting, ask your health care provider.  If you are sexually active, practice safe sex. Use a condom or other form of birth control (contraception) in order to prevent pregnancy and STIs (sexually transmitted infections).  If told  by your health care provider, take low-dose aspirin daily starting at age 54. What's next?  Visit your health care provider once a year for a well check visit.  Ask your health care provider how often you should have your eyes and teeth checked.  Stay up to date on all vaccines. This information is not intended to replace advice given to you by your health care provider. Make sure you discuss any questions you have with your health care provider. Document Released: 09/23/2015 Document Revised: 05/08/2018 Document Reviewed: 05/08/2018 Elsevier Patient Education  2020 [employer]. .

## 2019-05-13 LAB — HEPATITIS C ANTIBODY
Hepatitis C Ab: NONREACTIVE
SIGNAL TO CUT-OFF: 0.03 (ref <1.00)

## 2019-05-14 ENCOUNTER — Other Ambulatory Visit: Payer: Self-pay

## 2019-05-14 ENCOUNTER — Ambulatory Visit: Payer: BC Managed Care – PPO | Admitting: Sports Medicine

## 2019-05-14 ENCOUNTER — Ambulatory Visit: Payer: Self-pay

## 2019-05-14 VITALS — BP 128/82 | Ht 61.0 in | Wt 137.0 lb

## 2019-05-14 DIAGNOSIS — S86002A Unspecified injury of left Achilles tendon, initial encounter: Secondary | ICD-10-CM

## 2019-05-14 DIAGNOSIS — M7752 Other enthesopathy of left foot: Secondary | ICD-10-CM | POA: Diagnosis not present

## 2019-05-14 DIAGNOSIS — M79672 Pain in left foot: Secondary | ICD-10-CM

## 2019-05-14 MED ORDER — PREDNISONE 10 MG PO TABS: ORAL_TABLET | ORAL | 0 refills | Status: DC

## 2019-05-14 NOTE — Assessment & Plan Note (Signed)
History and exam performed in office suspicious for retrocalcaneal bursitis.  Ultrasound was supportive for diagnosis. Patient still able to ambulate at this time. -Prescribed a body helix ankle compression device -Prescribed a 6-day steroid course -Recommended wearing high heels to relieve pressure on Achilles tendon -Return for follow-up exam in 2 weeks.-At that time can consider a joint aspiration and/or steroid injection if symptoms and exam have worsened.  Otherwise continuing with conservative management.

## 2019-05-14 NOTE — Progress Notes (Signed)
PCP: Midge Minium, MD  Subjective:   HPI: Patient is a 63 y.o. female here for left heel pain for about 3 to 4 weeks.  Patient states that she noticed about 3 or 4 weeks ago having soreness in her left heel.  She has never experienced any pain like this before.  In examining her heel she has noticed that the ankle is larger than her right and there seems to be a growth on her heel.  There is severe tenderness to palpation of the right heel and has limited the activities that patient is willing to do secondary to pain including walking.  She normally works out about 4 times per week with Pilates and yoga but has had been having difficulties with those activities.  She denies having any known injury or high impact to that area.  Did not have any penetrating injuries to the foot recently. patient states that she has noticed a clicking or popping sound when she walks on left foot but the sound is not associated with any pain.  Her PCP saw her today and noticed that one ankle was larger than the other. Patient denies history of diabetes, recent fever or blood clots. Patient states that she is up-to-date on all of her vaccines including flu shot earlier today. Patient had recent A1c showing 5.4 and recent lipid panel showing triglyceridemia of 261.  Review of Systems: See HPI above.     Objective:  BP 128/82   Ht 5\' 1"  (1.549 m)   Wt 137 lb (62.1 kg)   BMI 25.89 kg/m   Physical Exam: Gen: NAD, comfortable in exam room Left ankle-positive for nonpitting mild to moderate edema of left ankle joint in comparison to right ankle joint which has no swelling.  Bilateral ankles have full range of motion with plantar and dorsiflexion and pronation and supination.  Strength is intact bilaterally.  Left calcaneus shows a prominent area of swelling along the posterior lateral aspect of the bone.  It is slightly tender to palpation.  Slightly fluctuant.  [facility] ultrasound of the left calcaneus was  performed.  Images in long and short axis were obtained.  Patient has swelling in the retrocalcaneal bursa.  No evidence of Haglund's deformity.  Achilles tendon is unremarkable. Findings consistent with retrocalcaneal bursitis.   Assessment & Plan:  1.  Retrocalcaneal bursitis, left-acute  History and exam performed in office suspicious for retrocalcaneal bursitis.  Ultrasound was supportive for diagnosis. Patient still able to ambulate at this time. -Prescribed a body helix ankle compression device -Prescribed a 6-day steroid course -Recommended wearing high heels to relieve pressure on Achilles tendon -Return for follow-up exam in 2 weeks.-At that time can consider a joint aspiration and/or steroid injection if symptoms and exam have worsened.  Otherwise continuing with conservative management.  Patient seen and evaluated with the resident.  I agree with the above plan of care.  Ultrasound shows findings consistent with probable retrocalcaneal bursitis.  Treatment as above and follow-up in 2 weeks.

## 2019-05-14 NOTE — Patient Instructions (Addendum)
It was a pleasure to see you today!  To summarize our discussion for this visit:  By your story and examination it appears that you have retrocalcaneal bursitis which is swelling around the tendon in the back of your foot.  Today we are going to give you a compression stocking to help with the swelling on her ankle as well as a 6-day course of steroids to help with the swelling.  You may also want to wear high-heeled shoes which can help relieve some of the pressure on the tendon in the back of your foot.  Please return to our clinic to see me in about 2 weeks to follow-up on the progress of your swelling.  Call the clinic at [phone] if your symptoms worsen or you have any concerns.   Thank you for allowing me to take part in your care,  Dr. Doristine Mango

## 2019-05-15 ENCOUNTER — Encounter: Payer: Self-pay | Admitting: Sports Medicine

## 2019-05-19 ENCOUNTER — Telehealth: Payer: Self-pay | Admitting: General Practice

## 2019-05-19 NOTE — Telephone Encounter (Signed)
Called pt and LMOVM to verify if she left a health maintenance form with Einar Pheasant at her appt on 05/17/19

## 2019-05-20 DIAGNOSIS — J3081 Allergic rhinitis due to animal (cat) (dog) hair and dander: Secondary | ICD-10-CM | POA: Diagnosis not present

## 2019-05-20 DIAGNOSIS — J301 Allergic rhinitis due to pollen: Secondary | ICD-10-CM | POA: Diagnosis not present

## 2019-05-20 DIAGNOSIS — J3089 Other allergic rhinitis: Secondary | ICD-10-CM | POA: Diagnosis not present

## 2019-05-21 ENCOUNTER — Encounter: Payer: Self-pay | Admitting: Family Medicine

## 2019-05-22 NOTE — Telephone Encounter (Signed)
Patient notified us by my chart confirming her paperwork for work. Has been completed and advised patient to pick up at the front desk.

## 2019-05-27 DIAGNOSIS — J301 Allergic rhinitis due to pollen: Secondary | ICD-10-CM | POA: Diagnosis not present

## 2019-05-27 DIAGNOSIS — J3089 Other allergic rhinitis: Secondary | ICD-10-CM | POA: Diagnosis not present

## 2019-05-27 DIAGNOSIS — J3081 Allergic rhinitis due to animal (cat) (dog) hair and dander: Secondary | ICD-10-CM | POA: Diagnosis not present

## 2019-05-28 ENCOUNTER — Ambulatory Visit: Payer: BC Managed Care – PPO | Admitting: Sports Medicine

## 2019-05-28 ENCOUNTER — Other Ambulatory Visit: Payer: Self-pay

## 2019-05-28 VITALS — BP 108/70 | Ht 60.0 in | Wt 137.0 lb

## 2019-05-28 DIAGNOSIS — M7752 Other enthesopathy of left foot: Secondary | ICD-10-CM

## 2019-05-28 MED ORDER — DICLOFENAC SODIUM 75 MG PO TBEC: DELAYED_RELEASE_TABLET | ORAL | 0 refills | Status: DC

## 2019-05-28 NOTE — Progress Notes (Signed)
   Subjective:    Patient ID: Kaylee Pratt, female    DOB: [birth year], 63 y.o.   MRN: [id number]  HPI Patient returns to the clinic for follow-up on left heel retrocalcaneal bursitis.  Symptoms are about the same.  She is not sure whether or not the 6-day Sterapred Dosepak was helpful.  She does notice that wearing high heels feels the most comfortable.  Her job requires her to wear steel toed shoes and this seems to aggravate her condition.  She has been wearing her compression sleeve intermittently.  Review of Systems As above    Objective:   Physical Exam  Well-developed, well-nourished.  No acute distress.  Awake alert and oriented x3.  Vital signs reviewed  Left heel: There is still a palpable area of soft tissue swelling in the posterior calcaneus.  It is slightly tender to palpation.  Nonerythematous.  She does have pain with Achilles tendon stretching.  Negative calcaneal squeeze.  Neurovascularly intact distally.  Walking without a limp.  Previous [facility] ultrasound showed evidence consistent with probable retrocalcaneal bursitis      Assessment & Plan:   Persistent left heel pain and swelling likely secondary to retrocalcaneal bursitis  I recommend that the patient be more consistent with wearing her compression sleeve.  She really needs to wear it in her work shoes.  I have also given her a 5/16 inch lift for her work shoes.  Instructions on icing daily provided.  Voltaren 75 mg twice daily with food as needed.  Although her ultrasound shows evidence pretty convincing of retrocalcaneal bursitis her symptoms began acutely without any real inciting event.  She denies any increase in activity.  Therefore, if symptoms persist for another 2 to 3 weeks despite today's treatment then I would consider further diagnostic imaging to rule out other causes of soft tissue swelling in this area such as a soft tissue mass.  Follow-up as needed.

## 2019-05-28 NOTE — Patient Instructions (Signed)
Be sure to wear your compression sleeve on your foot at work.  I have given you some heel lifts to put in your work shoes as well.  I have given you a prescription for Voltaren to take twice a day if needed for pain and swelling in the heel.  Be sure to take this with food.  I would like for you to put ice on your heel 10 minutes, 2-3 times a day if possible.  Definitely put ice on it after work.  If you are still having problems with this in a couple of weeks call my office and I will order an MRI to make sure that it is not anything else other than bursitis.  Have fun on vacation!

## 2019-06-08 DIAGNOSIS — J3081 Allergic rhinitis due to animal (cat) (dog) hair and dander: Secondary | ICD-10-CM | POA: Diagnosis not present

## 2019-06-08 DIAGNOSIS — J301 Allergic rhinitis due to pollen: Secondary | ICD-10-CM | POA: Diagnosis not present

## 2019-06-08 DIAGNOSIS — J3089 Other allergic rhinitis: Secondary | ICD-10-CM | POA: Diagnosis not present

## 2019-06-10 ENCOUNTER — Other Ambulatory Visit: Payer: Self-pay | Admitting: Family Medicine

## 2019-06-10 NOTE — Telephone Encounter (Signed)
Last OV 05/12/19 Clonazepam last filled 04/19/01 #90 with 0

## 2019-06-12 DIAGNOSIS — J3081 Allergic rhinitis due to animal (cat) (dog) hair and dander: Secondary | ICD-10-CM | POA: Diagnosis not present

## 2019-06-12 DIAGNOSIS — J3089 Other allergic rhinitis: Secondary | ICD-10-CM | POA: Diagnosis not present

## 2019-06-12 DIAGNOSIS — J301 Allergic rhinitis due to pollen: Secondary | ICD-10-CM | POA: Diagnosis not present

## 2019-06-18 DIAGNOSIS — J3089 Other allergic rhinitis: Secondary | ICD-10-CM | POA: Diagnosis not present

## 2019-06-18 DIAGNOSIS — J301 Allergic rhinitis due to pollen: Secondary | ICD-10-CM | POA: Diagnosis not present

## 2019-06-18 DIAGNOSIS — J3081 Allergic rhinitis due to animal (cat) (dog) hair and dander: Secondary | ICD-10-CM | POA: Diagnosis not present

## 2019-06-24 DIAGNOSIS — J3081 Allergic rhinitis due to animal (cat) (dog) hair and dander: Secondary | ICD-10-CM | POA: Diagnosis not present

## 2019-06-24 DIAGNOSIS — J3089 Other allergic rhinitis: Secondary | ICD-10-CM | POA: Diagnosis not present

## 2019-06-24 DIAGNOSIS — J301 Allergic rhinitis due to pollen: Secondary | ICD-10-CM | POA: Diagnosis not present

## 2019-06-30 DIAGNOSIS — J3089 Other allergic rhinitis: Secondary | ICD-10-CM | POA: Diagnosis not present

## 2019-06-30 DIAGNOSIS — J301 Allergic rhinitis due to pollen: Secondary | ICD-10-CM | POA: Diagnosis not present

## 2019-06-30 DIAGNOSIS — J3081 Allergic rhinitis due to animal (cat) (dog) hair and dander: Secondary | ICD-10-CM | POA: Diagnosis not present

## 2019-07-08 DIAGNOSIS — J301 Allergic rhinitis due to pollen: Secondary | ICD-10-CM | POA: Diagnosis not present

## 2019-07-08 DIAGNOSIS — J3089 Other allergic rhinitis: Secondary | ICD-10-CM | POA: Diagnosis not present

## 2019-07-08 DIAGNOSIS — J3081 Allergic rhinitis due to animal (cat) (dog) hair and dander: Secondary | ICD-10-CM | POA: Diagnosis not present

## 2019-07-13 DIAGNOSIS — J301 Allergic rhinitis due to pollen: Secondary | ICD-10-CM | POA: Diagnosis not present

## 2019-07-13 DIAGNOSIS — J3089 Other allergic rhinitis: Secondary | ICD-10-CM | POA: Diagnosis not present

## 2019-07-13 DIAGNOSIS — J3081 Allergic rhinitis due to animal (cat) (dog) hair and dander: Secondary | ICD-10-CM | POA: Diagnosis not present

## 2019-07-21 DIAGNOSIS — J301 Allergic rhinitis due to pollen: Secondary | ICD-10-CM | POA: Diagnosis not present

## 2019-07-21 DIAGNOSIS — J3089 Other allergic rhinitis: Secondary | ICD-10-CM | POA: Diagnosis not present

## 2019-07-21 DIAGNOSIS — J3081 Allergic rhinitis due to animal (cat) (dog) hair and dander: Secondary | ICD-10-CM | POA: Diagnosis not present

## 2019-07-29 DIAGNOSIS — J3089 Other allergic rhinitis: Secondary | ICD-10-CM | POA: Diagnosis not present

## 2019-07-29 DIAGNOSIS — J301 Allergic rhinitis due to pollen: Secondary | ICD-10-CM | POA: Diagnosis not present

## 2019-07-29 DIAGNOSIS — J3081 Allergic rhinitis due to animal (cat) (dog) hair and dander: Secondary | ICD-10-CM | POA: Diagnosis not present

## 2019-08-04 DIAGNOSIS — J301 Allergic rhinitis due to pollen: Secondary | ICD-10-CM | POA: Diagnosis not present

## 2019-08-04 DIAGNOSIS — J3081 Allergic rhinitis due to animal (cat) (dog) hair and dander: Secondary | ICD-10-CM | POA: Diagnosis not present

## 2019-08-04 DIAGNOSIS — J3089 Other allergic rhinitis: Secondary | ICD-10-CM | POA: Diagnosis not present

## 2019-08-17 DIAGNOSIS — J3081 Allergic rhinitis due to animal (cat) (dog) hair and dander: Secondary | ICD-10-CM | POA: Diagnosis not present

## 2019-08-17 DIAGNOSIS — J3089 Other allergic rhinitis: Secondary | ICD-10-CM | POA: Diagnosis not present

## 2019-08-17 DIAGNOSIS — J301 Allergic rhinitis due to pollen: Secondary | ICD-10-CM | POA: Diagnosis not present

## 2019-08-26 DIAGNOSIS — J301 Allergic rhinitis due to pollen: Secondary | ICD-10-CM | POA: Diagnosis not present

## 2019-08-27 DIAGNOSIS — J3081 Allergic rhinitis due to animal (cat) (dog) hair and dander: Secondary | ICD-10-CM | POA: Diagnosis not present

## 2019-08-27 DIAGNOSIS — J301 Allergic rhinitis due to pollen: Secondary | ICD-10-CM | POA: Diagnosis not present

## 2019-08-27 DIAGNOSIS — J3089 Other allergic rhinitis: Secondary | ICD-10-CM | POA: Diagnosis not present

## 2019-08-31 ENCOUNTER — Other Ambulatory Visit: Payer: Self-pay

## 2019-08-31 ENCOUNTER — Other Ambulatory Visit: Payer: Self-pay | Admitting: Family Medicine

## 2019-08-31 ENCOUNTER — Ambulatory Visit (HOSPITAL_BASED_OUTPATIENT_CLINIC_OR_DEPARTMENT_OTHER)
Admission: RE | Admit: 2019-08-31 | Discharge: 2019-08-31 | Disposition: A | Discharge status: Home / Self Care | Payer: BC Managed Care – PPO | Source: Ambulatory Visit | Attending: Family Medicine | Admitting: Family Medicine

## 2019-08-31 DIAGNOSIS — Z1231 Encounter for screening mammogram for malignant neoplasm of breast: Secondary | ICD-10-CM

## 2019-09-09 ENCOUNTER — Other Ambulatory Visit: Payer: Self-pay | Admitting: Family Medicine

## 2019-09-09 NOTE — Telephone Encounter (Signed)
Last OV 05/12/19 Clonazepam last filled 06/12/19 #90 with 0

## 2019-10-18 ENCOUNTER — Other Ambulatory Visit: Payer: Self-pay | Admitting: Family Medicine

## 2019-10-29 DIAGNOSIS — J3089 Other allergic rhinitis: Secondary | ICD-10-CM | POA: Diagnosis not present

## 2019-10-29 DIAGNOSIS — J3081 Allergic rhinitis due to animal (cat) (dog) hair and dander: Secondary | ICD-10-CM | POA: Diagnosis not present

## 2019-10-29 DIAGNOSIS — J301 Allergic rhinitis due to pollen: Secondary | ICD-10-CM | POA: Diagnosis not present

## 2019-11-17 ENCOUNTER — Other Ambulatory Visit: Payer: Self-pay | Admitting: Family Medicine

## 2019-11-18 NOTE — Telephone Encounter (Signed)
Last OV 05/12/19 Clonazepam last filled 09/09/19 #90 with 0

## 2020-01-05 ENCOUNTER — Other Ambulatory Visit: Payer: Self-pay | Admitting: Family Medicine

## 2020-01-05 NOTE — Telephone Encounter (Signed)
Last refill: 11/18/19 #90, 0 Last OV: 05/12/2019 dx. CPE

## 2020-03-09 ENCOUNTER — Other Ambulatory Visit: Payer: Self-pay | Admitting: Family Medicine

## 2020-03-09 NOTE — Telephone Encounter (Signed)
Last OV 05/12/19 Clonazepam last filled 01/06/20 #90 with 0

## 2020-04-05 ENCOUNTER — Other Ambulatory Visit: Payer: Self-pay | Admitting: Family Medicine

## 2020-04-15 ENCOUNTER — Encounter: Payer: Self-pay | Admitting: Family Medicine

## 2020-04-20 ENCOUNTER — Encounter: Payer: Self-pay | Admitting: Family Medicine

## 2020-05-11 ENCOUNTER — Other Ambulatory Visit: Payer: Self-pay | Admitting: Family Medicine

## 2020-05-13 ENCOUNTER — Ambulatory Visit (INDEPENDENT_AMBULATORY_CARE_PROVIDER_SITE_OTHER): Payer: BC Managed Care – PPO | Admitting: Family Medicine

## 2020-05-13 ENCOUNTER — Encounter: Payer: Self-pay | Admitting: Family Medicine

## 2020-05-13 ENCOUNTER — Other Ambulatory Visit: Payer: Self-pay

## 2020-05-13 VITALS — BP 110/72 | HR 69 | Temp 98.4°F | Resp 16 | Ht 60.0 in | Wt 135.1 lb

## 2020-05-13 DIAGNOSIS — R222 Localized swelling, mass and lump, trunk: Secondary | ICD-10-CM

## 2020-05-13 DIAGNOSIS — E785 Hyperlipidemia, unspecified: Secondary | ICD-10-CM

## 2020-05-13 DIAGNOSIS — Z23 Encounter for immunization: Secondary | ICD-10-CM

## 2020-05-13 DIAGNOSIS — Z0000 Encounter for general adult medical examination without abnormal findings: Secondary | ICD-10-CM | POA: Diagnosis not present

## 2020-05-13 DIAGNOSIS — M810 Age-related osteoporosis without current pathological fracture: Secondary | ICD-10-CM

## 2020-05-13 LAB — HEPATIC FUNCTION PANEL
ALT: 28 U/L (ref 0–35)
AST: 26 U/L (ref 0–37)
Albumin: 4.3 g/dL (ref 3.5–5.2)
Alkaline Phosphatase: 68 U/L (ref 39–117)
Bilirubin, Direct: 0.1 mg/dL (ref 0.0–0.3)
Total Bilirubin: 0.5 mg/dL (ref 0.2–1.2)
Total Protein: 7.1 g/dL (ref 6.0–8.3)

## 2020-05-13 LAB — CBC WITH DIFFERENTIAL/PLATELET
Basophils Absolute: 0 10*3/uL (ref 0.0–0.1)
Basophils Relative: 0.4 % (ref 0.0–3.0)
Eosinophils Absolute: 0.2 10*3/uL (ref 0.0–0.7)
Eosinophils Relative: 5.2 % — ABNORMAL HIGH (ref 0.0–5.0)
HCT: 41.7 % (ref 36.0–46.0)
Hemoglobin: 13.7 g/dL (ref 12.0–15.0)
Lymphocytes Relative: 34.7 % (ref 12.0–46.0)
Lymphs Abs: 1.6 10*3/uL (ref 0.7–4.0)
MCHC: 32.7 g/dL (ref 30.0–36.0)
MCV: 91.6 fl (ref 78.0–100.0)
Monocytes Absolute: 0.4 10*3/uL (ref 0.1–1.0)
Monocytes Relative: 9.2 % (ref 3.0–12.0)
Neutro Abs: 2.3 10*3/uL (ref 1.4–7.7)
Neutrophils Relative %: 50.5 % (ref 43.0–77.0)
Platelets: 293 10*3/uL (ref 150.0–400.0)
RBC: 4.56 Mil/uL (ref 3.87–5.11)
RDW: 13.6 % (ref 11.5–15.5)
WBC: 4.6 10*3/uL (ref 4.0–10.5)

## 2020-05-13 LAB — TSH: TSH: 0.83 u[IU]/mL (ref 0.35–4.50)

## 2020-05-13 LAB — BASIC METABOLIC PANEL
BUN: 14 mg/dL (ref 6–23)
CO2: 27 mEq/L (ref 19–32)
Calcium: 9.4 mg/dL (ref 8.4–10.5)
Chloride: 105 mEq/L (ref 96–112)
Creatinine, Ser: 0.69 mg/dL (ref 0.40–1.20)
GFR: 85.76 mL/min (ref >60.00)
Glucose, Bld: 93 mg/dL (ref 70–99)
Potassium: 4.7 mEq/L (ref 3.5–5.1)
Sodium: 140 mEq/L (ref 135–145)

## 2020-05-13 LAB — LDL CHOLESTEROL, DIRECT: Direct LDL: 116 mg/dL

## 2020-05-13 LAB — LIPID PANEL
Cholesterol: 200 mg/dL (ref 0–200)
HDL: 53.6 mg/dL (ref >39.00)
NonHDL: 146.42
Total CHOL/HDL Ratio: 4
Triglycerides: 225 mg/dL — ABNORMAL HIGH (ref 0.0–149.0)
VLDL: 45 mg/dL — ABNORMAL HIGH (ref 0.0–40.0)

## 2020-05-13 LAB — VITAMIN D 25 HYDROXY (VIT D DEFICIENCY, FRACTURES): VITD: 70.74 ng/mL (ref 30.00–100.00)

## 2020-05-13 MED ORDER — CLONAZEPAM 1 MG PO TABS
ORAL_TABLET | ORAL | 1 refills | Status: DC
Start: 2020-05-13 — End: 2020-10-13

## 2020-05-13 MED ORDER — OMEPRAZOLE 40 MG PO CPDR
40.0000 mg | DELAYED_RELEASE_CAPSULE | Freq: Every day | ORAL | 1 refills | Status: DC
Start: 2020-05-13 — End: 2020-11-15

## 2020-05-13 MED ORDER — SIMVASTATIN 20 MG PO TABS
ORAL_TABLET | ORAL | 1 refills | Status: DC
Start: 2020-05-13 — End: 2021-01-19

## 2020-05-13 MED ORDER — ALBUTEROL SULFATE HFA 108 (90 BASE) MCG/ACT IN AERS: 2.0000 | INHALATION_SPRAY | Freq: Four times a day (QID) | RESPIRATORY_TRACT | 3 refills | Status: DC | PRN

## 2020-05-13 NOTE — Assessment & Plan Note (Signed)
UTD on DEXA.  Check Vit D level and replete prn. 

## 2020-05-13 NOTE — Progress Notes (Signed)
   Subjective:    Patient ID: Kaylee Pratt, female    DOB: [birth year], 64 y.o.   MRN: [id number]  HPI CPE- UTD on pap, mammo, colonoscopy.  UTD on COVID vaccine and Tdap.  Due for flu today.  Has moved out and left husband.  Father passed away.  Reviewed past medical, surgical, family and social histories.   Health Maintenance  Topic Date Due  . INFLUENZA VACCINE  04/10/2020  . HIV Screening  05/13/2021 (Originally 09/26/1970)  . MAMMOGRAM  08/30/2020  . PAP SMEAR-Modifier  05/08/2021  . COLONOSCOPY  04/16/2023  . TETANUS/TDAP  05/08/2028  . COVID-19 Vaccine  Completed  . Hepatitis C Screening  Completed      Review of Systems Patient reports no vision/ hearing changes, adenopathy,fever, weight change,  persistant/recurrent hoarseness , swallowing issues, chest pain, palpitations, edema, persistant/recurrent cough, hemoptysis, dyspnea (rest/exertional/paroxysmal nocturnal), gastrointestinal bleeding (melena, rectal bleeding), abdominal pain, significant heartburn, bowel changes, GU symptoms (dysuria, hematuria, incontinence), Gyn symptoms (abnormal  bleeding, pain),  syncope, focal weakness, memory loss, numbness & tingling, hair/nail changes, abnormal bruising or bleeding, anxiety, or depression.   Small skin/subcutaneous nodule of L chest wall- no TTP  This visit occurred during the SARS-CoV-2 public health emergency.  Safety protocols were in place, including screening questions prior to the visit, additional usage of staff PPE, and extensive cleaning of exam room while observing appropriate contact time as indicated for disinfecting solutions.       Objective:   Physical Exam General Appearance:    Alert, cooperative, no distress, appears stated age  Head:    Normocephalic, without obvious abnormality, atraumatic  Eyes:    PERRL, conjunctiva/corneas clear, EOM's intact, fundi    benign, both eyes  Ears:    Normal TM's and external ear canals, both ears  Nose:   Deferred due to  COVID  Throat:   Neck:   Supple, symmetrical, trachea midline, no adenopathy;    Thyroid: no enlargement/tenderness/nodules  Back:     Symmetric, no curvature, ROM normal, no CVA tenderness  Lungs:     Clear to auscultation bilaterally, respirations unlabored  Chest Wall:    No tenderness, small subcutaneous freely mobile cyst-like structure on L chest wall   Heart:    Regular rate and rhythm, S1 and S2 normal, no murmur, rub   or gallop  Breast Exam:    Deferred to mammo  Abdomen:     Soft, non-tender, bowel sounds active all four quadrants,    no masses, no organomegaly  Genitalia:    Deferred  Rectal:    Extremities:   Extremities normal, atraumatic, no cyanosis or edema  Pulses:   2+ and symmetric all extremities  Skin:   Skin color, texture, turgor normal, no rashes or lesions  Lymph nodes:   Cervical, supraclavicular, and axillary nodes normal  Neurologic:   CNII-XII intact, normal strength, sensation and reflexes    throughout          Assessment & Plan:

## 2020-05-13 NOTE — Assessment & Plan Note (Signed)
Pt's PE WNL w/ exception of small subcutaneous nodule on L chest wall (refer to Derm).  UTD on pap, mammo, colonoscopy, Tdap, COVID.  Flu shot given today.  Check labs.  Anticipatory guidance provided.

## 2020-05-13 NOTE — Assessment & Plan Note (Signed)
Chronic problem.  Tolerating statin w/o difficulty.  Check labs.  Adjust meds prn  

## 2020-05-13 NOTE — Patient Instructions (Signed)
Follow up in 6 months to recheck cholesterol We'll notify you of your lab results and make any changes if needed Keep up the good work on healthy diet and regular exercise- you can do it! We'll call you with your dermatology appt Call with any questions or concerns Stay Safe!  Stay Healthy!

## 2020-05-17 ENCOUNTER — Encounter: Payer: Self-pay | Admitting: General Practice

## 2020-06-03 ENCOUNTER — Encounter: Payer: Self-pay | Admitting: Family Medicine

## 2020-07-08 DIAGNOSIS — J4520 Mild intermittent asthma, uncomplicated: Secondary | ICD-10-CM | POA: Diagnosis not present

## 2020-07-08 DIAGNOSIS — H1045 Other chronic allergic conjunctivitis: Secondary | ICD-10-CM | POA: Diagnosis not present

## 2020-07-08 DIAGNOSIS — J3089 Other allergic rhinitis: Secondary | ICD-10-CM | POA: Diagnosis not present

## 2020-07-08 DIAGNOSIS — J301 Allergic rhinitis due to pollen: Secondary | ICD-10-CM | POA: Diagnosis not present

## 2020-07-11 ENCOUNTER — Emergency Department (HOSPITAL_COMMUNITY): Payer: BC Managed Care – PPO

## 2020-07-11 ENCOUNTER — Other Ambulatory Visit: Payer: Self-pay

## 2020-07-11 ENCOUNTER — Encounter (HOSPITAL_COMMUNITY): Payer: Self-pay | Admitting: *Deleted

## 2020-07-11 ENCOUNTER — Inpatient Hospital Stay (HOSPITAL_COMMUNITY)
Admission: EM | Admit: 2020-07-11 | Discharge: 2020-07-14 | DRG: 378 | Disposition: A | Discharge status: Home / Self Care | Payer: BC Managed Care – PPO | Attending: Internal Medicine | Admitting: Internal Medicine

## 2020-07-11 DIAGNOSIS — K922 Gastrointestinal hemorrhage, unspecified: Secondary | ICD-10-CM | POA: Diagnosis not present

## 2020-07-11 DIAGNOSIS — K8020 Calculus of gallbladder without cholecystitis without obstruction: Secondary | ICD-10-CM | POA: Diagnosis not present

## 2020-07-11 DIAGNOSIS — K5750 Diverticulosis of both small and large intestine without perforation or abscess without bleeding: Secondary | ICD-10-CM | POA: Diagnosis not present

## 2020-07-11 DIAGNOSIS — S022XXA Fracture of nasal bones, initial encounter for closed fracture: Secondary | ICD-10-CM | POA: Diagnosis present

## 2020-07-11 DIAGNOSIS — F32A Depression, unspecified: Secondary | ICD-10-CM | POA: Diagnosis not present

## 2020-07-11 DIAGNOSIS — Z20822 Contact with and (suspected) exposure to covid-19: Secondary | ICD-10-CM | POA: Diagnosis not present

## 2020-07-11 DIAGNOSIS — K449 Diaphragmatic hernia without obstruction or gangrene: Secondary | ICD-10-CM | POA: Diagnosis present

## 2020-07-11 DIAGNOSIS — K219 Gastro-esophageal reflux disease without esophagitis: Secondary | ICD-10-CM | POA: Diagnosis present

## 2020-07-11 DIAGNOSIS — Z91041 Radiographic dye allergy status: Secondary | ICD-10-CM | POA: Diagnosis not present

## 2020-07-11 DIAGNOSIS — S42021A Displaced fracture of shaft of right clavicle, initial encounter for closed fracture: Secondary | ICD-10-CM | POA: Diagnosis present

## 2020-07-11 DIAGNOSIS — Z880 Allergy status to penicillin: Secondary | ICD-10-CM

## 2020-07-11 DIAGNOSIS — Z7982 Long term (current) use of aspirin: Secondary | ICD-10-CM | POA: Diagnosis not present

## 2020-07-11 DIAGNOSIS — K5710 Diverticulosis of small intestine without perforation or abscess without bleeding: Secondary | ICD-10-CM | POA: Diagnosis not present

## 2020-07-11 DIAGNOSIS — Z043 Encounter for examination and observation following other accident: Secondary | ICD-10-CM | POA: Diagnosis not present

## 2020-07-11 DIAGNOSIS — K5731 Diverticulosis of large intestine without perforation or abscess with bleeding: Secondary | ICD-10-CM | POA: Diagnosis not present

## 2020-07-11 DIAGNOSIS — E876 Hypokalemia: Secondary | ICD-10-CM | POA: Diagnosis not present

## 2020-07-11 DIAGNOSIS — M810 Age-related osteoporosis without current pathological fracture: Secondary | ICD-10-CM | POA: Diagnosis not present

## 2020-07-11 DIAGNOSIS — K921 Melena: Secondary | ICD-10-CM | POA: Diagnosis present

## 2020-07-11 DIAGNOSIS — Z8042 Family history of malignant neoplasm of prostate: Secondary | ICD-10-CM

## 2020-07-11 DIAGNOSIS — E861 Hypovolemia: Secondary | ICD-10-CM | POA: Diagnosis not present

## 2020-07-11 DIAGNOSIS — E785 Hyperlipidemia, unspecified: Secondary | ICD-10-CM | POA: Diagnosis present

## 2020-07-11 DIAGNOSIS — K625 Hemorrhage of anus and rectum: Secondary | ICD-10-CM | POA: Diagnosis not present

## 2020-07-11 DIAGNOSIS — S0083XA Contusion of other part of head, initial encounter: Secondary | ICD-10-CM | POA: Diagnosis not present

## 2020-07-11 DIAGNOSIS — K648 Other hemorrhoids: Secondary | ICD-10-CM | POA: Diagnosis present

## 2020-07-11 DIAGNOSIS — J45909 Unspecified asthma, uncomplicated: Secondary | ICD-10-CM | POA: Diagnosis present

## 2020-07-11 DIAGNOSIS — K644 Residual hemorrhoidal skin tags: Secondary | ICD-10-CM | POA: Diagnosis present

## 2020-07-11 DIAGNOSIS — R55 Syncope and collapse: Secondary | ICD-10-CM

## 2020-07-11 DIAGNOSIS — S42001A Fracture of unspecified part of right clavicle, initial encounter for closed fracture: Secondary | ICD-10-CM | POA: Diagnosis not present

## 2020-07-11 DIAGNOSIS — D62 Acute posthemorrhagic anemia: Secondary | ICD-10-CM | POA: Diagnosis present

## 2020-07-11 DIAGNOSIS — K5791 Diverticulosis of intestine, part unspecified, without perforation or abscess with bleeding: Secondary | ICD-10-CM | POA: Diagnosis not present

## 2020-07-11 DIAGNOSIS — S299XXA Unspecified injury of thorax, initial encounter: Secondary | ICD-10-CM | POA: Diagnosis not present

## 2020-07-11 DIAGNOSIS — Z79899 Other long term (current) drug therapy: Secondary | ICD-10-CM | POA: Diagnosis not present

## 2020-07-11 DIAGNOSIS — S199XXA Unspecified injury of neck, initial encounter: Secondary | ICD-10-CM | POA: Diagnosis not present

## 2020-07-11 DIAGNOSIS — R1084 Generalized abdominal pain: Secondary | ICD-10-CM | POA: Diagnosis not present

## 2020-07-11 LAB — CBC
HCT: 36.3 % (ref 36.0–46.0)
HCT: 28.2 % — ABNORMAL LOW (ref 36.0–46.0)
Hemoglobin: 11.3 g/dL — ABNORMAL LOW (ref 12.0–15.0)
Hemoglobin: 9.1 g/dL — ABNORMAL LOW (ref 12.0–15.0)
MCH: 29.2 pg (ref 26.0–34.0)
MCH: 29.7 pg (ref 26.0–34.0)
MCHC: 31.1 g/dL (ref 30.0–36.0)
MCHC: 32.3 g/dL (ref 30.0–36.0)
MCV: 93.8 fL (ref 80.0–100.0)
MCV: 92.2 fL (ref 80.0–100.0)
Platelets: 304 10*3/uL (ref 150–400)
Platelets: 248 10*3/uL (ref 150–400)
RBC: 3.87 MIL/uL (ref 3.87–5.11)
RBC: 3.06 MIL/uL — ABNORMAL LOW (ref 3.87–5.11)
RDW: 12.4 % (ref 11.5–15.5)
RDW: 12.4 % (ref 11.5–15.5)
WBC: 12.6 10*3/uL — ABNORMAL HIGH (ref 4.0–10.5)
WBC: 10 10*3/uL (ref 4.0–10.5)
nRBC: 0 % (ref 0.0–0.2)
nRBC: 0 % (ref 0.0–0.2)

## 2020-07-11 LAB — DIFFERENTIAL
Abs Immature Granulocytes: 0.21 10*3/uL — ABNORMAL HIGH (ref 0.00–0.07)
Basophils Absolute: 0 10*3/uL (ref 0.0–0.1)
Basophils Relative: 0 %
Eosinophils Absolute: 0 10*3/uL (ref 0.0–0.5)
Eosinophils Relative: 0 %
Immature Granulocytes: 2 %
Lymphocytes Relative: 13 %
Lymphs Abs: 1.6 10*3/uL (ref 0.7–4.0)
Monocytes Absolute: 0.5 10*3/uL (ref 0.1–1.0)
Monocytes Relative: 4 %
Neutro Abs: 10.2 10*3/uL — ABNORMAL HIGH (ref 1.7–7.7)
Neutrophils Relative %: 81 %

## 2020-07-11 LAB — URINALYSIS, ROUTINE W REFLEX MICROSCOPIC
Bacteria, UA: NONE SEEN
Bilirubin Urine: NEGATIVE
Glucose, UA: NEGATIVE mg/dL
Ketones, ur: NEGATIVE mg/dL
Nitrite: NEGATIVE
Protein, ur: NEGATIVE mg/dL
Specific Gravity, Urine: 1.024 (ref 1.005–1.030)
pH: 6 (ref 5.0–8.0)

## 2020-07-11 LAB — RAPID URINE DRUG SCREEN, HOSP PERFORMED
Amphetamines: NOT DETECTED
Barbiturates: NOT DETECTED
Benzodiazepines: NOT DETECTED
Cocaine: NOT DETECTED
Opiates: NOT DETECTED
Tetrahydrocannabinol: NOT DETECTED

## 2020-07-11 LAB — COMPREHENSIVE METABOLIC PANEL
ALT: 32 U/L (ref 0–44)
AST: 30 U/L (ref 15–41)
Albumin: 3.9 g/dL (ref 3.5–5.0)
Alkaline Phosphatase: 58 U/L (ref 38–126)
Anion gap: 11 (ref 5–15)
BUN: 22 mg/dL (ref 8–23)
CO2: 21 mmol/L — ABNORMAL LOW (ref 22–32)
Calcium: 8.5 mg/dL — ABNORMAL LOW (ref 8.9–10.3)
Chloride: 104 mmol/L (ref 98–111)
Creatinine, Ser: 0.77 mg/dL (ref 0.44–1.00)
GFR, Estimated: >60 mL/min (ref >60)
Glucose, Bld: 177 mg/dL — ABNORMAL HIGH (ref 70–99)
Potassium: 4.5 mmol/L (ref 3.5–5.1)
Sodium: 136 mmol/L (ref 135–145)
Total Bilirubin: 0.9 mg/dL (ref 0.3–1.2)
Total Protein: 7.3 g/dL (ref 6.5–8.1)

## 2020-07-11 LAB — CBG MONITORING, ED: Glucose-Capillary: 165 mg/dL — ABNORMAL HIGH (ref 70–99)

## 2020-07-11 LAB — POC OCCULT BLOOD, ED: Fecal Occult Bld: POSITIVE — AB

## 2020-07-11 LAB — ETHANOL: Alcohol, Ethyl (B): <10 mg/dL (ref <10)

## 2020-07-11 LAB — TROPONIN I (HIGH SENSITIVITY)
Troponin I (High Sensitivity): <2 ng/L (ref <18)
Troponin I (High Sensitivity): 3 ng/L (ref <18)

## 2020-07-11 LAB — RESPIRATORY PANEL BY RT PCR (FLU A&B, COVID)
Influenza A by PCR: NEGATIVE
Influenza B by PCR: NEGATIVE
SARS Coronavirus 2 by RT PCR: NEGATIVE

## 2020-07-11 LAB — AMMONIA: Ammonia: 27 umol/L (ref 9–35)

## 2020-07-11 LAB — ABO/RH: ABO/RH(D): B POS

## 2020-07-11 LAB — CK: Total CK: 53 U/L (ref 38–234)

## 2020-07-11 MED ORDER — ALBUTEROL SULFATE HFA 108 (90 BASE) MCG/ACT IN AERS: 2.0000 | INHALATION_SPRAY | Freq: Four times a day (QID) | RESPIRATORY_TRACT | Status: DC | PRN

## 2020-07-11 MED ORDER — PANTOPRAZOLE SODIUM 40 MG IV SOLR
40.0000 mg | Freq: Two times a day (BID) | INTRAVENOUS | Status: DC
  Administered 2020-07-12: 40 mg via INTRAVENOUS
  Filled 2020-07-11: qty 40

## 2020-07-11 MED ORDER — ONDANSETRON HCL 4 MG/2ML IJ SOLN: 4.0000 mg | Freq: Four times a day (QID) | INTRAMUSCULAR | Status: DC | PRN

## 2020-07-11 MED ORDER — LACTATED RINGERS IV BOLUS
2000.0000 mL | Freq: Once | INTRAVENOUS | Status: AC
  Administered 2020-07-11: 2000 mL via INTRAVENOUS

## 2020-07-11 MED ORDER — MONTELUKAST SODIUM 10 MG PO TABS
10.0000 mg | ORAL_TABLET | Freq: Every day | ORAL | Status: DC
  Administered 2020-07-12 – 2020-07-13 (×3): 10 mg via ORAL
  Filled 2020-07-11 (×3): qty 1

## 2020-07-11 MED ORDER — MORPHINE SULFATE (PF) 4 MG/ML IV SOLN
4.0000 mg | Freq: Once | INTRAVENOUS | Status: AC
  Administered 2020-07-11: 4 mg via INTRAVENOUS
  Filled 2020-07-11: qty 1

## 2020-07-11 MED ORDER — MORPHINE SULFATE (PF) 2 MG/ML IV SOLN: 2.0000 mg | INTRAVENOUS | Status: DC | PRN

## 2020-07-11 MED ORDER — FENTANYL CITRATE (PF) 100 MCG/2ML IJ SOLN
50.0000 ug | Freq: Once | INTRAMUSCULAR | Status: AC
  Administered 2020-07-11: 50 ug via INTRAVENOUS
  Filled 2020-07-11: qty 2

## 2020-07-11 MED ORDER — ONDANSETRON HCL 4 MG PO TABS: 4.0000 mg | ORAL_TABLET | Freq: Four times a day (QID) | ORAL | Status: DC | PRN

## 2020-07-11 MED ORDER — ACETAMINOPHEN 650 MG RE SUPP: 650.0000 mg | Freq: Four times a day (QID) | RECTAL | Status: DC | PRN

## 2020-07-11 MED ORDER — ACETAMINOPHEN 325 MG PO TABS
650.0000 mg | ORAL_TABLET | Freq: Four times a day (QID) | ORAL | Status: DC | PRN
  Administered 2020-07-12 – 2020-07-14 (×2): 650 mg via ORAL
  Filled 2020-07-11 (×2): qty 2

## 2020-07-11 MED ORDER — ONDANSETRON HCL 4 MG/2ML IJ SOLN
4.0000 mg | Freq: Once | INTRAMUSCULAR | Status: AC
  Administered 2020-07-11: 4 mg via INTRAVENOUS
  Filled 2020-07-11: qty 2

## 2020-07-11 MED ORDER — ESCITALOPRAM OXALATE 10 MG PO TABS
10.0000 mg | ORAL_TABLET | Freq: Every day | ORAL | Status: DC
  Administered 2020-07-12 – 2020-07-13 (×2): 10 mg via ORAL
  Filled 2020-07-11 (×3): qty 1

## 2020-07-11 MED ORDER — CLONAZEPAM 1 MG PO TABS
1.0000 mg | ORAL_TABLET | Freq: Every day | ORAL | Status: DC
  Administered 2020-07-12 – 2020-07-13 (×2): 1 mg via ORAL
  Filled 2020-07-11 (×3): qty 1

## 2020-07-11 MED ORDER — SIMVASTATIN 10 MG PO TABS
10.0000 mg | ORAL_TABLET | ORAL | Status: DC
  Administered 2020-07-14: 10 mg via ORAL
  Filled 2020-07-11: qty 1

## 2020-07-11 MED ORDER — METHOCARBAMOL 1000 MG/10ML IJ SOLN
500.0000 mg | Freq: Four times a day (QID) | INTRAVENOUS | Status: DC | PRN
  Filled 2020-07-11: qty 5

## 2020-07-11 MED ORDER — SODIUM CHLORIDE 0.9 % IV SOLN: INTRAVENOUS | Status: DC

## 2020-07-11 NOTE — ED Notes (Signed)
Patient taken up by tech. 

## 2020-07-11 NOTE — ED Notes (Signed)
Pt  AO x 4

## 2020-07-11 NOTE — H&P (Signed)
TRH H&P    Patient Demographics:    Kaylee Pratt, is a 64 y.o. female  MRN: [id number]  DOB - [birth year]  Admit Date - 07/11/2020  Referring MD/NP/PA: Ander Slade  Outpatient Primary MD for the patient is Sheliah Hatch, MD  Patient coming from: Home  Chief complaint- Bright red blood per rectum and syncope and collapse   HPI:    Kaylee Pratt  is a 64 y.o. female, with history of osteoporosis, hyperlipidemia, hiatal hernia, GERD, depression, asthma, and more presents to the ED with a chief complaint of bright red blood per rectum.  Patient reports that this morning she had one bowel movement that filled the toilet with bright red blood.  She took half day off at work but then did go to work.  She had a 2nd large bloody bowel movement at lunchtime.  She called her children's father (as she puts it) and said that she probably has to go to the hospital.  She was sitting outside when she made this phone call, she got up to go back inside, and she had a syncope and collapse.  Patient reports that she felt "bad" and chilled, and said I think and go to pass out.  She does not remember if she had palpitations, chest pain, dyspnea prior to passing out.  She had only been up long enough to take a couple of steps plans happen.  When she came to she was oriented immediately.  She does report an associated stomach pain for 1 week prior to today.  She reports the pain felt like a cramping and tightness.  It was intermittent.  She had had stomach pain like this before and was told it was due to stress, so she was trying to alter her diet to just eat bland food.  Her last normal meal was this morning.  Her last normal bowel movement was the day before.  Patient does report she has had a colonoscopy in the past that showed internal hemorrhoids.  Patient syncope and collapse -she reports that she woke up laying on her right side.  She felt pain all  over her body but especially in her head and shoulder.  She told bystanders that she did not think she should get up and they should call EMS to move her.  In the ED Temperature 98.1, heart rate 73, respiratory rate 15, blood pressure 123/53, satting at 96% UA was borderline UDS negative  CT abdomen and chest - right clavicular fracture similar to plain film. Chronic fx of right 2nd, 3rd, and 4th ribs, single gall stone. Diverticulosis without diverticulitis. CT head = Minimally displaced right nasal bone fracture. Minimal right supraorbital soft tissue swelling. No skull fracture.  XR pelvis - unremarkable WBC 12.6, Chem panel unremarkable  HGB DOWN 2g from 11.3 to 9.1 Ammonia 27, CPK 53 EKG HR 72, SR 446  GI consulted from ED and reports that they will see the patient tonight. They reportedly said if she has another large bloody bowel movement to get a CTA of the  abdomen.      Review of systems:    In addition to the HPI above,  No Fever-chills, No Headache, No changes with Vision or hearing, No problems swallowing food or Liquids, No Chest pain, Cough or Shortness of Breath, Admits to abdominal pain, no nausea vomiting Admits to blood in stool, not in urine No dysuria, No new skin rashes or bruises, No new joints pains-aches,  No new weakness, tingling, numbness in any extremity, No recent weight gain or loss, No polyuria, polydypsia or polyphagia, No significant Mental Stressors.  All other systems reviewed and are negative.    Past History of the following :    Past Medical History:  Diagnosis Date  . Allergy   . Asthma   . Depression   . Duodenal diverticulum   . GERD (gastroesophageal reflux disease)   . GERD (gastroesophageal reflux disease)   . Hiatal hernia   . Hyperlipidemia   . Osteoporosis       Past Surgical History:  Procedure Laterality Date  . TUBAL LIGATION        Social History:      Social History   Tobacco Use  . Smoking status:  Never Smoker  . Smokeless tobacco: Never Used  Substance Use Topics  . Alcohol use: Yes    Alcohol/week: 2.0 standard drinks    Types: 2 Glasses of wine per week    Comment: wine on occasion       Family History :     Family History  Problem Relation Age of Onset  . Prostate cancer Father   . Other Neg Hx        no early CVA/CAD, no cancer  . Sudden death Neg Hx   . Diabetes Neg Hx   . Heart attack Neg Hx   . Hyperlipidemia Neg Hx   . Hypertension Neg Hx       Home Medications:   Prior to Admission medications   Medication Sig Start Date End Date Taking? Authorizing Provider  albuterol (VENTOLIN HFA) 108 (90 Base) MCG/ACT inhaler Inhale 2 puffs into the lungs every 6 (six) hours as needed for wheezing or shortness of breath. 05/13/20  Yes Sheliah Hatch, MD  aspirin EC 81 MG tablet Take 162 mg by mouth daily.    Yes [provider]  cholecalciferol (VITAMIN D) 1000 UNITS tablet Take 2,000 Units by mouth daily.   Yes [provider]  clonazePAM (KLONOPIN) 1 MG tablet 1 tab po BID prn anxiety Patient taking differently: Take 1 mg by mouth at bedtime.  05/13/20  Yes Sheliah Hatch, MD  escitalopram (LEXAPRO) 20 MG tablet TAKE 1 TABLET BY MOUTH EVERY DAY Patient taking differently: Take 10 mg by mouth at bedtime.  01/05/20  Yes Sheliah Hatch, MD  omeprazole (PRILOSEC) 40 MG capsule Take 1 capsule (40 mg total) by mouth daily. Patient taking differently: Take 40 mg by mouth daily as needed (indigestion).  05/13/20  Yes Sheliah Hatch, MD  simvastatin (ZOCOR) 20 MG tablet TAKE 1 TABLET BY MOUTH EVERYDAY AT BEDTIME Patient taking differently: Take 10 mg by mouth 2 (two) times a week.  05/13/20  Yes Sheliah Hatch, MD  montelukast (SINGULAIR) 10 MG tablet Take 1 tablet (10 mg total) by mouth at bedtime. Patient not taking: Reported on 07/11/2020 10/20/18   Sheliah Hatch, MD     Allergies:     Allergies  Allergen Reactions  . Amoxil  [Amoxicillin] Rash  . Iohexol  Hives    Pt developed hives on her face and chest after IV contrast      Physical Exam:   Vitals  Blood pressure (!) 111/58, pulse 90, temperature 98.1 F (36.7 C), temperature source Oral, resp. rate 18, height 5' (1.524 m), weight 58.5 kg, SpO2 94 %.  1.  General: Lying supine in bed in no acute distress  2. Psychiatric: Mood and behavior normal for situation  3. Neurologic: Cranial nerves II through XII are grossly intact, moves all 4 extremities voluntarily, equal sensation bilaterally in the upper and lower extremities, alert and oriented x3, no focal deficit on limited exam  4. HEENMT:  Head is normocephalic, bruising over right forehead, around right eye, minor abrasions, neck is supple, posterior neck is tender over paracervical musculature, trachea is midline  5. Respiratory : Lungs are clear to auscultation bilaterally  6. Cardiovascular : Heart rate is normal, rhythm is regular, no murmurs rubs or gallops  7. Gastrointestinal:  Abdomen is soft, nondistended, nontender to palpation  8. Skin:  Bruising over face and shoulder No other acute lesions on limited exam  9.Musculoskeletal:  No acute deformity of the lower extremities, deformity of clavicle with bruising right shoulder No peripheral edema Peripheral pulses palpated    Data Review:    CBC Recent Labs  Lab 07/11/20 1500 07/11/20 2115  WBC 12.6* 10.0  HGB 11.3* 9.1*  HCT 36.3 28.2*  PLT 304 248  MCV 93.8 92.2  MCH 29.2 29.7  MCHC 31.1 32.3  RDW 12.4 12.4  LYMPHSABS 1.6  --   MONOABS 0.5  --   EOSABS 0.0  --   BASOSABS 0.0  --    ------------------------------------------------------------------------------------------------------------------  Results for orders placed or performed during the hospital encounter of 07/11/20 (from the past 48 hour(s))  Comprehensive metabolic panel     Status: Abnormal   Collection Time: 07/11/20  3:00 PM  Result Value Ref  Range   Sodium 136 135 - 145 mmol/L   Potassium 4.5 3.5 - 5.1 mmol/L   Chloride 104 98 - 111 mmol/L   CO2 21 (L) 22 - 32 mmol/L   Glucose, Bld 177 (H) 70 - 99 mg/dL    Comment: Glucose reference range applies only to samples taken after fasting for at least 8 hours.   BUN 22 8 - 23 mg/dL   Creatinine, Ser 6.21 0.44 - 1.00 mg/dL   Calcium 8.5 (L) 8.9 - 10.3 mg/dL   Total Protein 7.3 6.5 - 8.1 g/dL   Albumin 3.9 3.5 - 5.0 g/dL   AST 30 15 - 41 U/L   ALT 32 0 - 44 U/L   Alkaline Phosphatase 58 38 - 126 U/L   Total Bilirubin 0.9 0.3 - 1.2 mg/dL   GFR, Estimated >30 >86 mL/min    Comment: (NOTE) Calculated using the CKD-EPI Creatinine Equation (2021)    Anion gap 11 5 - 15    Comment: Performed at [facility], [address], [place], [place] [address]  CBC     Status: Abnormal   Collection Time: 07/11/20  3:00 PM  Result Value Ref Range   WBC 12.6 (H) 4.0 - 10.5 K/uL   RBC 3.87 3.87 - 5.11 MIL/uL   Hemoglobin 11.3 (L) 12.0 - 15.0 g/dL   HCT 96.2 36 - 46 %   MCV 93.8 80.0 - 100.0 fL   MCH 29.2 26.0 - 34.0 pg   MCHC 31.1 30.0 - 36.0 g/dL   RDW 95.2 84.1 -  15.5 %   Platelets 304 150 - 400 K/uL   nRBC 0.0 0.0 - 0.2 %    Comment: Performed at [facility], [address], [place], [place] [address]  Differential     Status: Abnormal   Collection Time: 07/11/20  3:00 PM  Result Value Ref Range   Neutrophils Relative % 81 %   Neutro Abs 10.2 (H) 1.7 - 7.7 K/uL   Lymphocytes Relative 13 %   Lymphs Abs 1.6 0.7 - 4.0 K/uL   Monocytes Relative 4 %   Monocytes Absolute 0.5 0.1 - 1.0 K/uL   Eosinophils Relative 0 %   Eosinophils Absolute 0.0 0.0 - 0.5 K/uL   Basophils Relative 0 %   Basophils Absolute 0.0 0.0 - 0.1 K/uL   Immature Granulocytes 2 %   Abs Immature Granulocytes 0.21 (H) 0.00 - 0.07 K/uL    Comment: Performed at [facility], [address] [place], [place] [address]  ABO/Rh     Status: None   Collection Time:  07/11/20  3:00 PM  Result Value Ref Range   ABO/RH(D)      B POS Performed at [facility], [address] [place], [place] [address]   CBG monitoring, ED     Status: Abnormal   Collection Time: 07/11/20  3:05 PM  Result Value Ref Range   Glucose-Capillary 165 (H) 70 - 99 mg/dL    Comment: Glucose reference range applies only to samples taken after fasting for at least 8 hours.  Ethanol     Status: None   Collection Time: 07/11/20  4:06 PM  Result Value Ref Range   Alcohol, Ethyl (B) <10 <10 mg/dL    Comment: (NOTE) Lowest detectable limit for serum alcohol is 10 mg/dL.  For medical purposes only. Performed at [facility], [address], [place], [place] [address]   Troponin I (High Sensitivity)     Status: None   Collection Time: 07/11/20  4:06 PM  Result Value Ref Range   Troponin I (High Sensitivity) <2 <18 ng/L    Comment: (NOTE) Elevated high sensitivity troponin I (hsTnI) values and significant  changes across serial measurements may suggest ACS but many other  chronic and acute conditions are known to elevate hsTnI results.  Refer to the "Links" section for chest pain algorithms and additional  guidance. Performed at [facility], [address] [place], [place] [address]   Ammonia     Status: None   Collection Time: 07/11/20  4:06 PM  Result Value Ref Range   Ammonia 27 9 - 35 umol/L    Comment: Performed at [facility], [address] [place], [place] [address]  Type and screen [facility]     Status: None   Collection Time: 07/11/20  4:06 PM  Result Value Ref Range   ABO/RH(D) B POS    Antibody Screen POS    Sample Expiration 07/14/2020,2359    Antibody Identification      NO CLINICALLY SIGNIFICANT ANTIBODY IDENTIFIED Performed at [facility], [address] [place], [place] [address]   CK     Status: None   Collection Time: 07/11/20   4:06 PM  Result Value Ref Range   Total CK 53 38.0 - 234.0 U/L    Comment: Performed at [facility], [address] [place], [place] [address]  Respiratory Panel by RT PCR (Flu A&B, Covid) -  Nasopharyngeal Swab     Status: None   Collection Time: 07/11/20  4:06 PM   Specimen: Nasopharyngeal Swab  Result Value Ref Range   SARS Coronavirus 2 by RT PCR NEGATIVE NEGATIVE    Comment: (NOTE) SARS-CoV-2 target nucleic acids are NOT DETECTED.  The SARS-CoV-2 RNA is generally detectable in upper respiratoy specimens during the acute phase of infection. The lowest concentration of SARS-CoV-2 viral copies this assay can detect is 131 copies/mL. A negative result does not preclude SARS-Cov-2 infection and should not be used as the sole basis for treatment or other patient management decisions. A negative result may occur with  improper specimen collection/handling, submission of specimen other than nasopharyngeal swab, presence of viral mutation(s) within the areas targeted by this assay, and inadequate number of viral copies (<131 copies/mL). A negative result must be combined with clinical observations, patient history, and epidemiological information. The expected result is Negative.  Fact Sheet for Patients:  https://www.moore.com/  Fact Sheet for Healthcare Providers:  https://www.young.biz/  This test is no t yet approved or cleared by the [place] FDA and  has been authorized for detection and/or diagnosis of SARS-CoV-2 by FDA under an Emergency Use Authorization (EUA). This EUA will remain  in effect (meaning this test can be used) for the duration of the COVID-19 declaration under Section 564(b)(1) of the Act, 21 U.S.C. section 360bbb-3(b)(1), unless the authorization is terminated or revoked sooner.     Influenza A by PCR NEGATIVE NEGATIVE   Influenza B by PCR NEGATIVE NEGATIVE    Comment: (NOTE) The Xpert Xpress  SARS-CoV-2/FLU/RSV assay is intended as an aid in  the diagnosis of influenza from Nasopharyngeal swab specimens and  should not be used as a sole basis for treatment. Nasal washings and  aspirates are unacceptable for Xpert Xpress SARS-CoV-2/FLU/RSV  testing.  Fact Sheet for Patients: https://www.moore.com/  Fact Sheet for Healthcare Providers: https://www.young.biz/  This test is not yet approved or cleared by the [place] FDA and  has been authorized for detection and/or diagnosis of SARS-CoV-2 by  FDA under an Emergency Use Authorization (EUA). This EUA will remain  in effect (meaning this test can be used) for the duration of the  Covid-19 declaration under Section 564(b)(1) of the Act, 21  U.S.C. section 360bbb-3(b)(1), unless the authorization is  terminated or revoked. Performed at [facility], [address], [place], [place] [address]   POC occult blood, ED     Status: Abnormal   Collection Time: 07/11/20  4:09 PM  Result Value Ref Range   Fecal Occult Bld POSITIVE (A) NEGATIVE  Troponin I (High Sensitivity)     Status: None   Collection Time: 07/11/20  5:30 PM  Result Value Ref Range   Troponin I (High Sensitivity) 3 <18 ng/L    Comment: (NOTE) Elevated high sensitivity troponin I (hsTnI) values and significant  changes across serial measurements may suggest ACS but many other  chronic and acute conditions are known to elevate hsTnI results.  Refer to the "Links" section for chest pain algorithms and additional  guidance. Performed at [facility], [address] [place], [place] [address]   Urinalysis, Routine w reflex microscopic Urine, Clean Catch     Status: Abnormal   Collection Time: 07/11/20  5:48 PM  Result Value Ref Range   Color, Urine YELLOW YELLOW   APPearance CLEAR CLEAR   Specific Gravity, Urine 1.024 1.005 - 1.030   pH 6.0 5.0 - 8.0  Glucose, UA NEGATIVE NEGATIVE  mg/dL   Hgb urine dipstick MODERATE (A) NEGATIVE   Bilirubin Urine NEGATIVE NEGATIVE   Ketones, ur NEGATIVE NEGATIVE mg/dL   Protein, ur NEGATIVE NEGATIVE mg/dL   Nitrite NEGATIVE NEGATIVE   Leukocytes,Ua TRACE (A) NEGATIVE   RBC / HPF 0-5 0 - 5 RBC/hpf   WBC, UA 6-10 0 - 5 WBC/hpf   Bacteria, UA NONE SEEN NONE SEEN   Squamous Epithelial / LPF 0-5 0 - 5    Comment: Performed at [facility], [address] [place], [place] [address]  Urine rapid drug screen (hosp performed)     Status: None   Collection Time: 07/11/20  5:48 PM  Result Value Ref Range   Opiates NONE DETECTED NONE DETECTED   Cocaine NONE DETECTED NONE DETECTED   Benzodiazepines NONE DETECTED NONE DETECTED   Amphetamines NONE DETECTED NONE DETECTED   Tetrahydrocannabinol NONE DETECTED NONE DETECTED   Barbiturates NONE DETECTED NONE DETECTED    Comment: (NOTE) DRUG SCREEN FOR MEDICAL PURPOSES ONLY.  IF CONFIRMATION IS NEEDED FOR ANY PURPOSE, NOTIFY LAB WITHIN 5 DAYS.  LOWEST DETECTABLE LIMITS FOR URINE DRUG SCREEN Drug Class                     Cutoff (ng/mL) Amphetamine and metabolites    1000 Barbiturate and metabolites    200 Benzodiazepine                 200 Tricyclics and metabolites     300 Opiates and metabolites        300 Cocaine and metabolites        300 THC                            50 Performed at [facility], [address], [place], [place] [address]   CBC     Status: Abnormal   Collection Time: 07/11/20  9:15 PM  Result Value Ref Range   WBC 10.0 4.0 - 10.5 K/uL   RBC 3.06 (L) 3.87 - 5.11 MIL/uL   Hemoglobin 9.1 (L) 12.0 - 15.0 g/dL   HCT 01.0 (L) 36 - 46 %   MCV 92.2 80.0 - 100.0 fL   MCH 29.7 26.0 - 34.0 pg   MCHC 32.3 30.0 - 36.0 g/dL   RDW 27.2 53.6 - 64.4 %   Platelets 248 150 - 400 K/uL   nRBC 0.0 0.0 - 0.2 %    Comment: Performed at [facility], [address], [place], [place] [address]    Chemistries  Recent  Labs  Lab 07/11/20 1500  NA 136  K 4.5  CL 104  CO2 21*  GLUCOSE 177*  BUN 22  CREATININE 0.77  CALCIUM 8.5*  AST 30  ALT 32  ALKPHOS 58  BILITOT 0.9   ------------------------------------------------------------------------------------------------------------------  ------------------------------------------------------------------------------------------------------------------ GFR: Estimated Creatinine Clearance: 57.6 mL/min (by C-G formula based on SCr of 0.77 mg/dL). Liver Function Tests: Recent Labs  Lab 07/11/20 1500  AST 30  ALT 32  ALKPHOS 58  BILITOT 0.9  PROT 7.3  ALBUMIN 3.9   No results for input(s): LIPASE, AMYLASE in the last 168 hours. Recent Labs  Lab 07/11/20 1606  AMMONIA 27   Coagulation Profile: No results for input(s): INR, PROTIME in the last 168 hours. Cardiac Enzymes: Recent Labs  Lab 07/11/20 1606  CKTOTAL 53   BNP (last 3 results) No results  for input(s): PROBNP in the last 8760 hours. HbA1C: No results for input(s): HGBA1C in the last 72 hours. CBG: Recent Labs  Lab 07/11/20 1505  GLUCAP 165*   Lipid Profile: No results for input(s): CHOL, HDL, LDLCALC, TRIG, CHOLHDL, LDLDIRECT in the last 72 hours. Thyroid Function Tests: No results for input(s): TSH, T4TOTAL, FREET4, T3FREE, THYROIDAB in the last 72 hours. Anemia Panel: No results for input(s): VITAMINB12, FOLATE, FERRITIN, TIBC, IRON, RETICCTPCT in the last 72 hours.  --------------------------------------------------------------------------------------------------------------- Urine analysis:    Component Value Date/Time   COLORURINE YELLOW 07/11/2020 1748   APPEARANCEUR CLEAR 07/11/2020 1748   LABSPEC 1.024 07/11/2020 1748   PHURINE 6.0 07/11/2020 1748   GLUCOSEU NEGATIVE 07/11/2020 1748   GLUCOSEU NEGATIVE 11/15/2017 1027   HGBUR MODERATE (A) 07/11/2020 1748   BILIRUBINUR NEGATIVE 07/11/2020 1748   KETONESUR NEGATIVE 07/11/2020 1748   PROTEINUR NEGATIVE  07/11/2020 1748   UROBILINOGEN 0.2 11/15/2017 1027   NITRITE NEGATIVE 07/11/2020 1748   LEUKOCYTESUR TRACE (A) 07/11/2020 1748      Imaging Results:    CT ABDOMEN PELVIS WO CONTRAST  Result Date: 07/11/2020 CLINICAL DATA:  Generalized abdominal pain following fall, initial encounter EXAM: CT CHEST, ABDOMEN AND PELVIS WITHOUT CONTRAST TECHNIQUE: Multidetector CT imaging of the chest, abdomen and pelvis was performed following the standard protocol without IV contrast. COMPARISON:  Chest x-ray from earlier in the same day. FINDINGS: CT CHEST FINDINGS Cardiovascular: Somewhat limited due to lack of IV contrast. Atherosclerotic calcifications are noted. No cardiac enlargement is seen. Mild coronary calcifications are noted. Mediastinum/Nodes: Thoracic inlet is within normal limits. No sizable hilar or mediastinal adenopathy is noted. The esophagus as visualized is within normal limits. Lungs/Pleura: The lungs are well aerated bilaterally. Minimal bibasilar atelectatic changes are seen. No focal confluent infiltrate is noted. No sizable effusion is seen. No parenchymal nodule is noted. Musculoskeletal: Comminuted right clavicular fracture is noted similar to that seen on prior plain film examination. Angulated fractures of the right second, third and fourth ribs anteriorly are seen. No displacement is noted and these are likely chronic in nature. No compression deformities are seen. No sternal fracture is noted. CT ABDOMEN PELVIS FINDINGS Hepatobiliary: Liver is well visualized within normal limits. Gallbladder is well distended. Tiny dependent gallstone is noted. Pancreas: Unremarkable. No pancreatic ductal dilatation or surrounding inflammatory changes. Spleen: Normal in size without focal abnormality. Adrenals/Urinary Tract: Adrenal glands are within normal limits. Kidneys show no renal calculi or obstructive changes. Ureters are within normal limits to the level of the urinary bladder. Bladder is  predominately decompressed. Stomach/Bowel: Colon is predominately decompressed. Mild diverticular changes noted without evidence of diverticulitis. The appendix is unremarkable. Small bowel and stomach are within normal limits with the exception of a large duodenal diverticulum adjacent to the head of the pancreas. Vascular/Lymphatic: No significant vascular findings are present. No enlarged abdominal or pelvic lymph nodes. Reproductive: Uterus and bilateral adnexa are unremarkable. Other: No abdominal wall hernia or abnormality. No abdominopelvic ascites. Musculoskeletal: No acute or significant osseous findings. IMPRESSION: Comminuted right clavicular fracture similar to that seen on prior plain film examination. Chronic appearing fractures of the right second, third and fourth ribs as described. Single gallstone. Diverticulosis without diverticulitis. Electronically Signed   By: Alcide Clever M.D.   On: 07/11/2020 17:19   DG Clavicle Right  Result Date: 07/11/2020 CLINICAL DATA:  Larey Seat, deformity EXAM: RIGHT CLAVICLE - 2+ VIEWS COMPARISON:  07/11/2020, 07/22/2017 FINDINGS: Frontal and lordotic views of the right clavicle demonstrate a comminuted displaced  mid right clavicular fracture with 1 shaft width inferior displacement of the lateral fracture fragment. Acromioclavicular and glenohumeral joints are well aligned. The right chest is clear. IMPRESSION: 1. Comminuted displaced mid right clavicular fracture. Electronically Signed   By: Sharlet Salina M.D.   On: 07/11/2020 16:18   CT Head Wo Contrast  Result Date: 07/11/2020 CLINICAL DATA:  Larey Seat, hit head, nausea and emesis EXAM: CT HEAD WITHOUT CONTRAST CT MAXILLOFACIAL WITHOUT CONTRAST CT CERVICAL SPINE WITHOUT CONTRAST TECHNIQUE: Multidetector CT imaging of the head, cervical spine, and maxillofacial structures were performed using the standard protocol without intravenous contrast. Multiplanar CT image reconstructions of the cervical spine and  maxillofacial structures were also generated. COMPARISON:  07/11/2015 FINDINGS: CT HEAD FINDINGS Brain: No acute infarct or hemorrhage. Lateral ventricles and midline structures are stable, with continued benign basal ganglia calcifications noted. No acute extra-axial fluid collections. No mass effect. Vascular: No hyperdense vessel or unexpected calcification. Skull: Normal. Negative for fracture or focal lesion. Other: None. CT MAXILLOFACIAL FINDINGS Osseous: There is a minimally displaced fracture through the right inferior aspect of the nasal bone. No other acute bony abnormalities. Orbits: Negative. No traumatic or inflammatory finding. Sinuses: Clear. Soft tissues: Minimal right supraorbital soft tissue edema. CT CERVICAL SPINE FINDINGS Alignment: Alignment is anatomic. Skull base and vertebrae: No acute fracture. No primary bone lesion or focal pathologic process. Soft tissues and spinal canal: No prevertebral fluid or swelling. No visible canal hematoma. Disc levels: Mild spondylosis at C4-5 and C5-6. Mild diffuse facet hypertrophy. Left predominant neural foraminal narrowing at C5-6. Upper chest: Airway is patent.  Lung apices are clear. Other: Reconstructed images demonstrate no additional findings. IMPRESSION: 1. No acute intracranial process. 2. Minimally displaced right nasal bone fracture. 3. Minimal right supraorbital soft tissue swelling. No underlying skull fracture. 4. Mild cervical spondylosis.  No acute cervical spine fracture. Electronically Signed   By: Sharlet Salina M.D.   On: 07/11/2020 17:16   CT Chest Wo Contrast  Result Date: 07/11/2020 CLINICAL DATA:  Generalized abdominal pain following fall, initial encounter EXAM: CT CHEST, ABDOMEN AND PELVIS WITHOUT CONTRAST TECHNIQUE: Multidetector CT imaging of the chest, abdomen and pelvis was performed following the standard protocol without IV contrast. COMPARISON:  Chest x-ray from earlier in the same day. FINDINGS: CT CHEST FINDINGS  Cardiovascular: Somewhat limited due to lack of IV contrast. Atherosclerotic calcifications are noted. No cardiac enlargement is seen. Mild coronary calcifications are noted. Mediastinum/Nodes: Thoracic inlet is within normal limits. No sizable hilar or mediastinal adenopathy is noted. The esophagus as visualized is within normal limits. Lungs/Pleura: The lungs are well aerated bilaterally. Minimal bibasilar atelectatic changes are seen. No focal confluent infiltrate is noted. No sizable effusion is seen. No parenchymal nodule is noted. Musculoskeletal: Comminuted right clavicular fracture is noted similar to that seen on prior plain film examination. Angulated fractures of the right second, third and fourth ribs anteriorly are seen. No displacement is noted and these are likely chronic in nature. No compression deformities are seen. No sternal fracture is noted. CT ABDOMEN PELVIS FINDINGS Hepatobiliary: Liver is well visualized within normal limits. Gallbladder is well distended. Tiny dependent gallstone is noted. Pancreas: Unremarkable. No pancreatic ductal dilatation or surrounding inflammatory changes. Spleen: Normal in size without focal abnormality. Adrenals/Urinary Tract: Adrenal glands are within normal limits. Kidneys show no renal calculi or obstructive changes. Ureters are within normal limits to the level of the urinary bladder. Bladder is predominately decompressed. Stomach/Bowel: Colon is predominately decompressed. Mild diverticular changes noted without evidence  of diverticulitis. The appendix is unremarkable. Small bowel and stomach are within normal limits with the exception of a large duodenal diverticulum adjacent to the head of the pancreas. Vascular/Lymphatic: No significant vascular findings are present. No enlarged abdominal or pelvic lymph nodes. Reproductive: Uterus and bilateral adnexa are unremarkable. Other: No abdominal wall hernia or abnormality. No abdominopelvic ascites.  Musculoskeletal: No acute or significant osseous findings. IMPRESSION: Comminuted right clavicular fracture similar to that seen on prior plain film examination. Chronic appearing fractures of the right second, third and fourth ribs as described. Single gallstone. Diverticulosis without diverticulitis. Electronically Signed   By: Alcide Clever M.D.   On: 07/11/2020 17:19   CT Cervical Spine Wo Contrast  Result Date: 07/11/2020 CLINICAL DATA:  Larey Seat, hit head, nausea and emesis EXAM: CT HEAD WITHOUT CONTRAST CT MAXILLOFACIAL WITHOUT CONTRAST CT CERVICAL SPINE WITHOUT CONTRAST TECHNIQUE: Multidetector CT imaging of the head, cervical spine, and maxillofacial structures were performed using the standard protocol without intravenous contrast. Multiplanar CT image reconstructions of the cervical spine and maxillofacial structures were also generated. COMPARISON:  07/11/2015 FINDINGS: CT HEAD FINDINGS Brain: No acute infarct or hemorrhage. Lateral ventricles and midline structures are stable, with continued benign basal ganglia calcifications noted. No acute extra-axial fluid collections. No mass effect. Vascular: No hyperdense vessel or unexpected calcification. Skull: Normal. Negative for fracture or focal lesion. Other: None. CT MAXILLOFACIAL FINDINGS Osseous: There is a minimally displaced fracture through the right inferior aspect of the nasal bone. No other acute bony abnormalities. Orbits: Negative. No traumatic or inflammatory finding. Sinuses: Clear. Soft tissues: Minimal right supraorbital soft tissue edema. CT CERVICAL SPINE FINDINGS Alignment: Alignment is anatomic. Skull base and vertebrae: No acute fracture. No primary bone lesion or focal pathologic process. Soft tissues and spinal canal: No prevertebral fluid or swelling. No visible canal hematoma. Disc levels: Mild spondylosis at C4-5 and C5-6. Mild diffuse facet hypertrophy. Left predominant neural foraminal narrowing at C5-6. Upper chest: Airway is  patent.  Lung apices are clear. Other: Reconstructed images demonstrate no additional findings. IMPRESSION: 1. No acute intracranial process. 2. Minimally displaced right nasal bone fracture. 3. Minimal right supraorbital soft tissue swelling. No underlying skull fracture. 4. Mild cervical spondylosis.  No acute cervical spine fracture. Electronically Signed   By: Sharlet Salina M.D.   On: 07/11/2020 17:16   DG Pelvis Portable  Result Date: 07/11/2020 CLINICAL DATA:  Larey Seat, nausea, emesis EXAM: PORTABLE PELVIS 1-2 VIEWS COMPARISON:  10/31/2009 FINDINGS: Supine frontal view of the pelvis demonstrates no fractures. Alignment is anatomic. Joint spaces are well preserved. Sacroiliac joints are normal. IMPRESSION: 1. Unremarkable bony pelvis. Electronically Signed   By: Sharlet Salina M.D.   On: 07/11/2020 16:16   DG Chest Portable 1 View  Result Date: 07/11/2020 CLINICAL DATA:  Fall, possible diminished lung sounds on right EXAM: PORTABLE CHEST 1 VIEW COMPARISON:  2018 FINDINGS: Mild elevation of the right hemidiaphragm. No pleural effusion. No pneumothorax. Cardiomediastinal contours are within normal limits with normal heart size. Acute fracture of the mid right clavicle. IMPRESSION: Acute fracture of the mid right clavicle. No other acute process in the chest. Mild elevation of the right hemidiaphragm. Electronically Signed   By: Guadlupe Spanish M.D.   On: 07/11/2020 16:20   CT Maxillofacial Wo Contrast  Result Date: 07/11/2020 CLINICAL DATA:  Larey Seat, hit head, nausea and emesis EXAM: CT HEAD WITHOUT CONTRAST CT MAXILLOFACIAL WITHOUT CONTRAST CT CERVICAL SPINE WITHOUT CONTRAST TECHNIQUE: Multidetector CT imaging of the head, cervical spine, and maxillofacial structures  were performed using the standard protocol without intravenous contrast. Multiplanar CT image reconstructions of the cervical spine and maxillofacial structures were also generated. COMPARISON:  07/11/2015 FINDINGS: CT HEAD FINDINGS Brain: No  acute infarct or hemorrhage. Lateral ventricles and midline structures are stable, with continued benign basal ganglia calcifications noted. No acute extra-axial fluid collections. No mass effect. Vascular: No hyperdense vessel or unexpected calcification. Skull: Normal. Negative for fracture or focal lesion. Other: None. CT MAXILLOFACIAL FINDINGS Osseous: There is a minimally displaced fracture through the right inferior aspect of the nasal bone. No other acute bony abnormalities. Orbits: Negative. No traumatic or inflammatory finding. Sinuses: Clear. Soft tissues: Minimal right supraorbital soft tissue edema. CT CERVICAL SPINE FINDINGS Alignment: Alignment is anatomic. Skull base and vertebrae: No acute fracture. No primary bone lesion or focal pathologic process. Soft tissues and spinal canal: No prevertebral fluid or swelling. No visible canal hematoma. Disc levels: Mild spondylosis at C4-5 and C5-6. Mild diffuse facet hypertrophy. Left predominant neural foraminal narrowing at C5-6. Upper chest: Airway is patent.  Lung apices are clear. Other: Reconstructed images demonstrate no additional findings. IMPRESSION: 1. No acute intracranial process. 2. Minimally displaced right nasal bone fracture. 3. Minimal right supraorbital soft tissue swelling. No underlying skull fracture. 4. Mild cervical spondylosis.  No acute cervical spine fracture. Electronically Signed   By: Sharlet Salina M.D.   On: 07/11/2020 17:16    My personal review of EKG: Rhythm NSR, Rate 72 /min, QTc 446 ,no Acute ST changes   Assessment & Plan:    Active Problems:   GI hemorrhage   1. GI hemorrhage 1. 2 large bloody bowel movements at home 2. 2 g hemoglobin drop over 1 month 3. Typed and screened in ED 4. PPI twice daily 5. H&H every 8 hours 6. GI consulted 7. N.p.o. 8. Continue to monitor 2. Syncope and collapse 1. Likely secondary to anemia/orthostatic hypotension 2. Orthostatic vitals every shift 3. Continue to monitor  on telemetry 3. Comminuted clavicle fracture 1. Comminuted displaced mid right clavicular fracture 2. Ortho consulted from ED and reports they will see in a.m. 3. Patient in sling 4. Pain control with pain scale 4. Leukocytosis  1. Stress reaction 2. Continue to monitor 5. Hyperlipidemia 1. Continue statin 6. GERD 1. Continue PPI 7. Depression 1. Continue Lexapro   DVT Prophylaxis-   SCDs   AM Labs Ordered, also please review Full Orders  Family Communication: Admission, patients condition and plan of care including tests being ordered have been discussed with the patient and children's father who indicate understanding and agree with the plan and Code Status.  Code Status:  Full  Admission status: Inpatient :The appropriate admission status for this patient is INPATIENT. Inpatient status is judged to be reasonable and necessary in order to provide the required intensity of service to ensure the patient's safety. The patient's presenting symptoms, physical exam findings, and initial radiographic and laboratory data in the context of their chronic comorbidities is felt to place them at high risk for further clinical deterioration. Furthermore, it is not anticipated that the patient will be medically stable for discharge from the hospital within 2 midnights of admission. The following factors support the admission status of inpatient.     The patient's presenting symptoms include bright red blood per rectum and syncope and collapse Physical exam - hypotension on presentation The initial radiographic and laboratory data are worrisome because of Broken clavical, broken nose The chronic co-morbidities include GERD, duodenal diverticulum, osteoporosis       *  I certify that at the point of admission it is my clinical judgment that the patient will require inpatient hospital care spanning beyond 2 midnights from the point of admission due to high intensity of service, high risk for  further deterioration and high frequency of surveillance required.*  Time spent in minutes : 64   Giannina Bartolome B Zierle-Ghosh DO

## 2020-07-11 NOTE — ED Notes (Signed)
Assumed care of patient from [place], [place]

## 2020-07-11 NOTE — ED Provider Notes (Signed)
[facility]-EMERGENCY DEPT Provider Note   CSN: [id number] Arrival date & time: 07/11/20  1447     History Chief Complaint  Patient presents with  . Fall  . Emesis  . Altered Mental Status    Kaylee Pratt is a 64 y.o. female.  HPI      Kaylee Pratt is a 64 y.o. female, with a history of GERD, hyperlipidemia, presenting to the ED with multifaceted chief complaint list.  She states she began to pass bright red blood per rectum with at least 2 episodes of blood into the toilet beginning this morning. She began to feel nauseous and lightheaded.  She thinks she had a syncopal episode and fell.  She does not know whether or not she fell multiple times. She states she remembers lying on the floor, but does not remember how long she was there. She has been experiencing intermittent abdominal cramping for the past week. She complains of pain to the right side of the head and face, neck, right shoulder.  She also complains of shortness of breath. Denies anticoagulation.  She is vaccinated for Covid. Denies fever/chills, other chest pain, urinary symptoms, headache, vomiting, extremity numbness/weakness, vision changes, or any other complaints.  Past Medical History:  Diagnosis Date  . Allergy   . Asthma   . Depression   . Duodenal diverticulum   . GERD (gastroesophageal reflux disease)   . GERD (gastroesophageal reflux disease)   . Hiatal hernia   . Hyperlipidemia   . Osteoporosis     Patient Active Problem List   Diagnosis Date Noted  . GI hemorrhage 07/11/2020  . Retrocalcaneal bursitis, left 05/14/2019  . Overweight (BMI 25.0-29.9) 11/04/2018  . Irritable bowel syndrome 11/15/2017  . Repeated falls 07/11/2015  . Pap smear for cervical cancer screening 05/06/2015  . Hemorrhoids, internal, with bleeding 04/28/2013  . Allergic rhinitis 12/19/2012  . Hyperlipidemia 09/08/2012  . GAD (generalized anxiety disorder) 03/28/2012  . Plantar fasciitis of left  foot 01/02/2012  . Leg length discrepancy 01/02/2012  . General medical examination 11/02/2011  . Insomnia 04/16/2011  . DEPRESSION 12/08/2008  . ASTHMA 12/08/2008  . GERD 12/08/2008  . Osteoporosis 12/08/2008    Past Surgical History:  Procedure Laterality Date  . TUBAL LIGATION       OB History   No obstetric history on file.     Family History  Problem Relation Age of Onset  . Prostate cancer Father   . Other Neg Hx        no early CVA/CAD, no cancer  . Sudden death Neg Hx   . Diabetes Neg Hx   . Heart attack Neg Hx   . Hyperlipidemia Neg Hx   . Hypertension Neg Hx     Social History   Tobacco Use  . Smoking status: Never Smoker  . Smokeless tobacco: Never Used  Vaping Use  . Vaping Use: Never used  Substance Use Topics  . Alcohol use: Yes    Alcohol/week: 2.0 standard drinks    Types: 2 Glasses of wine per week    Comment: wine on occasion  . Drug use: No    Home Medications Prior to Admission medications   Medication Sig Start Date End Date Taking? Authorizing Provider  albuterol (VENTOLIN HFA) 108 (90 Base) MCG/ACT inhaler Inhale 2 puffs into the lungs every 6 (six) hours as needed for wheezing or shortness of breath. 05/13/20  Yes Sheliah Hatch, MD  aspirin EC 81 MG tablet  Take 162 mg by mouth daily.    Yes [provider]  cholecalciferol (VITAMIN D) 1000 UNITS tablet Take 2,000 Units by mouth daily.   Yes [provider]  clonazePAM (KLONOPIN) 1 MG tablet 1 tab po BID prn anxiety Patient taking differently: Take 1 mg by mouth at bedtime.  05/13/20  Yes Sheliah Hatchabori, Katherine E, MD  escitalopram (LEXAPRO) 20 MG tablet TAKE 1 TABLET BY MOUTH EVERY DAY Patient taking differently: Take 10 mg by mouth at bedtime.  01/05/20  Yes Sheliah Hatchabori, Katherine E, MD  omeprazole (PRILOSEC) 40 MG capsule Take 1 capsule (40 mg total) by mouth daily. Patient taking differently: Take 40 mg by mouth daily as needed (indigestion).  05/13/20  Yes Sheliah Hatchabori, Katherine  E, MD  simvastatin (ZOCOR) 20 MG tablet TAKE 1 TABLET BY MOUTH EVERYDAY AT BEDTIME Patient taking differently: Take 10 mg by mouth 2 (two) times a week.  05/13/20  Yes Sheliah Hatchabori, Katherine E, MD  montelukast (SINGULAIR) 10 MG tablet Take 1 tablet (10 mg total) by mouth at bedtime. Patient not taking: Reported on 07/11/2020 10/20/18   Sheliah Hatchabori, Katherine E, MD    Allergies    Amoxil [amoxicillin] and Iohexol  Review of Systems   Review of Systems  Constitutional: Negative for chills and fever.  Eyes: Negative for visual disturbance.  Respiratory: Positive for shortness of breath. Negative for cough.   Cardiovascular: Negative for chest pain.  Gastrointestinal: Positive for abdominal pain, blood in stool and nausea. Negative for vomiting.  Genitourinary: Negative for dysuria.  Musculoskeletal: Positive for neck pain. Negative for back pain.       Pain to the right shoulder and right upper chest.  Neurological: Positive for syncope, weakness and light-headedness. Negative for facial asymmetry and headaches.  All other systems reviewed and are negative.   Physical Exam Updated Vital Signs BP 120/73 (BP Location: Right Arm)   Pulse 76   Temp 98.1 F (36.7 C) (Oral)   Resp 17   Ht 5' (1.524 m)   Wt 58.5 kg   SpO2 100%   BMI 25.19 kg/m   Physical Exam Vitals and nursing note reviewed. Exam conducted with a chaperone present.  Constitutional:      General: She is not in acute distress.    Appearance: She is well-developed. She is not diaphoretic.  HENT:     Head: Normocephalic.     Comments: Abrasions, bruising, swelling, and tenderness to the right forehead, right periorbital region, right zygomatic region.  No noted deformity or instability.  The rest of the scalp and face were examined without additional injuries noted.    Nose: Nose normal.     Mouth/Throat:     Mouth: Mucous membranes are moist.     Pharynx: Oropharynx is clear.  Eyes:     Extraocular Movements: Extraocular  movements intact.     Conjunctiva/sclera: Conjunctivae normal.     Pupils: Pupils are equal, round, and reactive to light.  Cardiovascular:     Rate and Rhythm: Normal rate and regular rhythm.     Pulses: Normal pulses.          Radial pulses are 2+ on the right side and 2+ on the left side.       Posterior tibial pulses are 2+ on the right side and 2+ on the left side.     Heart sounds: Normal heart sounds.     Comments: Tactile temperature in the extremities appropriate and equal bilaterally. Pulmonary:     Effort:  Pulmonary effort is normal. No respiratory distress.     Breath sounds: Normal breath sounds.  Chest:     Chest wall: Swelling and tenderness present.     Comments: Tenderness, bruising, and possible deformity to the right clavicle.  No noted skin tenting.  Tenderness also to the right upper chest without evidence of flail chest. Abdominal:     Palpations: Abdomen is soft.     Tenderness: There is no abdominal tenderness. There is no guarding.  Genitourinary:    Rectum: Guaiac result positive.     Comments: Small amount of bright red blood on the glove following rectal exam.   No noted rectal tenderness or hemorrhoids. No evidence of free-flowing hemorrhage, foreign bodies, or melena. Musculoskeletal:     Cervical back: Neck supple.     Right lower leg: No edema.     Left lower leg: No edema.     Comments: Some tenderness to the cervical spinal region without noted deformity, swelling, or color abnormality.  Tenderness is noted to the right clavicle as described in the chest section.  This limits movement of the right shoulder due to pain.  She has no other evidence of injury to the rest of the right upper extremity.  Normal motor function intact in all other extremities. No midline spinal tenderness.   Overall trauma exam performed without any abnormalities noted other than those mentioned.  Lymphadenopathy:     Cervical: No cervical adenopathy.  Skin:    General:  Skin is warm and dry.  Neurological:     Mental Status: She is alert and oriented to person, place, and time.     Comments: Grip strength equal bilaterally.  No noted facial droop. Strength 5/5 in each of the extremities and equal bilaterally.  Psychiatric:        Mood and Affect: Mood and affect normal.        Speech: Speech normal.        Behavior: Behavior normal.     ED Results / Procedures / Treatments   Labs (all labs ordered are listed, but only abnormal results are displayed) Labs Reviewed  COMPREHENSIVE METABOLIC PANEL - Abnormal; Notable for the following components:      Result Value   CO2 21 (*)    Glucose, Bld 177 (*)    Calcium 8.5 (*)    All other components within normal limits  CBC - Abnormal; Notable for the following components:   WBC 12.6 (*)    Hemoglobin 11.3 (*)    All other components within normal limits  URINALYSIS, ROUTINE W REFLEX MICROSCOPIC - Abnormal; Notable for the following components:   Hgb urine dipstick MODERATE (*)    Leukocytes,Ua TRACE (*)    All other components within normal limits  DIFFERENTIAL - Abnormal; Notable for the following components:   Neutro Abs 10.2 (*)    Abs Immature Granulocytes 0.21 (*)    All other components within normal limits  CBG MONITORING, ED - Abnormal; Notable for the following components:   Glucose-Capillary 165 (*)    All other components within normal limits  POC OCCULT BLOOD, ED - Abnormal; Notable for the following components:   Fecal Occult Bld POSITIVE (*)    All other components within normal limits  RESPIRATORY PANEL BY RT PCR (FLU A&B, COVID)  RAPID URINE DRUG SCREEN, HOSP PERFORMED  ETHANOL  AMMONIA  CK  OCCULT BLOOD X 1 CARD TO LAB, STOOL  CBC  TYPE AND SCREEN  ABO/RH  TROPONIN I (HIGH SENSITIVITY)  TROPONIN I (HIGH SENSITIVITY)   Hemoglobin  Date Value Ref Range Status  07/11/2020 11.3 (L) 12.0 - 15.0 g/dL Final  91/47/8295 62.1 12.0 - 15.0 g/dL Final  30/86/5784 69.6 12.0 - 15.0  g/dL Final  29/52/8413 24.4 12.0 - 15.0 g/dL Final    EKG EKG Interpretation  Date/Time:  Monday July 11 2020 15:56:49 EDT Ventricular Rate:  72 PR Interval:    QRS Duration: 80 QT Interval:  407 QTC Calculation: 446 R Axis:   50 Text Interpretation: Sinus rhythm Abnormal R-wave progression, early transition No STEMI Confirmed by Alvester Chou [phone]) on 07/11/2020 4:06:50 PM   Radiology CT ABDOMEN PELVIS WO CONTRAST  Result Date: 07/11/2020 CLINICAL DATA:  Generalized abdominal pain following fall, initial encounter EXAM: CT CHEST, ABDOMEN AND PELVIS WITHOUT CONTRAST TECHNIQUE: Multidetector CT imaging of the chest, abdomen and pelvis was performed following the standard protocol without IV contrast. COMPARISON:  Chest x-ray from earlier in the same day. FINDINGS: CT CHEST FINDINGS Cardiovascular: Somewhat limited due to lack of IV contrast. Atherosclerotic calcifications are noted. No cardiac enlargement is seen. Mild coronary calcifications are noted. Mediastinum/Nodes: Thoracic inlet is within normal limits. No sizable hilar or mediastinal adenopathy is noted. The esophagus as visualized is within normal limits. Lungs/Pleura: The lungs are well aerated bilaterally. Minimal bibasilar atelectatic changes are seen. No focal confluent infiltrate is noted. No sizable effusion is seen. No parenchymal nodule is noted. Musculoskeletal: Comminuted right clavicular fracture is noted similar to that seen on prior plain film examination. Angulated fractures of the right second, third and fourth ribs anteriorly are seen. No displacement is noted and these are likely chronic in nature. No compression deformities are seen. No sternal fracture is noted. CT ABDOMEN PELVIS FINDINGS Hepatobiliary: Liver is well visualized within normal limits. Gallbladder is well distended. Tiny dependent gallstone is noted. Pancreas: Unremarkable. No pancreatic ductal dilatation or surrounding inflammatory changes.  Spleen: Normal in size without focal abnormality. Adrenals/Urinary Tract: Adrenal glands are within normal limits. Kidneys show no renal calculi or obstructive changes. Ureters are within normal limits to the level of the urinary bladder. Bladder is predominately decompressed. Stomach/Bowel: Colon is predominately decompressed. Mild diverticular changes noted without evidence of diverticulitis. The appendix is unremarkable. Small bowel and stomach are within normal limits with the exception of a large duodenal diverticulum adjacent to the head of the pancreas. Vascular/Lymphatic: No significant vascular findings are present. No enlarged abdominal or pelvic lymph nodes. Reproductive: Uterus and bilateral adnexa are unremarkable. Other: No abdominal wall hernia or abnormality. No abdominopelvic ascites. Musculoskeletal: No acute or significant osseous findings. IMPRESSION: Comminuted right clavicular fracture similar to that seen on prior plain film examination. Chronic appearing fractures of the right second, third and fourth ribs as described. Single gallstone. Diverticulosis without diverticulitis. Electronically Signed   By: Alcide Clever M.D.   On: 07/11/2020 17:19   DG Clavicle Right  Result Date: 07/11/2020 CLINICAL DATA:  Larey Seat, deformity EXAM: RIGHT CLAVICLE - 2+ VIEWS COMPARISON:  07/11/2020, 07/22/2017 FINDINGS: Frontal and lordotic views of the right clavicle demonstrate a comminuted displaced mid right clavicular fracture with 1 shaft width inferior displacement of the lateral fracture fragment. Acromioclavicular and glenohumeral joints are well aligned. The right chest is clear. IMPRESSION: 1. Comminuted displaced mid right clavicular fracture. Electronically Signed   By: Sharlet Salina M.D.   On: 07/11/2020 16:18   CT Head Wo Contrast  Result Date: 07/11/2020 CLINICAL DATA:  Larey Seat, hit head, nausea and emesis EXAM:  CT HEAD WITHOUT CONTRAST CT MAXILLOFACIAL WITHOUT CONTRAST CT CERVICAL SPINE WITHOUT  CONTRAST TECHNIQUE: Multidetector CT imaging of the head, cervical spine, and maxillofacial structures were performed using the standard protocol without intravenous contrast. Multiplanar CT image reconstructions of the cervical spine and maxillofacial structures were also generated. COMPARISON:  07/11/2015 FINDINGS: CT HEAD FINDINGS Brain: No acute infarct or hemorrhage. Lateral ventricles and midline structures are stable, with continued benign basal ganglia calcifications noted. No acute extra-axial fluid collections. No mass effect. Vascular: No hyperdense vessel or unexpected calcification. Skull: Normal. Negative for fracture or focal lesion. Other: None. CT MAXILLOFACIAL FINDINGS Osseous: There is a minimally displaced fracture through the right inferior aspect of the nasal bone. No other acute bony abnormalities. Orbits: Negative. No traumatic or inflammatory finding. Sinuses: Clear. Soft tissues: Minimal right supraorbital soft tissue edema. CT CERVICAL SPINE FINDINGS Alignment: Alignment is anatomic. Skull base and vertebrae: No acute fracture. No primary bone lesion or focal pathologic process. Soft tissues and spinal canal: No prevertebral fluid or swelling. No visible canal hematoma. Disc levels: Mild spondylosis at C4-5 and C5-6. Mild diffuse facet hypertrophy. Left predominant neural foraminal narrowing at C5-6. Upper chest: Airway is patent.  Lung apices are clear. Other: Reconstructed images demonstrate no additional findings. IMPRESSION: 1. No acute intracranial process. 2. Minimally displaced right nasal bone fracture. 3. Minimal right supraorbital soft tissue swelling. No underlying skull fracture. 4. Mild cervical spondylosis.  No acute cervical spine fracture. Electronically Signed   By: Sharlet Salina M.D.   On: 07/11/2020 17:16   CT Chest Wo Contrast  Result Date: 07/11/2020 CLINICAL DATA:  Generalized abdominal pain following fall, initial encounter EXAM: CT CHEST, ABDOMEN AND PELVIS  WITHOUT CONTRAST TECHNIQUE: Multidetector CT imaging of the chest, abdomen and pelvis was performed following the standard protocol without IV contrast. COMPARISON:  Chest x-ray from earlier in the same day. FINDINGS: CT CHEST FINDINGS Cardiovascular: Somewhat limited due to lack of IV contrast. Atherosclerotic calcifications are noted. No cardiac enlargement is seen. Mild coronary calcifications are noted. Mediastinum/Nodes: Thoracic inlet is within normal limits. No sizable hilar or mediastinal adenopathy is noted. The esophagus as visualized is within normal limits. Lungs/Pleura: The lungs are well aerated bilaterally. Minimal bibasilar atelectatic changes are seen. No focal confluent infiltrate is noted. No sizable effusion is seen. No parenchymal nodule is noted. Musculoskeletal: Comminuted right clavicular fracture is noted similar to that seen on prior plain film examination. Angulated fractures of the right second, third and fourth ribs anteriorly are seen. No displacement is noted and these are likely chronic in nature. No compression deformities are seen. No sternal fracture is noted. CT ABDOMEN PELVIS FINDINGS Hepatobiliary: Liver is well visualized within normal limits. Gallbladder is well distended. Tiny dependent gallstone is noted. Pancreas: Unremarkable. No pancreatic ductal dilatation or surrounding inflammatory changes. Spleen: Normal in size without focal abnormality. Adrenals/Urinary Tract: Adrenal glands are within normal limits. Kidneys show no renal calculi or obstructive changes. Ureters are within normal limits to the level of the urinary bladder. Bladder is predominately decompressed. Stomach/Bowel: Colon is predominately decompressed. Mild diverticular changes noted without evidence of diverticulitis. The appendix is unremarkable. Small bowel and stomach are within normal limits with the exception of a large duodenal diverticulum adjacent to the head of the pancreas. Vascular/Lymphatic: No  significant vascular findings are present. No enlarged abdominal or pelvic lymph nodes. Reproductive: Uterus and bilateral adnexa are unremarkable. Other: No abdominal wall hernia or abnormality. No abdominopelvic ascites. Musculoskeletal: No acute or significant osseous findings. IMPRESSION:  Comminuted right clavicular fracture similar to that seen on prior plain film examination. Chronic appearing fractures of the right second, third and fourth ribs as described. Single gallstone. Diverticulosis without diverticulitis. Electronically Signed   By: Alcide Clever M.D.   On: 07/11/2020 17:19   CT Cervical Spine Wo Contrast  Result Date: 07/11/2020 CLINICAL DATA:  Larey Seat, hit head, nausea and emesis EXAM: CT HEAD WITHOUT CONTRAST CT MAXILLOFACIAL WITHOUT CONTRAST CT CERVICAL SPINE WITHOUT CONTRAST TECHNIQUE: Multidetector CT imaging of the head, cervical spine, and maxillofacial structures were performed using the standard protocol without intravenous contrast. Multiplanar CT image reconstructions of the cervical spine and maxillofacial structures were also generated. COMPARISON:  07/11/2015 FINDINGS: CT HEAD FINDINGS Brain: No acute infarct or hemorrhage. Lateral ventricles and midline structures are stable, with continued benign basal ganglia calcifications noted. No acute extra-axial fluid collections. No mass effect. Vascular: No hyperdense vessel or unexpected calcification. Skull: Normal. Negative for fracture or focal lesion. Other: None. CT MAXILLOFACIAL FINDINGS Osseous: There is a minimally displaced fracture through the right inferior aspect of the nasal bone. No other acute bony abnormalities. Orbits: Negative. No traumatic or inflammatory finding. Sinuses: Clear. Soft tissues: Minimal right supraorbital soft tissue edema. CT CERVICAL SPINE FINDINGS Alignment: Alignment is anatomic. Skull base and vertebrae: No acute fracture. No primary bone lesion or focal pathologic process. Soft tissues and spinal  canal: No prevertebral fluid or swelling. No visible canal hematoma. Disc levels: Mild spondylosis at C4-5 and C5-6. Mild diffuse facet hypertrophy. Left predominant neural foraminal narrowing at C5-6. Upper chest: Airway is patent.  Lung apices are clear. Other: Reconstructed images demonstrate no additional findings. IMPRESSION: 1. No acute intracranial process. 2. Minimally displaced right nasal bone fracture. 3. Minimal right supraorbital soft tissue swelling. No underlying skull fracture. 4. Mild cervical spondylosis.  No acute cervical spine fracture. Electronically Signed   By: Sharlet Salina M.D.   On: 07/11/2020 17:16   DG Pelvis Portable  Result Date: 07/11/2020 CLINICAL DATA:  Larey Seat, nausea, emesis EXAM: PORTABLE PELVIS 1-2 VIEWS COMPARISON:  10/31/2009 FINDINGS: Supine frontal view of the pelvis demonstrates no fractures. Alignment is anatomic. Joint spaces are well preserved. Sacroiliac joints are normal. IMPRESSION: 1. Unremarkable bony pelvis. Electronically Signed   By: Sharlet Salina M.D.   On: 07/11/2020 16:16   DG Chest Portable 1 View  Result Date: 07/11/2020 CLINICAL DATA:  Fall, possible diminished lung sounds on right EXAM: PORTABLE CHEST 1 VIEW COMPARISON:  2018 FINDINGS: Mild elevation of the right hemidiaphragm. No pleural effusion. No pneumothorax. Cardiomediastinal contours are within normal limits with normal heart size. Acute fracture of the mid right clavicle. IMPRESSION: Acute fracture of the mid right clavicle. No other acute process in the chest. Mild elevation of the right hemidiaphragm. Electronically Signed   By: Guadlupe Spanish M.D.   On: 07/11/2020 16:20   CT Maxillofacial Wo Contrast  Result Date: 07/11/2020 CLINICAL DATA:  Larey Seat, hit head, nausea and emesis EXAM: CT HEAD WITHOUT CONTRAST CT MAXILLOFACIAL WITHOUT CONTRAST CT CERVICAL SPINE WITHOUT CONTRAST TECHNIQUE: Multidetector CT imaging of the head, cervical spine, and maxillofacial structures were performed  using the standard protocol without intravenous contrast. Multiplanar CT image reconstructions of the cervical spine and maxillofacial structures were also generated. COMPARISON:  07/11/2015 FINDINGS: CT HEAD FINDINGS Brain: No acute infarct or hemorrhage. Lateral ventricles and midline structures are stable, with continued benign basal ganglia calcifications noted. No acute extra-axial fluid collections. No mass effect. Vascular: No hyperdense vessel or unexpected calcification. Skull: Normal. Negative  for fracture or focal lesion. Other: None. CT MAXILLOFACIAL FINDINGS Osseous: There is a minimally displaced fracture through the right inferior aspect of the nasal bone. No other acute bony abnormalities. Orbits: Negative. No traumatic or inflammatory finding. Sinuses: Clear. Soft tissues: Minimal right supraorbital soft tissue edema. CT CERVICAL SPINE FINDINGS Alignment: Alignment is anatomic. Skull base and vertebrae: No acute fracture. No primary bone lesion or focal pathologic process. Soft tissues and spinal canal: No prevertebral fluid or swelling. No visible canal hematoma. Disc levels: Mild spondylosis at C4-5 and C5-6. Mild diffuse facet hypertrophy. Left predominant neural foraminal narrowing at C5-6. Upper chest: Airway is patent.  Lung apices are clear. Other: Reconstructed images demonstrate no additional findings. IMPRESSION: 1. No acute intracranial process. 2. Minimally displaced right nasal bone fracture. 3. Minimal right supraorbital soft tissue swelling. No underlying skull fracture. 4. Mild cervical spondylosis.  No acute cervical spine fracture. Electronically Signed   By: Sharlet Salina M.D.   On: 07/11/2020 17:16    Procedures Procedures (including critical care time)  Medications Ordered in ED Medications  ondansetron (ZOFRAN) injection 4 mg (4 mg Intravenous Given 07/11/20 1629)  lactated ringers bolus 2,000 mL (0 mLs Intravenous Stopped 07/11/20 1916)  fentaNYL (SUBLIMAZE) injection  50 mcg (50 mcg Intravenous Given 07/11/20 1749)  morphine 4 MG/ML injection 4 mg (4 mg Intravenous Given 07/11/20 1934)    ED Course  I have reviewed the triage vital signs and the nursing notes.  Pertinent labs & imaging results that were available during my care of the patient were reviewed by me and considered in my medical decision making (see chart for details).  Clinical Course as of Jul 12 2115  Mon Jul 11, 2020  1717 Actually noted to be 137/85.  BP(!): 127/105 [SJ]  1718 Patient states she feels a little better.   [SJ]  1857 Spoke with Dr. Yevette Edwards, orthopedic surgeon.  We discussed the patient's injury, specifically her clavicular fracture. States this is likely something that can be followed up outpatient.  They will take a look at her record.   [SJ]  2102 Spjoke with Dr. Carren Rang, hospitalist.  Agrees to admit the patient.   [SJ]    Clinical Course User Index [SJ] Brylan Dec, Hillard Danker, PA-C   MDM Rules/Calculators/A&P                          Patient presents for evaluation following a syncopal episode.  She endorses at least 2 episodes of bright red bleeding per rectum into the toilet today. Initially noted to have hypotension to 90/60, improved with IV fluids.  I personally reviewed and interpreted the patient's labs and imaging studies. Her hemoglobin was 13.7 in September, now 11.3.  She has a comminuted right clavicular fracture noted on imaging.  No evidence of neurovascular compromise in the extremity. Right nasal bone fracture.  EDP attending, Dr. Renaye Rakers, spoke with Dr. Lavon Paganini, GI, regarding this patient.  Hospitalist admit.  They will continue to consult on the patient.  They state if patient has another large bloody bowel movement, recommend CTA of the abdomen.  Due to the patient's rectal bleeding with initial hypotension and her drop in hemoglobin, combined with her syncope, I would benefit from admission.  Findings and plan of care discussed with Marguarite Arbour, MD. Dr. Renaye Rakers personally evaluated and examined this patient.  Vitals:   07/11/20 1503 07/11/20 1615 07/11/20 1630 07/11/20 1715  BP: 120/73 (!) 143/72 (!) 127/105 [name] Kitchen)  142/67  Pulse: 76 73 84 81  Resp: 17 (!) 21 18 19   Temp: 98.1 F (36.7 C)     TempSrc: Oral     SpO2: 100% 99% 100% 99%  Weight:      Height:         Final Clinical Impression(s) / ED Diagnoses Final diagnoses:  Rectal bleeding  Syncope and collapse    Rx / DC Orders ED Discharge Orders    None       Xana, Bradt 07/11/20 2116    Terald Sleeper, MD 07/12/20 1143

## 2020-07-11 NOTE — ED Notes (Signed)
Ice pack provided

## 2020-07-11 NOTE — ED Notes (Signed)
c-collar removed

## 2020-07-11 NOTE — ED Triage Notes (Signed)
Paitient had a fall today and hit her head. Patient is currently having nausea and emesis and has had multiple episodes of LOC and non-responsiveness with EMS where even a heavy sternal rub would not awaken her. Patient is not oriented at this time and reports she was having weakness and passing blood before this today

## 2020-07-12 ENCOUNTER — Inpatient Hospital Stay (HOSPITAL_COMMUNITY): Payer: BC Managed Care – PPO

## 2020-07-12 DIAGNOSIS — K922 Gastrointestinal hemorrhage, unspecified: Secondary | ICD-10-CM

## 2020-07-12 DIAGNOSIS — K625 Hemorrhage of anus and rectum: Secondary | ICD-10-CM | POA: Diagnosis not present

## 2020-07-12 LAB — PREPARE RBC (CROSSMATCH)

## 2020-07-12 LAB — COMPREHENSIVE METABOLIC PANEL
ALT: 25 U/L (ref 0–44)
AST: 20 U/L (ref 15–41)
Albumin: 3.2 g/dL — ABNORMAL LOW (ref 3.5–5.0)
Alkaline Phosphatase: 45 U/L (ref 38–126)
Anion gap: 10 (ref 5–15)
BUN: 14 mg/dL (ref 8–23)
CO2: 22 mmol/L (ref 22–32)
Calcium: 8.3 mg/dL — ABNORMAL LOW (ref 8.9–10.3)
Chloride: 105 mmol/L (ref 98–111)
Creatinine, Ser: 0.65 mg/dL (ref 0.44–1.00)
GFR, Estimated: >60 mL/min (ref >60)
Glucose, Bld: 103 mg/dL — ABNORMAL HIGH (ref 70–99)
Potassium: 4.3 mmol/L (ref 3.5–5.1)
Sodium: 137 mmol/L (ref 135–145)
Total Bilirubin: 0.8 mg/dL (ref 0.3–1.2)
Total Protein: 5.9 g/dL — ABNORMAL LOW (ref 6.5–8.1)

## 2020-07-12 LAB — CBC WITH DIFFERENTIAL/PLATELET
Abs Immature Granulocytes: 0.05 10*3/uL (ref 0.00–0.07)
Basophils Absolute: 0 10*3/uL (ref 0.0–0.1)
Basophils Relative: 0 %
Eosinophils Absolute: 0 10*3/uL (ref 0.0–0.5)
Eosinophils Relative: 0 %
HCT: 22.1 % — ABNORMAL LOW (ref 36.0–46.0)
Hemoglobin: 7.1 g/dL — ABNORMAL LOW (ref 12.0–15.0)
Immature Granulocytes: 1 %
Lymphocytes Relative: 26 %
Lymphs Abs: 1.8 10*3/uL (ref 0.7–4.0)
MCH: 30.6 pg (ref 26.0–34.0)
MCHC: 32.1 g/dL (ref 30.0–36.0)
MCV: 95.3 fL (ref 80.0–100.0)
Monocytes Absolute: 0.5 10*3/uL (ref 0.1–1.0)
Monocytes Relative: 7 %
Neutro Abs: 4.5 10*3/uL (ref 1.7–7.7)
Neutrophils Relative %: 66 %
Platelets: 214 10*3/uL (ref 150–400)
RBC: 2.32 MIL/uL — ABNORMAL LOW (ref 3.87–5.11)
RDW: 12.9 % (ref 11.5–15.5)
WBC: 6.9 10*3/uL (ref 4.0–10.5)
nRBC: 0 % (ref 0.0–0.2)

## 2020-07-12 LAB — HEMOGLOBIN AND HEMATOCRIT, BLOOD
HCT: 29.2 % — ABNORMAL LOW (ref 36.0–46.0)
HCT: 32.2 % — ABNORMAL LOW (ref 36.0–46.0)
Hemoglobin: 9 g/dL — ABNORMAL LOW (ref 12.0–15.0)
Hemoglobin: 10.6 g/dL — ABNORMAL LOW (ref 12.0–15.0)

## 2020-07-12 LAB — PROTIME-INR
INR: 1.1 (ref 0.8–1.2)
Prothrombin Time: 13.6 seconds (ref 11.4–15.2)

## 2020-07-12 LAB — MAGNESIUM: Magnesium: 1.9 mg/dL (ref 1.7–2.4)

## 2020-07-12 LAB — HIV ANTIBODY (ROUTINE TESTING W REFLEX): HIV Screen 4th Generation wRfx: NONREACTIVE

## 2020-07-12 MED ORDER — SODIUM CHLORIDE 0.9% IV SOLUTION: Freq: Once | INTRAVENOUS | Status: AC

## 2020-07-12 MED ORDER — SODIUM CHLORIDE 0.9 % IV BOLUS
1000.0000 mL | Freq: Once | INTRAVENOUS | Status: AC
  Administered 2020-07-12: 1000 mL via INTRAVENOUS

## 2020-07-12 MED ORDER — PEG-KCL-NACL-NASULF-NA ASC-C 100 G PO SOLR: 1.0000 | Freq: Once | ORAL | Status: DC

## 2020-07-12 MED ORDER — PEG-KCL-NACL-NASULF-NA ASC-C 100 G PO SOLR
0.5000 | Freq: Once | ORAL | Status: AC
  Administered 2020-07-13: 100 g via ORAL
  Filled 2020-07-12: qty 1

## 2020-07-12 MED ORDER — PEG-KCL-NACL-NASULF-NA ASC-C 100 G PO SOLR
0.5000 | Freq: Once | ORAL | Status: AC
  Administered 2020-07-12: 100 g via ORAL
  Filled 2020-07-12: qty 1

## 2020-07-12 MED ORDER — PANTOPRAZOLE SODIUM 40 MG IV SOLR: 40.0000 mg | Freq: Two times a day (BID) | INTRAVENOUS | Status: DC

## 2020-07-12 MED ORDER — SODIUM CHLORIDE 0.9 % IV SOLN
8.0000 mg/h | INTRAVENOUS | Status: DC
  Administered 2020-07-12 – 2020-07-13 (×2): 8 mg/h via INTRAVENOUS
  Filled 2020-07-12 (×4): qty 80

## 2020-07-12 MED ORDER — TECHNETIUM TC 99M-LABELED RED BLOOD CELLS IV KIT
21.3000 | PACK | Freq: Once | INTRAVENOUS | Status: AC
  Administered 2020-07-12: 21.3 via INTRAVENOUS

## 2020-07-12 NOTE — Progress Notes (Signed)
First unit of blood complete at this time.  This RN has accompanied patient to [facility] med for a bleeding scan.  Incomplete vital signs obtained as no oxygen sensor or thermometer available.  Will obtain complete set of vitals once we return to the unit

## 2020-07-12 NOTE — H&P (View-Only) (Signed)
 Referring Provider:  [facility]         Primary Care Physician:  Tabori, Katherine E, MD Primary Gastroenterologist:  Previously David Patterson, MD        We were asked to see this patient for:    GI bleed              ASSESSMENT / PLAN:    # GI bleed with acute blood loss anemia and syncope. At this point it isn't clear if bleed is lower vrs a brisk upper bleed. Even though BUN normal she endorses recent upper abdominal pain, black stool yesterday ( in addition to bright red blood) and she recently started taking aspirin.   --Nuclear bleeding scan has already been ordered. If positive then will need CT angiogram. However patient has IV contrast allergy so would first need Benadryl / prednisone protocol prior to CTA. If bleeding scan negative then will need EGD --Keep NPO --Blood transfusion has already been ordered.  --Trend hgb.   # Right clavicular fracture. Minimally displaced right nasal bone fracture post fall   # Cholelithiasis, seems to be asymptomatic at this point.    # GERD, on Prilosec     HPI:                                                                                                                             Chief Complaint: GI bleed  Kaylee Pratt is a 64 y.o. female with a pmh significant for, not necessarily limited to: GERD, hyperlipidemia, osteoporosis, GAD, asthma, diverticulosis, cholelithiasis  Patient presented to ED yesterday with nausea, vomiting, multiple episodes of LOC. Unconscious when EMS arrived. Patent recalls having a couple of episodes of rectal bleeding prior to syncopal episode. Last week she started having diffuse upper abdominal pain. Pain was constant, not related to eating. She doesn't know if emesis yesterday was dark but she does recall having two episodes of black stool yesterday prior to passing bright red blood. Patient started taking a baby asa about 2 weeks ago. Denies other NSAID use. No bismuth or oral iron intake. Hgb down  from 13.7 to 11.3 in ED yesterday. BUN normal. Her vital signs were relatively stable. Non-contrast CT scan abd / pelvis negative for acute abnormalities except for right clavicular fracture  This am patient had another episode of hematochezia and syncopal episode in hospital bathroom. Rapid response called, IVF ordered. Stat bleeding scan ordered. Her hgb has continued to decline, down to 7.1 this am.    PREVIOUS ENDOSCOPIC EVALUATIONS / PERTINENT STUDIES    EGD 2014 for GERD and dysphagia --4 cm hiatal hernia  Screening colonoscopy 2014 --normal except for hemorrhoids.    Past Medical History:  Diagnosis Date  . Allergy   . Asthma   . Depression   . Duodenal diverticulum   . GERD (gastroesophageal reflux disease)   . GERD (gastroesophageal reflux disease)   . Hiatal hernia   . Hyperlipidemia   .   Osteoporosis     Past Surgical History:  Procedure Laterality Date  . TUBAL LIGATION      Prior to Admission medications   Medication Sig Start Date End Date Taking? Authorizing Provider  albuterol (VENTOLIN HFA) 108 (90 Base) MCG/ACT inhaler Inhale 2 puffs into the lungs every 6 (six) hours as needed for wheezing or shortness of breath. 05/13/20  Yes Tabori, Katherine E, MD  aspirin EC 81 MG tablet Take 162 mg by mouth daily.    Yes [provider]  cholecalciferol (VITAMIN D) 1000 UNITS tablet Take 2,000 Units by mouth daily.   Yes [provider]  clonazePAM (KLONOPIN) 1 MG tablet 1 tab po BID prn anxiety Patient taking differently: Take 1 mg by mouth at bedtime.  05/13/20  Yes Tabori, Katherine E, MD  escitalopram (LEXAPRO) 20 MG tablet TAKE 1 TABLET BY MOUTH EVERY DAY Patient taking differently: Take 10 mg by mouth at bedtime.  01/05/20  Yes Tabori, Katherine E, MD  omeprazole (PRILOSEC) 40 MG capsule Take 1 capsule (40 mg total) by mouth daily. Patient taking differently: Take 40 mg by mouth daily as needed (indigestion).  05/13/20  Yes Tabori, Katherine E, MD    simvastatin (ZOCOR) 20 MG tablet TAKE 1 TABLET BY MOUTH EVERYDAY AT BEDTIME Patient taking differently: Take 10 mg by mouth 2 (two) times a week.  05/13/20  Yes Tabori, Katherine E, MD  montelukast (SINGULAIR) 10 MG tablet Take 1 tablet (10 mg total) by mouth at bedtime. Patient not taking: Reported on 07/11/2020 10/20/18   Tabori, Katherine E, MD    Current Facility-Administered Medications  Medication Dose Route Frequency Provider Last Rate Last Admin  . 0.9 %  sodium chloride infusion (Manually program via Guardrails IV Fluids)   Intravenous Once Ghimire, Kuber, MD      . 0.9 %  sodium chloride infusion   Intravenous Continuous Zierle-Ghosh, Asia B, DO 100 mL/hr at 07/12/20 0102 New Bag at 07/12/20 0102  . acetaminophen (TYLENOL) tablet 650 mg  650 mg Oral Q6H PRN Zierle-Ghosh, Asia B, DO   650 mg at 07/12/20 0629   Or  . acetaminophen (TYLENOL) suppository 650 mg  650 mg Rectal Q6H PRN Zierle-Ghosh, Asia B, DO      . albuterol (VENTOLIN HFA) 108 (90 Base) MCG/ACT inhaler 2 puff  2 puff Inhalation Q6H PRN Zierle-Ghosh, Asia B, DO      . clonazePAM (KLONOPIN) tablet 1 mg  1 mg Oral QHS Zierle-Ghosh, Asia B, DO      . escitalopram (LEXAPRO) tablet 10 mg  10 mg Oral QHS Zierle-Ghosh, Asia B, DO      . methocarbamol (ROBAXIN) 500 mg in dextrose 5 % 50 mL IVPB  500 mg Intravenous Q6H PRN Zierle-Ghosh, Asia B, DO      . montelukast (SINGULAIR) tablet 10 mg  10 mg Oral QHS Zierle-Ghosh, Asia B, DO   10 mg at 07/12/20 0104  . morphine 2 MG/ML injection 2 mg  2 mg Intravenous Q2H PRN Zierle-Ghosh, Asia B, DO      . ondansetron (ZOFRAN) tablet 4 mg  4 mg Oral Q6H PRN Zierle-Ghosh, Asia B, DO       Or  . ondansetron (ZOFRAN) injection 4 mg  4 mg Intravenous Q6H PRN Zierle-Ghosh, Asia B, DO      . pantoprazole (PROTONIX) injection 40 mg  40 mg Intravenous Q12H Zierle-Ghosh, Asia B, DO   40 mg at 07/12/20 0104  . [START ON 07/14/2020] simvastatin (ZOCOR) tablet 10   mg  10 mg Oral Once per day on Mon Thu  Zierle-Ghosh, Asia B, DO      . sodium chloride 0.9 % bolus 1,000 mL  1,000 mL Intravenous Once Ghimire, Kuber, MD        Allergies as of 07/11/2020 - Review Complete 07/11/2020  Allergen Reaction Noted  . Amoxil [amoxicillin] Rash 07/21/2015  . Iohexol Hives 06/10/2015    Family History  Problem Relation Age of Onset  . Prostate cancer Father   . Other Neg Hx        no early CVA/CAD, no cancer  . Sudden death Neg Hx   . Diabetes Neg Hx   . Heart attack Neg Hx   . Hyperlipidemia Neg Hx   . Hypertension Neg Hx     Social History   Socioeconomic History  . Marital status: Married    Spouse name: Not on file  . Number of children: 2  . Years of education: Not on file  . Highest education level: Not on file  Occupational History    Employer: [employer]  Tobacco Use  . Smoking status: Never Smoker  . Smokeless tobacco: Never Used  Vaping Use  . Vaping Use: Never used  Substance and Sexual Activity  . Alcohol use: Yes    Alcohol/week: 2.0 standard drinks    Types: 2 Glasses of wine per week    Comment: wine on occasion  . Drug use: No  . Sexual activity: Not on file  Other Topics Concern  . Not on file  Social History Narrative   Occupation: Sr Program Analyst   Married   Will 23     [name] 19      Caffeine Use:  Rarely   Regular exercise:  4 x weekly            Social Determinants of Health   Financial Resource Strain:   . Difficulty of Paying Living Expenses: Not on file  Food Insecurity:   . Worried About Running Out of Food in the Last Year: Not on file  . Ran Out of Food in the Last Year: Not on file  Transportation Needs:   . Lack of Transportation (Medical): Not on file  . Lack of Transportation (Non-Medical): Not on file  Physical Activity:   . Days of Exercise per Week: Not on file  . Minutes of Exercise per Session: Not on file  Stress:   . Feeling of Stress : Not on file  Social Connections:   . Frequency of Communication with  Friends and Family: Not on file  . Frequency of Social Gatherings with Friends and Family: Not on file  . Attends Religious Services: Not on file  . Active Member of Clubs or Organizations: Not on file  . Attends Club or Organization Meetings: Not on file  . Marital Status: Not on file  Intimate Partner Violence:   . Fear of Current or Ex-Partner: Not on file  . Emotionally Abused: Not on file  . Physically Abused: Not on file  . Sexually Abused: Not on file    Review of Systems: All systems reviewed and negative except where noted in HPI.  OBJECTIVE:    Physical Exam: Vital signs in last 24 hours: Temp:  [98.1 F (36.7 C)-98.9 F (37.2 C)] 98.9 F (37.2 C) (11/02 0807) Pulse Rate:  [73-102] 88 (11/02 0807) Resp:  [15-21] 15 (11/02 0807) BP: (99-143)/(44-105) 123/56 (11/02 0830) SpO2:  [94 %-100 %] 98 % (11/02 0807)   Weight:  [58.5 kg] 58.5 kg (11/01 1456) Last BM Date: 07/11/20 General:   Mild weakness , well-developed,  Asian female in NAD Psych:  Pleasant, cooperative.  Eyes:  Pupils equal, sclera clear, no icterus.   Conjunctiva pale. Ears:  Normal auditory acuity. Nose:  No deformity, discharge,  or lesions. Neck:  Supple; no masses Lungs:  Clear throughout to auscultation.   No wheezes, crackles, or rhonchi.  Heart:  Regular rate and rhythm; no murmurs, no lower extremity edema Abdomen:  Soft, non-distended, minimal RUQ tenderness, BS active, no palp mass   Rectal:  Deferred  Msk:  Symmetrical without gross deformities. . Neurologic:   oriented x4;  grossly normal neurologically. Skin:  Intact without significant lesions or rashes.  Filed Weights   07/11/20 1456  Weight: 58.5 kg     Scheduled inpatient medications . sodium chloride   Intravenous Once  . clonazePAM  1 mg Oral QHS  . escitalopram  10 mg Oral QHS  . montelukast  10 mg Oral QHS  . pantoprazole (PROTONIX) IV  40 mg Intravenous Q12H  . [START ON 07/14/2020] simvastatin  10 mg Oral Once per day  on Mon Thu      Intake/Output from previous day: 11/01 0701 - 11/02 0700 In: 386.1 [I.V.:386.1] Out: -  Intake/Output this shift: No intake/output data recorded.   Lab Results: Recent Labs    07/11/20 1500 07/11/20 1500 07/11/20 2115 07/12/20 0343 07/12/20 0854  WBC 12.6*  --  10.0  --  6.9  HGB 11.3*   < > 9.1* 9.0* 7.1*  HCT 36.3   < > 28.2* 29.2* 22.1*  PLT 304  --  248  --  214   < > = values in this interval not displayed.   BMET Recent Labs    07/11/20 1500 07/12/20 0343  NA 136 137  K 4.5 4.3  CL 104 105  CO2 21* 22  GLUCOSE 177* 103*  BUN 22 14  CREATININE 0.77 0.65  CALCIUM 8.5* 8.3*   LFT Recent Labs    07/12/20 0343  PROT 5.9*  ALBUMIN 3.2*  AST 20  ALT 25  ALKPHOS 45  BILITOT 0.8   PT/INR Recent Labs    07/12/20 0343  LABPROT 13.6  INR 1.1   Hepatitis Panel No results for input(s): HEPBSAG, HCVAB, HEPAIGM, HEPBIGM in the last 72 hours.   . CBC Latest Ref Rng & Units 07/12/2020 07/12/2020 07/11/2020  WBC 4.0 - 10.5 K/uL 6.9 - 10.0  Hemoglobin 12.0 - 15.0 g/dL 7.1(L) 9.0(L) 9.1(L)  Hematocrit 36 - 46 % 22.1(L) 29.2(L) 28.2(L)  Platelets 150 - 400 K/uL 214 - 248    . CMP Latest Ref Rng & Units 07/12/2020 07/11/2020 05/13/2020  Glucose 70 - 99 mg/dL 103(H) 177(H) 93  BUN 8 - 23 mg/dL 14 22 14  Creatinine 0.44 - 1.00 mg/dL 0.65 0.77 0.69  Sodium 135 - 145 mmol/L 137 136 140  Potassium 3.5 - 5.1 mmol/L 4.3 4.5 4.7  Chloride 98 - 111 mmol/L 105 104 105  CO2 22 - 32 mmol/L 22 21(L) 27  Calcium 8.9 - 10.3 mg/dL 8.3(L) 8.5(L) 9.4  Total Protein 6.5 - 8.1 g/dL 5.9(L) 7.3 7.1  Total Bilirubin 0.3 - 1.2 mg/dL 0.8 0.9 0.5  Alkaline Phos 38 - 126 U/L 45 58 68  AST 15 - 41 U/L 20 30 26  ALT 0 - 44 U/L 25 32 28   Studies/Results: CT ABDOMEN PELVIS WO CONTRAST  Result Date: 07/11/2020   CLINICAL DATA:  Generalized abdominal pain following fall, initial encounter EXAM: CT CHEST, ABDOMEN AND PELVIS WITHOUT CONTRAST TECHNIQUE: Multidetector CT  imaging of the chest, abdomen and pelvis was performed following the standard protocol without IV contrast. COMPARISON:  Chest x-ray from earlier in the same day. FINDINGS: CT CHEST FINDINGS Cardiovascular: Somewhat limited due to lack of IV contrast. Atherosclerotic calcifications are noted. No cardiac enlargement is seen. Mild coronary calcifications are noted. Mediastinum/Nodes: Thoracic inlet is within normal limits. No sizable hilar or mediastinal adenopathy is noted. The esophagus as visualized is within normal limits. Lungs/Pleura: The lungs are well aerated bilaterally. Minimal bibasilar atelectatic changes are seen. No focal confluent infiltrate is noted. No sizable effusion is seen. No parenchymal nodule is noted. Musculoskeletal: Comminuted right clavicular fracture is noted similar to that seen on prior plain film examination. Angulated fractures of the right second, third and fourth ribs anteriorly are seen. No displacement is noted and these are likely chronic in nature. No compression deformities are seen. No sternal fracture is noted. CT ABDOMEN PELVIS FINDINGS Hepatobiliary: Liver is well visualized within normal limits. Gallbladder is well distended. Tiny dependent gallstone is noted. Pancreas: Unremarkable. No pancreatic ductal dilatation or surrounding inflammatory changes. Spleen: Normal in size without focal abnormality. Adrenals/Urinary Tract: Adrenal glands are within normal limits. Kidneys show no renal calculi or obstructive changes. Ureters are within normal limits to the level of the urinary bladder. Bladder is predominately decompressed. Stomach/Bowel: Colon is predominately decompressed. Mild diverticular changes noted without evidence of diverticulitis. The appendix is unremarkable. Small bowel and stomach are within normal limits with the exception of a large duodenal diverticulum adjacent to the head of the pancreas. Vascular/Lymphatic: No significant vascular findings are present.  No enlarged abdominal or pelvic lymph nodes. Reproductive: Uterus and bilateral adnexa are unremarkable. Other: No abdominal wall hernia or abnormality. No abdominopelvic ascites. Musculoskeletal: No acute or significant osseous findings. IMPRESSION: Comminuted right clavicular fracture similar to that seen on prior plain film examination. Chronic appearing fractures of the right second, third and fourth ribs as described. Single gallstone. Diverticulosis without diverticulitis. Electronically Signed   By: Mark  Lukens M.D.   On: 07/11/2020 17:19   DG Clavicle Right  Result Date: 07/11/2020 CLINICAL DATA:  Fell, deformity EXAM: RIGHT CLAVICLE - 2+ VIEWS COMPARISON:  07/11/2020, 07/22/2017 FINDINGS: Frontal and lordotic views of the right clavicle demonstrate a comminuted displaced mid right clavicular fracture with 1 shaft width inferior displacement of the lateral fracture fragment. Acromioclavicular and glenohumeral joints are well aligned. The right chest is clear. IMPRESSION: 1. Comminuted displaced mid right clavicular fracture. Electronically Signed   By: Michael  Brown M.D.   On: 07/11/2020 16:18   CT Head Wo Contrast  Result Date: 07/11/2020 CLINICAL DATA:  Fell, hit head, nausea and emesis EXAM: CT HEAD WITHOUT CONTRAST CT MAXILLOFACIAL WITHOUT CONTRAST CT CERVICAL SPINE WITHOUT CONTRAST TECHNIQUE: Multidetector CT imaging of the head, cervical spine, and maxillofacial structures were performed using the standard protocol without intravenous contrast. Multiplanar CT image reconstructions of the cervical spine and maxillofacial structures were also generated. COMPARISON:  07/11/2015 FINDINGS: CT HEAD FINDINGS Brain: No acute infarct or hemorrhage. Lateral ventricles and midline structures are stable, with continued benign basal ganglia calcifications noted. No acute extra-axial fluid collections. No mass effect. Vascular: No hyperdense vessel or unexpected calcification. Skull: Normal. Negative for  fracture or focal lesion. Other: None. CT MAXILLOFACIAL FINDINGS Osseous: There is a minimally displaced fracture through the right inferior aspect of the nasal bone. No other   acute bony abnormalities. Orbits: Negative. No traumatic or inflammatory finding. Sinuses: Clear. Soft tissues: Minimal right supraorbital soft tissue edema. CT CERVICAL SPINE FINDINGS Alignment: Alignment is anatomic. Skull base and vertebrae: No acute fracture. No primary bone lesion or focal pathologic process. Soft tissues and spinal canal: No prevertebral fluid or swelling. No visible canal hematoma. Disc levels: Mild spondylosis at C4-5 and C5-6. Mild diffuse facet hypertrophy. Left predominant neural foraminal narrowing at C5-6. Upper chest: Airway is patent.  Lung apices are clear. Other: Reconstructed images demonstrate no additional findings. IMPRESSION: 1. No acute intracranial process. 2. Minimally displaced right nasal bone fracture. 3. Minimal right supraorbital soft tissue swelling. No underlying skull fracture. 4. Mild cervical spondylosis.  No acute cervical spine fracture. Electronically Signed   By: Michael  Brown M.D.   On: 07/11/2020 17:16   CT Chest Wo Contrast  Result Date: 07/11/2020 CLINICAL DATA:  Generalized abdominal pain following fall, initial encounter EXAM: CT CHEST, ABDOMEN AND PELVIS WITHOUT CONTRAST TECHNIQUE: Multidetector CT imaging of the chest, abdomen and pelvis was performed following the standard protocol without IV contrast. COMPARISON:  Chest x-ray from earlier in the same day. FINDINGS: CT CHEST FINDINGS Cardiovascular: Somewhat limited due to lack of IV contrast. Atherosclerotic calcifications are noted. No cardiac enlargement is seen. Mild coronary calcifications are noted. Mediastinum/Nodes: Thoracic inlet is within normal limits. No sizable hilar or mediastinal adenopathy is noted. The esophagus as visualized is within normal limits. Lungs/Pleura: The lungs are well aerated bilaterally.  Minimal bibasilar atelectatic changes are seen. No focal confluent infiltrate is noted. No sizable effusion is seen. No parenchymal nodule is noted. Musculoskeletal: Comminuted right clavicular fracture is noted similar to that seen on prior plain film examination. Angulated fractures of the right second, third and fourth ribs anteriorly are seen. No displacement is noted and these are likely chronic in nature. No compression deformities are seen. No sternal fracture is noted. CT ABDOMEN PELVIS FINDINGS Hepatobiliary: Liver is well visualized within normal limits. Gallbladder is well distended. Tiny dependent gallstone is noted. Pancreas: Unremarkable. No pancreatic ductal dilatation or surrounding inflammatory changes. Spleen: Normal in size without focal abnormality. Adrenals/Urinary Tract: Adrenal glands are within normal limits. Kidneys show no renal calculi or obstructive changes. Ureters are within normal limits to the level of the urinary bladder. Bladder is predominately decompressed. Stomach/Bowel: Colon is predominately decompressed. Mild diverticular changes noted without evidence of diverticulitis. The appendix is unremarkable. Small bowel and stomach are within normal limits with the exception of a large duodenal diverticulum adjacent to the head of the pancreas. Vascular/Lymphatic: No significant vascular findings are present. No enlarged abdominal or pelvic lymph nodes. Reproductive: Uterus and bilateral adnexa are unremarkable. Other: No abdominal wall hernia or abnormality. No abdominopelvic ascites. Musculoskeletal: No acute or significant osseous findings. IMPRESSION: Comminuted right clavicular fracture similar to that seen on prior plain film examination. Chronic appearing fractures of the right second, third and fourth ribs as described. Single gallstone. Diverticulosis without diverticulitis. Electronically Signed   By: Mark  Lukens M.D.   On: 07/11/2020 17:19   CT Cervical Spine Wo  Contrast  Result Date: 07/11/2020 CLINICAL DATA:  Fell, hit head, nausea and emesis EXAM: CT HEAD WITHOUT CONTRAST CT MAXILLOFACIAL WITHOUT CONTRAST CT CERVICAL SPINE WITHOUT CONTRAST TECHNIQUE: Multidetector CT imaging of the head, cervical spine, and maxillofacial structures were performed using the standard protocol without intravenous contrast. Multiplanar CT image reconstructions of the cervical spine and maxillofacial structures were also generated. COMPARISON:  07/11/2015 FINDINGS: CT HEAD FINDINGS Brain:   No acute infarct or hemorrhage. Lateral ventricles and midline structures are stable, with continued benign basal ganglia calcifications noted. No acute extra-axial fluid collections. No mass effect. Vascular: No hyperdense vessel or unexpected calcification. Skull: Normal. Negative for fracture or focal lesion. Other: None. CT MAXILLOFACIAL FINDINGS Osseous: There is a minimally displaced fracture through the right inferior aspect of the nasal bone. No other acute bony abnormalities. Orbits: Negative. No traumatic or inflammatory finding. Sinuses: Clear. Soft tissues: Minimal right supraorbital soft tissue edema. CT CERVICAL SPINE FINDINGS Alignment: Alignment is anatomic. Skull base and vertebrae: No acute fracture. No primary bone lesion or focal pathologic process. Soft tissues and spinal canal: No prevertebral fluid or swelling. No visible canal hematoma. Disc levels: Mild spondylosis at C4-5 and C5-6. Mild diffuse facet hypertrophy. Left predominant neural foraminal narrowing at C5-6. Upper chest: Airway is patent.  Lung apices are clear. Other: Reconstructed images demonstrate no additional findings. IMPRESSION: 1. No acute intracranial process. 2. Minimally displaced right nasal bone fracture. 3. Minimal right supraorbital soft tissue swelling. No underlying skull fracture. 4. Mild cervical spondylosis.  No acute cervical spine fracture. Electronically Signed   By: Michael  Brown M.D.   On:  07/11/2020 17:16   DG Pelvis Portable  Result Date: 07/11/2020 CLINICAL DATA:  Fell, nausea, emesis EXAM: PORTABLE PELVIS 1-2 VIEWS COMPARISON:  10/31/2009 FINDINGS: Supine frontal view of the pelvis demonstrates no fractures. Alignment is anatomic. Joint spaces are well preserved. Sacroiliac joints are normal. IMPRESSION: 1. Unremarkable bony pelvis. Electronically Signed   By: Michael  Brown M.D.   On: 07/11/2020 16:16   DG Chest Portable 1 View  Result Date: 07/11/2020 CLINICAL DATA:  Fall, possible diminished lung sounds on right EXAM: PORTABLE CHEST 1 VIEW COMPARISON:  2018 FINDINGS: Mild elevation of the right hemidiaphragm. No pleural effusion. No pneumothorax. Cardiomediastinal contours are within normal limits with normal heart size. Acute fracture of the mid right clavicle. IMPRESSION: Acute fracture of the mid right clavicle. No other acute process in the chest. Mild elevation of the right hemidiaphragm. Electronically Signed   By: Praneil  Patel M.D.   On: 07/11/2020 16:20   CT Maxillofacial Wo Contrast  Result Date: 07/11/2020 CLINICAL DATA:  Fell, hit head, nausea and emesis EXAM: CT HEAD WITHOUT CONTRAST CT MAXILLOFACIAL WITHOUT CONTRAST CT CERVICAL SPINE WITHOUT CONTRAST TECHNIQUE: Multidetector CT imaging of the head, cervical spine, and maxillofacial structures were performed using the standard protocol without intravenous contrast. Multiplanar CT image reconstructions of the cervical spine and maxillofacial structures were also generated. COMPARISON:  07/11/2015 FINDINGS: CT HEAD FINDINGS Brain: No acute infarct or hemorrhage. Lateral ventricles and midline structures are stable, with continued benign basal ganglia calcifications noted. No acute extra-axial fluid collections. No mass effect. Vascular: No hyperdense vessel or unexpected calcification. Skull: Normal. Negative for fracture or focal lesion. Other: None. CT MAXILLOFACIAL FINDINGS Osseous: There is a minimally displaced  fracture through the right inferior aspect of the nasal bone. No other acute bony abnormalities. Orbits: Negative. No traumatic or inflammatory finding. Sinuses: Clear. Soft tissues: Minimal right supraorbital soft tissue edema. CT CERVICAL SPINE FINDINGS Alignment: Alignment is anatomic. Skull base and vertebrae: No acute fracture. No primary bone lesion or focal pathologic process. Soft tissues and spinal canal: No prevertebral fluid or swelling. No visible canal hematoma. Disc levels: Mild spondylosis at C4-5 and C5-6. Mild diffuse facet hypertrophy. Left predominant neural foraminal narrowing at C5-6. Upper chest: Airway is patent.  Lung apices are clear. Other: Reconstructed images demonstrate no additional findings. IMPRESSION:   1. No acute intracranial process. 2. Minimally displaced right nasal bone fracture. 3. Minimal right supraorbital soft tissue swelling. No underlying skull fracture. 4. Mild cervical spondylosis.  No acute cervical spine fracture. Electronically Signed   By: Michael  Brown M.D.   On: 07/11/2020 17:16    Active Problems:   GI hemorrhage    Paula Guenther, NP-C @  07/12/2020, 9:38 AM     Attending physician's note   I have taken an interval history, reviewed the chart and examined the patient. I agree with the Advanced Practitioner's note, impression and recommendations.   Patient has been gone for bleeding scan majority of the afternoon.  I just saw her when it was completed.  Brisk GI bleed-painless hematochezia.  Likely diverticular bleed.  Cannot R/O UGI bleed. Syncope  Right clavicular fracture GERD on omeprazole.  Plan: -Bleeding scan.  If pos, IR mesenteric angio/embolization (after contrast desensitization protocol). -If neg, EGD/colon tomorrow.  I have explained risks and benefits. -Trend CBC. Keep Hb>7 -IV Protonix. -D/W pt and patient's Ex (in room). -Please call if any ?/Change in clinical status.   Raj Joice Nazario, MD [place]  [facility] [phone].  Addendum-RBC scan-equivocal for very subtle ? RLQ focus. Too small to be detected by angio.  Proceed with EGD/colon tomorrow PM with Dr. Pyrtle.   

## 2020-07-12 NOTE — Progress Notes (Signed)
   07/12/20 0001  Assess: MEWS Score  Temp 98.8 F (37.1 C)  BP (!) 99/44  Pulse Rate (!) 102  Resp 17  Level of Consciousness Alert  SpO2 95 %  O2 Device Room Air  Assess: MEWS Score  MEWS Temp 0  MEWS Systolic 1  MEWS Pulse 1  MEWS RR 0  MEWS LOC 0  MEWS Score 2  MEWS Score Color Yellow  Assess: if the MEWS score is Yellow or Red  Were vital signs taken at a resting state? No  Focused Assessment No change from prior assessment  Early Detection of Sepsis Score *See Row Information* Low  MEWS guidelines implemented *See Row Information* Yes  Treat  MEWS Interventions Escalated (See documentation below)  Pain Scale 0-10  Pain Score 6  Pain Type Acute pain  Pain Location Shoulder  Pain Orientation Right  Pain Intervention(s) Cold applied;Emotional support;Repositioned  Take Vital Signs  Increase Vital Sign Frequency  Yellow: Q 2hr X 2 then Q 4hr X 2, if remains yellow, continue Q 4hrs  Escalate  MEWS: Escalate Yellow: discuss with charge nurse/RN and consider discussing with provider and RRT  Notify: Charge Nurse/RN  Name of Charge Nurse/RN Notified Debbie RN  Date Charge Nurse/RN Notified 07/12/20  Time Charge Nurse/RN Notified 0030

## 2020-07-12 NOTE — Progress Notes (Signed)
PROGRESS NOTE    Kaylee Pratt  LOV:[id number] DOB: 1955-10-05 DOA: 07/11/2020 PCP: Sheliah Hatch, MD    Brief Narrative:  64 year old female with history of osteoporosis, hyperlipidemia, hiatal hernia, GERD, depression asthma, previous history of internal hemorrhoids and bleeding presented to ER with bright red blood per rectum and syncopal episode.  Started the day of admission.  She had 2 large bloody bowel movements with some abdominal discomfort but no nausea vomiting.  She tried to get up from a sitting position immediately fell on her right side and also broke her nasal bone and right clavicle. In the emergency room skeletal survey consistent with right clavicular fracture, right nasal bone fracture. Initial blood pressure normal.  Hemoglobin 9.1 with baseline hemoglobin of 13. Admitted with lower GI bleeding with GI consultation.  11/2, rapid response called in the morning when patient went to commode had large amount of blood through the rectum, she had another syncopal episode with orthostatic drop in blood pressure.  Repeat hemoglobin 7.  Resuscitated and transfusion started. Called and updated to gastroenterology.  Assessment & Plan:   Active Problems:   GI hemorrhage  Acute GI bleeding, severe GI bleeding with symptomatic anemia and syncope: Likely diverticular bleed, however recently taking aspirin. Currently symptomatic with drop in hemoglobin. On maintenance IV fluid. Normal saline 2 L bolus resuscitation To units of PRBC transfusion now, H&H every 8 hours and more transfusion as needed. Protonix IV twice daily. Contrast dye allergy, will do nuclear medicine scan, if bleeding, will need steroid prep and CTA. Followed by GI.  Possible upper GI endoscopy after resuscitation and followed by colonoscopy once able to have good bowel prep.  Comminuted fracture of the clavicle: Conservative management.  Ortho to see.  Adequate pain medications.  Leukocytosis: Suspect  stress reaction.  No evidence of bacterial infection.  GERD: On already PPI.  Depression: Lexapro that she will continue.   DVT prophylaxis: SCDs Start: 07/11/20 2346   Code Status: Full code Family Communication: None.  I offered patient to call his family.  She stated that she is in touch with her ex-husband and he is aware about everything going on. Disposition Plan: Status is: Inpatient  Remains inpatient appropriate because:Inpatient level of care appropriate due to severity of illness   Dispo: The patient is from: Home              Anticipated d/c is to: Home              Anticipated d/c date is: 3 days              Patient currently is not medically stable to d/c.         Consultants:   Gastroenterology  Procedures:   None  Antimicrobials:   None   Subjective: Patient seen and examined.  Went to see patient along with rapid response team early morning when she had a syncopal episode sitting in a commode.  Patient herself feels very weak and tired.  Minimal abdominal discomfort but very much anxious.  Denies any nausea vomiting.  Objective: Vitals:   07/12/20 0344 07/12/20 0600 07/12/20 0807 07/12/20 0830  BP: (!) 110/49 (!) 105/54 (!) 115/55 (!) 123/56  Pulse: 76 79 88   Resp: (!) 21 20 15    Temp: 98.8 F (37.1 C)  98.9 F (37.2 C)   TempSrc:   Oral   SpO2: 98%  98%   Weight:      Height:  Intake/Output Summary (Last 24 hours) at 07/12/2020 1102 Last data filed at 07/12/2020 0500 Gross per 24 hour  Intake 386.12 ml  Output --  Net 386.12 ml   Filed Weights   07/11/20 1456  Weight: 58.5 kg    Examination:  General exam: Sick looking.  Pale.  Trying to resuscitate with fluid and blood transfusion. Patient was slightly anxious. Respiratory system: Clear to auscultation. Respiratory effort normal. Cardiovascular system: S1 & S2 heard, RRR. No JVD, murmurs, rubs, gallops or clicks. No pedal edema. Gastrointestinal system: Abdomen is  nondistended, soft and nontender. No organomegaly or masses felt. Normal bowel sounds heard. Visible large amount of rectal bleed, about 300 mL in the bedside commode. Central nervous system: Alert and oriented. No focal neurological deficits. Extremities: Symmetric 5 x 5 power. Skin: No rashes, lesions or ulcers Psychiatry: Judgement and insight appear normal. Mood & affect anxious.    Data Reviewed: I have personally reviewed following labs and imaging studies  CBC: Recent Labs  Lab 07/11/20 1500 07/11/20 2115 07/12/20 0343 07/12/20 0854  WBC 12.6* 10.0  --  6.9  NEUTROABS 10.2*  --   --  4.5  HGB 11.3* 9.1* 9.0* 7.1*  HCT 36.3 28.2* 29.2* 22.1*  MCV 93.8 92.2  --  95.3  PLT 304 248  --  214   Basic Metabolic Panel: Recent Labs  Lab 07/11/20 1500 07/12/20 0343  NA 136 137  K 4.5 4.3  CL 104 105  CO2 21* 22  GLUCOSE 177* 103*  BUN 22 14  CREATININE 0.77 0.65  CALCIUM 8.5* 8.3*  MG  --  1.9   GFR: Estimated Creatinine Clearance: 57.6 mL/min (by C-G formula based on SCr of 0.65 mg/dL). Liver Function Tests: Recent Labs  Lab 07/11/20 1500 07/12/20 0343  AST 30 20  ALT 32 25  ALKPHOS 58 45  BILITOT 0.9 0.8  PROT 7.3 5.9*  ALBUMIN 3.9 3.2*   No results for input(s): LIPASE, AMYLASE in the last 168 hours. Recent Labs  Lab 07/11/20 1606  AMMONIA 27   Coagulation Profile: Recent Labs  Lab 07/12/20 0343  INR 1.1   Cardiac Enzymes: Recent Labs  Lab 07/11/20 1606  CKTOTAL 53   BNP (last 3 results) No results for input(s): PROBNP in the last 8760 hours. HbA1C: No results for input(s): HGBA1C in the last 72 hours. CBG: Recent Labs  Lab 07/11/20 1505  GLUCAP 165*   Lipid Profile: No results for input(s): CHOL, HDL, LDLCALC, TRIG, CHOLHDL, LDLDIRECT in the last 72 hours. Thyroid Function Tests: No results for input(s): TSH, T4TOTAL, FREET4, T3FREE, THYROIDAB in the last 72 hours. Anemia Panel: No results for input(s): VITAMINB12, FOLATE,  FERRITIN, TIBC, IRON, RETICCTPCT in the last 72 hours. Sepsis Labs: No results for input(s): PROCALCITON, LATICACIDVEN in the last 168 hours.  Recent Results (from the past 240 hour(s))  Respiratory Panel by RT PCR (Flu A&B, Covid) - Nasopharyngeal Swab     Status: None   Collection Time: 07/11/20  4:06 PM   Specimen: Nasopharyngeal Swab  Result Value Ref Range Status   SARS Coronavirus 2 by RT PCR NEGATIVE NEGATIVE Final    Comment: (NOTE) SARS-CoV-2 target nucleic acids are NOT DETECTED.  The SARS-CoV-2 RNA is generally detectable in upper respiratoy specimens during the acute phase of infection. The lowest concentration of SARS-CoV-2 viral copies this assay can detect is 131 copies/mL. A negative result does not preclude SARS-Cov-2 infection and should not be used as the sole basis for treatment  or other patient management decisions. A negative result may occur with  improper specimen collection/handling, submission of specimen other than nasopharyngeal swab, presence of viral mutation(s) within the areas targeted by this assay, and inadequate number of viral copies (<131 copies/mL). A negative result must be combined with clinical observations, patient history, and epidemiological information. The expected result is Negative.  Fact Sheet for Patients:  https://www.moore.com/  Fact Sheet for Healthcare Providers:  https://www.young.biz/  This test is no t yet approved or cleared by the [place] FDA and  has been authorized for detection and/or diagnosis of SARS-CoV-2 by FDA under an Emergency Use Authorization (EUA). This EUA will remain  in effect (meaning this test can be used) for the duration of the COVID-19 declaration under Section 564(b)(1) of the Act, 21 U.S.C. section 360bbb-3(b)(1), unless the authorization is terminated or revoked sooner.     Influenza A by PCR NEGATIVE NEGATIVE Final   Influenza B by PCR NEGATIVE  NEGATIVE Final    Comment: (NOTE) The Xpert Xpress SARS-CoV-2/FLU/RSV assay is intended as an aid in  the diagnosis of influenza from Nasopharyngeal swab specimens and  should not be used as a sole basis for treatment. Nasal washings and  aspirates are unacceptable for Xpert Xpress SARS-CoV-2/FLU/RSV  testing.  Fact Sheet for Patients: https://www.moore.com/  Fact Sheet for Healthcare Providers: https://www.young.biz/  This test is not yet approved or cleared by the [place] FDA and  has been authorized for detection and/or diagnosis of SARS-CoV-2 by  FDA under an Emergency Use Authorization (EUA). This EUA will remain  in effect (meaning this test can be used) for the duration of the  Covid-19 declaration under Section 564(b)(1) of the Act, 21  U.S.C. section 360bbb-3(b)(1), unless the authorization is  terminated or revoked. Performed at [facility], [address] [place], [place] [address]          Radiology Studies: CT ABDOMEN PELVIS WO CONTRAST  Result Date: 07/11/2020 CLINICAL DATA:  Generalized abdominal pain following fall, initial encounter EXAM: CT CHEST, ABDOMEN AND PELVIS WITHOUT CONTRAST TECHNIQUE: Multidetector CT imaging of the chest, abdomen and pelvis was performed following the standard protocol without IV contrast. COMPARISON:  Chest x-ray from earlier in the same day. FINDINGS: CT CHEST FINDINGS Cardiovascular: Somewhat limited due to lack of IV contrast. Atherosclerotic calcifications are noted. No cardiac enlargement is seen. Mild coronary calcifications are noted. Mediastinum/Nodes: Thoracic inlet is within normal limits. No sizable hilar or mediastinal adenopathy is noted. The esophagus as visualized is within normal limits. Lungs/Pleura: The lungs are well aerated bilaterally. Minimal bibasilar atelectatic changes are seen. No focal confluent infiltrate is noted. No sizable effusion is seen. No  parenchymal nodule is noted. Musculoskeletal: Comminuted right clavicular fracture is noted similar to that seen on prior plain film examination. Angulated fractures of the right second, third and fourth ribs anteriorly are seen. No displacement is noted and these are likely chronic in nature. No compression deformities are seen. No sternal fracture is noted. CT ABDOMEN PELVIS FINDINGS Hepatobiliary: Liver is well visualized within normal limits. Gallbladder is well distended. Tiny dependent gallstone is noted. Pancreas: Unremarkable. No pancreatic ductal dilatation or surrounding inflammatory changes. Spleen: Normal in size without focal abnormality. Adrenals/Urinary Tract: Adrenal glands are within normal limits. Kidneys show no renal calculi or obstructive changes. Ureters are within normal limits to the level of the urinary bladder. Bladder is predominately decompressed. Stomach/Bowel: Colon is predominately decompressed. Mild diverticular changes noted without evidence of diverticulitis. The appendix  is unremarkable. Small bowel and stomach are within normal limits with the exception of a large duodenal diverticulum adjacent to the head of the pancreas. Vascular/Lymphatic: No significant vascular findings are present. No enlarged abdominal or pelvic lymph nodes. Reproductive: Uterus and bilateral adnexa are unremarkable. Other: No abdominal wall hernia or abnormality. No abdominopelvic ascites. Musculoskeletal: No acute or significant osseous findings. IMPRESSION: Comminuted right clavicular fracture similar to that seen on prior plain film examination. Chronic appearing fractures of the right second, third and fourth ribs as described. Single gallstone. Diverticulosis without diverticulitis. Electronically Signed   By: Alcide Clever M.D.   On: 07/11/2020 17:19   DG Clavicle Right  Result Date: 07/11/2020 CLINICAL DATA:  Larey Seat, deformity EXAM: RIGHT CLAVICLE - 2+ VIEWS COMPARISON:  07/11/2020, 07/22/2017  FINDINGS: Frontal and lordotic views of the right clavicle demonstrate a comminuted displaced mid right clavicular fracture with 1 shaft width inferior displacement of the lateral fracture fragment. Acromioclavicular and glenohumeral joints are well aligned. The right chest is clear. IMPRESSION: 1. Comminuted displaced mid right clavicular fracture. Electronically Signed   By: Sharlet Salina M.D.   On: 07/11/2020 16:18   CT Head Wo Contrast  Result Date: 07/11/2020 CLINICAL DATA:  Larey Seat, hit head, nausea and emesis EXAM: CT HEAD WITHOUT CONTRAST CT MAXILLOFACIAL WITHOUT CONTRAST CT CERVICAL SPINE WITHOUT CONTRAST TECHNIQUE: Multidetector CT imaging of the head, cervical spine, and maxillofacial structures were performed using the standard protocol without intravenous contrast. Multiplanar CT image reconstructions of the cervical spine and maxillofacial structures were also generated. COMPARISON:  07/11/2015 FINDINGS: CT HEAD FINDINGS Brain: No acute infarct or hemorrhage. Lateral ventricles and midline structures are stable, with continued benign basal ganglia calcifications noted. No acute extra-axial fluid collections. No mass effect. Vascular: No hyperdense vessel or unexpected calcification. Skull: Normal. Negative for fracture or focal lesion. Other: None. CT MAXILLOFACIAL FINDINGS Osseous: There is a minimally displaced fracture through the right inferior aspect of the nasal bone. No other acute bony abnormalities. Orbits: Negative. No traumatic or inflammatory finding. Sinuses: Clear. Soft tissues: Minimal right supraorbital soft tissue edema. CT CERVICAL SPINE FINDINGS Alignment: Alignment is anatomic. Skull base and vertebrae: No acute fracture. No primary bone lesion or focal pathologic process. Soft tissues and spinal canal: No prevertebral fluid or swelling. No visible canal hematoma. Disc levels: Mild spondylosis at C4-5 and C5-6. Mild diffuse facet hypertrophy. Left predominant neural foraminal  narrowing at C5-6. Upper chest: Airway is patent.  Lung apices are clear. Other: Reconstructed images demonstrate no additional findings. IMPRESSION: 1. No acute intracranial process. 2. Minimally displaced right nasal bone fracture. 3. Minimal right supraorbital soft tissue swelling. No underlying skull fracture. 4. Mild cervical spondylosis.  No acute cervical spine fracture. Electronically Signed   By: Sharlet Salina M.D.   On: 07/11/2020 17:16   CT Chest Wo Contrast  Result Date: 07/11/2020 CLINICAL DATA:  Generalized abdominal pain following fall, initial encounter EXAM: CT CHEST, ABDOMEN AND PELVIS WITHOUT CONTRAST TECHNIQUE: Multidetector CT imaging of the chest, abdomen and pelvis was performed following the standard protocol without IV contrast. COMPARISON:  Chest x-ray from earlier in the same day. FINDINGS: CT CHEST FINDINGS Cardiovascular: Somewhat limited due to lack of IV contrast. Atherosclerotic calcifications are noted. No cardiac enlargement is seen. Mild coronary calcifications are noted. Mediastinum/Nodes: Thoracic inlet is within normal limits. No sizable hilar or mediastinal adenopathy is noted. The esophagus as visualized is within normal limits. Lungs/Pleura: The lungs are well aerated bilaterally. Minimal bibasilar atelectatic changes are seen. No  focal confluent infiltrate is noted. No sizable effusion is seen. No parenchymal nodule is noted. Musculoskeletal: Comminuted right clavicular fracture is noted similar to that seen on prior plain film examination. Angulated fractures of the right second, third and fourth ribs anteriorly are seen. No displacement is noted and these are likely chronic in nature. No compression deformities are seen. No sternal fracture is noted. CT ABDOMEN PELVIS FINDINGS Hepatobiliary: Liver is well visualized within normal limits. Gallbladder is well distended. Tiny dependent gallstone is noted. Pancreas: Unremarkable. No pancreatic ductal dilatation or  surrounding inflammatory changes. Spleen: Normal in size without focal abnormality. Adrenals/Urinary Tract: Adrenal glands are within normal limits. Kidneys show no renal calculi or obstructive changes. Ureters are within normal limits to the level of the urinary bladder. Bladder is predominately decompressed. Stomach/Bowel: Colon is predominately decompressed. Mild diverticular changes noted without evidence of diverticulitis. The appendix is unremarkable. Small bowel and stomach are within normal limits with the exception of a large duodenal diverticulum adjacent to the head of the pancreas. Vascular/Lymphatic: No significant vascular findings are present. No enlarged abdominal or pelvic lymph nodes. Reproductive: Uterus and bilateral adnexa are unremarkable. Other: No abdominal wall hernia or abnormality. No abdominopelvic ascites. Musculoskeletal: No acute or significant osseous findings. IMPRESSION: Comminuted right clavicular fracture similar to that seen on prior plain film examination. Chronic appearing fractures of the right second, third and fourth ribs as described. Single gallstone. Diverticulosis without diverticulitis. Electronically Signed   By: Alcide Clever M.D.   On: 07/11/2020 17:19   CT Cervical Spine Wo Contrast  Result Date: 07/11/2020 CLINICAL DATA:  Larey Seat, hit head, nausea and emesis EXAM: CT HEAD WITHOUT CONTRAST CT MAXILLOFACIAL WITHOUT CONTRAST CT CERVICAL SPINE WITHOUT CONTRAST TECHNIQUE: Multidetector CT imaging of the head, cervical spine, and maxillofacial structures were performed using the standard protocol without intravenous contrast. Multiplanar CT image reconstructions of the cervical spine and maxillofacial structures were also generated. COMPARISON:  07/11/2015 FINDINGS: CT HEAD FINDINGS Brain: No acute infarct or hemorrhage. Lateral ventricles and midline structures are stable, with continued benign basal ganglia calcifications noted. No acute extra-axial fluid collections.  No mass effect. Vascular: No hyperdense vessel or unexpected calcification. Skull: Normal. Negative for fracture or focal lesion. Other: None. CT MAXILLOFACIAL FINDINGS Osseous: There is a minimally displaced fracture through the right inferior aspect of the nasal bone. No other acute bony abnormalities. Orbits: Negative. No traumatic or inflammatory finding. Sinuses: Clear. Soft tissues: Minimal right supraorbital soft tissue edema. CT CERVICAL SPINE FINDINGS Alignment: Alignment is anatomic. Skull base and vertebrae: No acute fracture. No primary bone lesion or focal pathologic process. Soft tissues and spinal canal: No prevertebral fluid or swelling. No visible canal hematoma. Disc levels: Mild spondylosis at C4-5 and C5-6. Mild diffuse facet hypertrophy. Left predominant neural foraminal narrowing at C5-6. Upper chest: Airway is patent.  Lung apices are clear. Other: Reconstructed images demonstrate no additional findings. IMPRESSION: 1. No acute intracranial process. 2. Minimally displaced right nasal bone fracture. 3. Minimal right supraorbital soft tissue swelling. No underlying skull fracture. 4. Mild cervical spondylosis.  No acute cervical spine fracture. Electronically Signed   By: Sharlet Salina M.D.   On: 07/11/2020 17:16   DG Pelvis Portable  Result Date: 07/11/2020 CLINICAL DATA:  Larey Seat, nausea, emesis EXAM: PORTABLE PELVIS 1-2 VIEWS COMPARISON:  10/31/2009 FINDINGS: Supine frontal view of the pelvis demonstrates no fractures. Alignment is anatomic. Joint spaces are well preserved. Sacroiliac joints are normal. IMPRESSION: 1. Unremarkable bony pelvis. Electronically Signed   By: Casimiro Needle  Manson Passey M.D.   On: 07/11/2020 16:16   DG Chest Portable 1 View  Result Date: 07/11/2020 CLINICAL DATA:  Fall, possible diminished lung sounds on right EXAM: PORTABLE CHEST 1 VIEW COMPARISON:  2018 FINDINGS: Mild elevation of the right hemidiaphragm. No pleural effusion. No pneumothorax. Cardiomediastinal contours  are within normal limits with normal heart size. Acute fracture of the mid right clavicle. IMPRESSION: Acute fracture of the mid right clavicle. No other acute process in the chest. Mild elevation of the right hemidiaphragm. Electronically Signed   By: Guadlupe Spanish M.D.   On: 07/11/2020 16:20   CT Maxillofacial Wo Contrast  Result Date: 07/11/2020 CLINICAL DATA:  Larey Seat, hit head, nausea and emesis EXAM: CT HEAD WITHOUT CONTRAST CT MAXILLOFACIAL WITHOUT CONTRAST CT CERVICAL SPINE WITHOUT CONTRAST TECHNIQUE: Multidetector CT imaging of the head, cervical spine, and maxillofacial structures were performed using the standard protocol without intravenous contrast. Multiplanar CT image reconstructions of the cervical spine and maxillofacial structures were also generated. COMPARISON:  07/11/2015 FINDINGS: CT HEAD FINDINGS Brain: No acute infarct or hemorrhage. Lateral ventricles and midline structures are stable, with continued benign basal ganglia calcifications noted. No acute extra-axial fluid collections. No mass effect. Vascular: No hyperdense vessel or unexpected calcification. Skull: Normal. Negative for fracture or focal lesion. Other: None. CT MAXILLOFACIAL FINDINGS Osseous: There is a minimally displaced fracture through the right inferior aspect of the nasal bone. No other acute bony abnormalities. Orbits: Negative. No traumatic or inflammatory finding. Sinuses: Clear. Soft tissues: Minimal right supraorbital soft tissue edema. CT CERVICAL SPINE FINDINGS Alignment: Alignment is anatomic. Skull base and vertebrae: No acute fracture. No primary bone lesion or focal pathologic process. Soft tissues and spinal canal: No prevertebral fluid or swelling. No visible canal hematoma. Disc levels: Mild spondylosis at C4-5 and C5-6. Mild diffuse facet hypertrophy. Left predominant neural foraminal narrowing at C5-6. Upper chest: Airway is patent.  Lung apices are clear. Other: Reconstructed images demonstrate no  additional findings. IMPRESSION: 1. No acute intracranial process. 2. Minimally displaced right nasal bone fracture. 3. Minimal right supraorbital soft tissue swelling. No underlying skull fracture. 4. Mild cervical spondylosis.  No acute cervical spine fracture. Electronically Signed   By: Sharlet Salina M.D.   On: 07/11/2020 17:16        Scheduled Meds: . sodium chloride   Intravenous Once  . sodium chloride   Intravenous Once  . clonazePAM  1 mg Oral QHS  . escitalopram  10 mg Oral QHS  . montelukast  10 mg Oral QHS  . [START ON 07/15/2020] pantoprazole  40 mg Intravenous Q12H  . [START ON 07/14/2020] simvastatin  10 mg Oral Once per day on Mon Thu   Continuous Infusions: . sodium chloride 100 mL/hr at 07/12/20 0102  . methocarbamol (ROBAXIN) IV    . pantoprozole (PROTONIX) infusion       LOS: 1 day    Time spent: 45 minutes    Dorcas Carrow, MD [facility] Pager [phone]

## 2020-07-12 NOTE — Progress Notes (Signed)
Called to room by [facility] who had assisted patient to bedside commode.  Upon sitting on the commode the patient fainted.  Patient assisted back to bed with assist of 4 where she briefly failed to respond.  After sternal rub and vocal commands to open her eyes patient did so and was able to state her name, the day of the week and where she was.  Large amount of bright red blood with clots noted in bedside commode.  Rapid response RN and Dr. Jerral Ralph in room.  Orders placed for fluid bolus x2 and a unit of prbc.  Patient remains lethargic but oriented.  Will continue to monitor closely

## 2020-07-12 NOTE — Significant Event (Signed)
Rapid Response Event Note   Reason for Call :  Called to bedside for a hypotensive event after a large bloody BM on BSC.   Initial Focused Assessment:  Neuro: Alert and oriented x4, but slightly drowsy Resp: 100% on RA  GI: Large bloody BM in [facility] Cards: NSR in the 80s BP 135/86   Interventions:  Fluid bolus given per protocol, and then MD ordered for a full 2L to be given  STAT CBC ordered. Resulted with a Hgb drop from 9 to 7  Plan of Care:  MD at bedside. MD ordered 1 unit PRBCs, fluid, and further diagnostic scans    Event Summary:   MD Notified: Dr. Jerral Ralph Call Time: 0813 Arrival Time: 0815 End Time: 0600  Alveria Apley, RN

## 2020-07-12 NOTE — Consult Note (Addendum)
Referring Provider:  [facility]         Primary Care Physician:  Sheliah Hatch, MD Primary Gastroenterologist:  Previously Sheryn Bison, MD        We were asked to see this patient for:    GI bleed              ASSESSMENT / PLAN:    # GI bleed with acute blood loss anemia and syncope. At this point it isn't clear if bleed is lower vrs a brisk upper bleed. Even though BUN normal she endorses recent upper abdominal pain, black stool yesterday ( in addition to bright red blood) and she recently started taking aspirin.   --Nuclear bleeding scan has already been ordered. If positive then will need CT angiogram. However patient has IV contrast allergy so would first need Benadryl / prednisone protocol prior to CTA. If bleeding scan negative then will need EGD --Keep NPO --Blood transfusion has already been ordered.  --Trend hgb.   # Right clavicular fracture. Minimally displaced right nasal bone fracture post fall   # Cholelithiasis, seems to be asymptomatic at this point.    # GERD, on Prilosec     HPI:                                                                                                                             Chief Complaint: GI bleed  Kaylee Pratt is a 64 y.o. female with a pmh significant for, not necessarily limited to: GERD, hyperlipidemia, osteoporosis, GAD, asthma, diverticulosis, cholelithiasis  Patient presented to ED yesterday with nausea, vomiting, multiple episodes of LOC. Unconscious when EMS arrived. Patent recalls having a couple of episodes of rectal bleeding prior to syncopal episode. Last week she started having diffuse upper abdominal pain. Pain was constant, not related to eating. She doesn't know if emesis yesterday was dark but she does recall having two episodes of black stool yesterday prior to passing bright red blood. Patient started taking a baby asa about 2 weeks ago. Denies other NSAID use. No bismuth or oral iron intake. Hgb down  from 13.7 to 11.3 in ED yesterday. BUN normal. Her vital signs were relatively stable. Non-contrast CT scan abd / pelvis negative for acute abnormalities except for right clavicular fracture  This am patient had another episode of hematochezia and syncopal episode in hospital bathroom. Rapid response called, IVF ordered. Stat bleeding scan ordered. Her hgb has continued to decline, down to 7.1 this am.    PREVIOUS ENDOSCOPIC EVALUATIONS / PERTINENT STUDIES    EGD 2014 for GERD and dysphagia --4 cm hiatal hernia  Screening colonoscopy 2014 --normal except for hemorrhoids.    Past Medical History:  Diagnosis Date  . Allergy   . Asthma   . Depression   . Duodenal diverticulum   . GERD (gastroesophageal reflux disease)   . GERD (gastroesophageal reflux disease)   . Hiatal hernia   . Hyperlipidemia   .  Osteoporosis     Past Surgical History:  Procedure Laterality Date  . TUBAL LIGATION      Prior to Admission medications   Medication Sig Start Date End Date Taking? Authorizing Provider  albuterol (VENTOLIN HFA) 108 (90 Base) MCG/ACT inhaler Inhale 2 puffs into the lungs every 6 (six) hours as needed for wheezing or shortness of breath. 05/13/20  Yes Sheliah Hatch, MD  aspirin EC 81 MG tablet Take 162 mg by mouth daily.    Yes [provider]  cholecalciferol (VITAMIN D) 1000 UNITS tablet Take 2,000 Units by mouth daily.   Yes [provider]  clonazePAM (KLONOPIN) 1 MG tablet 1 tab po BID prn anxiety Patient taking differently: Take 1 mg by mouth at bedtime.  05/13/20  Yes Sheliah Hatch, MD  escitalopram (LEXAPRO) 20 MG tablet TAKE 1 TABLET BY MOUTH EVERY DAY Patient taking differently: Take 10 mg by mouth at bedtime.  01/05/20  Yes Sheliah Hatch, MD  omeprazole (PRILOSEC) 40 MG capsule Take 1 capsule (40 mg total) by mouth daily. Patient taking differently: Take 40 mg by mouth daily as needed (indigestion).  05/13/20  Yes Sheliah Hatch, MD    simvastatin (ZOCOR) 20 MG tablet TAKE 1 TABLET BY MOUTH EVERYDAY AT BEDTIME Patient taking differently: Take 10 mg by mouth 2 (two) times a week.  05/13/20  Yes Sheliah Hatch, MD  montelukast (SINGULAIR) 10 MG tablet Take 1 tablet (10 mg total) by mouth at bedtime. Patient not taking: Reported on 07/11/2020 10/20/18   Sheliah Hatch, MD    Current Facility-Administered Medications  Medication Dose Route Frequency Provider Last Rate Last Admin  . 0.9 %  sodium chloride infusion (Manually program via Guardrails IV Fluids)   Intravenous Once Dorcas Carrow, MD      . 0.9 %  sodium chloride infusion   Intravenous Continuous Zierle-Ghosh, Asia B, DO 100 mL/hr at 07/12/20 0102 New Bag at 07/12/20 0102  . acetaminophen (TYLENOL) tablet 650 mg  650 mg Oral Q6H PRN Zierle-Ghosh, Asia B, DO   650 mg at 07/12/20 1610   Or  . acetaminophen (TYLENOL) suppository 650 mg  650 mg Rectal Q6H PRN Zierle-Ghosh, Asia B, DO      . albuterol (VENTOLIN HFA) 108 (90 Base) MCG/ACT inhaler 2 puff  2 puff Inhalation Q6H PRN Zierle-Ghosh, Asia B, DO      . clonazePAM (KLONOPIN) tablet 1 mg  1 mg Oral QHS Zierle-Ghosh, Asia B, DO      . escitalopram (LEXAPRO) tablet 10 mg  10 mg Oral QHS Zierle-Ghosh, Asia B, DO      . methocarbamol (ROBAXIN) 500 mg in dextrose 5 % 50 mL IVPB  500 mg Intravenous Q6H PRN Zierle-Ghosh, Asia B, DO      . montelukast (SINGULAIR) tablet 10 mg  10 mg Oral QHS Zierle-Ghosh, Asia B, DO   10 mg at 07/12/20 0104  . morphine 2 MG/ML injection 2 mg  2 mg Intravenous Q2H PRN Zierle-Ghosh, Asia B, DO      . ondansetron (ZOFRAN) tablet 4 mg  4 mg Oral Q6H PRN Zierle-Ghosh, Asia B, DO       Or  . ondansetron (ZOFRAN) injection 4 mg  4 mg Intravenous Q6H PRN Zierle-Ghosh, Asia B, DO      . pantoprazole (PROTONIX) injection 40 mg  40 mg Intravenous Q12H Zierle-Ghosh, Asia B, DO   40 mg at 07/12/20 0104  . [START ON 07/14/2020] simvastatin (ZOCOR) tablet 10  mg  10 mg Oral Once per day on Mon Thu  Zierle-Ghosh, [place] B, DO      . sodium chloride 0.9 % bolus 1,000 mL  1,000 mL Intravenous Once Dorcas CarrowGhimire, Kuber, MD        Allergies as of 07/11/2020 - Review Complete 07/11/2020  Allergen Reaction Noted  . Amoxil [amoxicillin] Rash 07/21/2015  . Iohexol Hives 06/10/2015    Family History  Problem Relation Age of Onset  . Prostate cancer Father   . Other Neg Hx        no early CVA/CAD, no cancer  . Sudden death Neg Hx   . Diabetes Neg Hx   . Heart attack Neg Hx   . Hyperlipidemia Neg Hx   . Hypertension Neg Hx     Social History   Socioeconomic History  . Marital status: Married    Spouse name: Not on file  . Number of children: 2  . Years of education: Not on file  . Highest education level: Not on file  Occupational History    Employer: [employer]  Tobacco Use  . Smoking status: Never Smoker  . Smokeless tobacco: Never Used  Vaping Use  . Vaping Use: Never used  Substance and Sexual Activity  . Alcohol use: Yes    Alcohol/week: 2.0 standard drinks    Types: 2 Glasses of wine per week    Comment: wine on occasion  . Drug use: No  . Sexual activity: Not on file  Other Topics Concern  . Not on file  Social History Narrative   Occupation: Sr Artistrogram Analyst   Married   Will 23     [name] 19      Caffeine Use:  Rarely   Regular exercise:  4 x weekly            Social Determinants of Corporate investment bankerHealth   Financial Resource Strain:   . Difficulty of Paying Living Expenses: Not on file  Food Insecurity:   . Worried About Programme researcher, broadcasting/film/videounning Out of Food in the Last Year: Not on file  . Ran Out of Food in the Last Year: Not on file  Transportation Needs:   . Lack of Transportation (Medical): Not on file  . Lack of Transportation (Non-Medical): Not on file  Physical Activity:   . Days of Exercise per Week: Not on file  . Minutes of Exercise per Session: Not on file  Stress:   . Feeling of Stress : Not on file  Social Connections:   . Frequency of Communication with  Friends and Family: Not on file  . Frequency of Social Gatherings with Friends and Family: Not on file  . Attends Religious Services: Not on file  . Active Member of Clubs or Organizations: Not on file  . Attends BankerClub or Organization Meetings: Not on file  . Marital Status: Not on file  Intimate Partner Violence:   . Fear of Current or Ex-Partner: Not on file  . Emotionally Abused: Not on file  . Physically Abused: Not on file  . Sexually Abused: Not on file    Review of Systems: All systems reviewed and negative except where noted in HPI.  OBJECTIVE:    Physical Exam: Vital signs in last 24 hours: Temp:  [98.1 F (36.7 C)-98.9 F (37.2 C)] 98.9 F (37.2 C) (11/02 0807) Pulse Rate:  [73-102] 88 (11/02 0807) Resp:  [15-21] 15 (11/02 0807) BP: (99-143)/(44-105) 123/56 (11/02 0830) SpO2:  [94 %-100 %] 98 % (11/02 0807)  Weight:  [58.5 kg] 58.5 kg (11/01 1456) Last BM Date: 07/11/20 General:   Mild weakness , well-developed,  Asian female in NAD Psych:  Pleasant, cooperative.  Eyes:  Pupils equal, sclera clear, no icterus.   Conjunctiva pale. Ears:  Normal auditory acuity. Nose:  No deformity, discharge,  or lesions. Neck:  Supple; no masses Lungs:  Clear throughout to auscultation.   No wheezes, crackles, or rhonchi.  Heart:  Regular rate and rhythm; no murmurs, no lower extremity edema Abdomen:  Soft, non-distended, minimal RUQ tenderness, BS active, no palp mass   Rectal:  Deferred  Msk:  Symmetrical without gross deformities. . Neurologic:   oriented x4;  grossly normal neurologically. Skin:  Intact without significant lesions or rashes.  Filed Weights   07/11/20 1456  Weight: 58.5 kg     Scheduled inpatient medications . sodium chloride   Intravenous Once  . clonazePAM  1 mg Oral QHS  . escitalopram  10 mg Oral QHS  . montelukast  10 mg Oral QHS  . pantoprazole (PROTONIX) IV  40 mg Intravenous Q12H  . [START ON 07/14/2020] simvastatin  10 mg Oral Once per day  on Mon Thu      Intake/Output from previous day: 11/01 0701 - 11/02 0700 In: 386.1 [I.V.:386.1] Out: -  Intake/Output this shift: No intake/output data recorded.   Lab Results: Recent Labs    07/11/20 1500 07/11/20 1500 07/11/20 2115 07/12/20 0343 07/12/20 0854  WBC 12.6*  --  10.0  --  6.9  HGB 11.3*   < > 9.1* 9.0* 7.1*  HCT 36.3   < > 28.2* 29.2* 22.1*  PLT 304  --  248  --  214   < > = values in this interval not displayed.   BMET Recent Labs    07/11/20 1500 07/12/20 0343  NA 136 137  K 4.5 4.3  CL 104 105  CO2 21* 22  GLUCOSE 177* 103*  BUN 22 14  CREATININE 0.77 0.65  CALCIUM 8.5* 8.3*   LFT Recent Labs    07/12/20 0343  PROT 5.9*  ALBUMIN 3.2*  AST 20  ALT 25  ALKPHOS 45  BILITOT 0.8   PT/INR Recent Labs    07/12/20 0343  LABPROT 13.6  INR 1.1   Hepatitis Panel No results for input(s): HEPBSAG, HCVAB, HEPAIGM, HEPBIGM in the last 72 hours.   . CBC Latest Ref Rng & Units 07/12/2020 07/12/2020 07/11/2020  WBC 4.0 - 10.5 K/uL 6.9 - 10.0  Hemoglobin 12.0 - 15.0 g/dL 7.1(L) 9.0(L) 9.1(L)  Hematocrit 36 - 46 % 22.1(L) 29.2(L) 28.2(L)  Platelets 150 - 400 K/uL 214 - 248    . CMP Latest Ref Rng & Units 07/12/2020 07/11/2020 05/13/2020  Glucose 70 - 99 mg/dL 960(A) 540(J) 93  BUN 8 - 23 mg/dL Creatinine 0.44 - 1.00 mg/dL 8.11 9.14 7.82  Sodium 135 - 145 mmol/L 137 136 140  Potassium 3.5 - 5.1 mmol/L 4.3 4.5 4.7  Chloride 98 - 111 mmol/L 105 104 105  CO2 22 - 32 mmol/L 22 21(L) 27  Calcium 8.9 - 10.3 mg/dL 8.3(L) 8.5(L) 9.4  Total Protein 6.5 - 8.1 g/dL 5.9(L) 7.3 7.1  Total Bilirubin 0.3 - 1.2 mg/dL 0.8 0.9 0.5  Alkaline Phos 38 - 126 U/L 45 58 68  AST 15 - 41 U/L ALT 0 - 44 U/L 25 32 28   Studies/Results: CT ABDOMEN PELVIS WO CONTRAST  Result Date: 07/11/2020  CLINICAL DATA:  Generalized abdominal pain following fall, initial encounter EXAM: CT CHEST, ABDOMEN AND PELVIS WITHOUT CONTRAST TECHNIQUE: Multidetector CT  imaging of the chest, abdomen and pelvis was performed following the standard protocol without IV contrast. COMPARISON:  Chest x-ray from earlier in the same day. FINDINGS: CT CHEST FINDINGS Cardiovascular: Somewhat limited due to lack of IV contrast. Atherosclerotic calcifications are noted. No cardiac enlargement is seen. Mild coronary calcifications are noted. Mediastinum/Nodes: Thoracic inlet is within normal limits. No sizable hilar or mediastinal adenopathy is noted. The esophagus as visualized is within normal limits. Lungs/Pleura: The lungs are well aerated bilaterally. Minimal bibasilar atelectatic changes are seen. No focal confluent infiltrate is noted. No sizable effusion is seen. No parenchymal nodule is noted. Musculoskeletal: Comminuted right clavicular fracture is noted similar to that seen on prior plain film examination. Angulated fractures of the right second, third and fourth ribs anteriorly are seen. No displacement is noted and these are likely chronic in nature. No compression deformities are seen. No sternal fracture is noted. CT ABDOMEN PELVIS FINDINGS Hepatobiliary: Liver is well visualized within normal limits. Gallbladder is well distended. Tiny dependent gallstone is noted. Pancreas: Unremarkable. No pancreatic ductal dilatation or surrounding inflammatory changes. Spleen: Normal in size without focal abnormality. Adrenals/Urinary Tract: Adrenal glands are within normal limits. Kidneys show no renal calculi or obstructive changes. Ureters are within normal limits to the level of the urinary bladder. Bladder is predominately decompressed. Stomach/Bowel: Colon is predominately decompressed. Mild diverticular changes noted without evidence of diverticulitis. The appendix is unremarkable. Small bowel and stomach are within normal limits with the exception of a large duodenal diverticulum adjacent to the head of the pancreas. Vascular/Lymphatic: No significant vascular findings are present.  No enlarged abdominal or pelvic lymph nodes. Reproductive: Uterus and bilateral adnexa are unremarkable. Other: No abdominal wall hernia or abnormality. No abdominopelvic ascites. Musculoskeletal: No acute or significant osseous findings. IMPRESSION: Comminuted right clavicular fracture similar to that seen on prior plain film examination. Chronic appearing fractures of the right second, third and fourth ribs as described. Single gallstone. Diverticulosis without diverticulitis. Electronically Signed   By: Alcide Clever M.D.   On: 07/11/2020 17:19   DG Clavicle Right  Result Date: 07/11/2020 CLINICAL DATA:  Larey Seat, deformity EXAM: RIGHT CLAVICLE - 2+ VIEWS COMPARISON:  07/11/2020, 07/22/2017 FINDINGS: Frontal and lordotic views of the right clavicle demonstrate a comminuted displaced mid right clavicular fracture with 1 shaft width inferior displacement of the lateral fracture fragment. Acromioclavicular and glenohumeral joints are well aligned. The right chest is clear. IMPRESSION: 1. Comminuted displaced mid right clavicular fracture. Electronically Signed   By: Sharlet Salina M.D.   On: 07/11/2020 16:18   CT Head Wo Contrast  Result Date: 07/11/2020 CLINICAL DATA:  Larey Seat, hit head, nausea and emesis EXAM: CT HEAD WITHOUT CONTRAST CT MAXILLOFACIAL WITHOUT CONTRAST CT CERVICAL SPINE WITHOUT CONTRAST TECHNIQUE: Multidetector CT imaging of the head, cervical spine, and maxillofacial structures were performed using the standard protocol without intravenous contrast. Multiplanar CT image reconstructions of the cervical spine and maxillofacial structures were also generated. COMPARISON:  07/11/2015 FINDINGS: CT HEAD FINDINGS Brain: No acute infarct or hemorrhage. Lateral ventricles and midline structures are stable, with continued benign basal ganglia calcifications noted. No acute extra-axial fluid collections. No mass effect. Vascular: No hyperdense vessel or unexpected calcification. Skull: Normal. Negative for  fracture or focal lesion. Other: None. CT MAXILLOFACIAL FINDINGS Osseous: There is a minimally displaced fracture through the right inferior aspect of the nasal bone. No other  acute bony abnormalities. Orbits: Negative. No traumatic or inflammatory finding. Sinuses: Clear. Soft tissues: Minimal right supraorbital soft tissue edema. CT CERVICAL SPINE FINDINGS Alignment: Alignment is anatomic. Skull base and vertebrae: No acute fracture. No primary bone lesion or focal pathologic process. Soft tissues and spinal canal: No prevertebral fluid or swelling. No visible canal hematoma. Disc levels: Mild spondylosis at C4-5 and C5-6. Mild diffuse facet hypertrophy. Left predominant neural foraminal narrowing at C5-6. Upper chest: Airway is patent.  Lung apices are clear. Other: Reconstructed images demonstrate no additional findings. IMPRESSION: 1. No acute intracranial process. 2. Minimally displaced right nasal bone fracture. 3. Minimal right supraorbital soft tissue swelling. No underlying skull fracture. 4. Mild cervical spondylosis.  No acute cervical spine fracture. Electronically Signed   By: Sharlet Salina M.D.   On: 07/11/2020 17:16   CT Chest Wo Contrast  Result Date: 07/11/2020 CLINICAL DATA:  Generalized abdominal pain following fall, initial encounter EXAM: CT CHEST, ABDOMEN AND PELVIS WITHOUT CONTRAST TECHNIQUE: Multidetector CT imaging of the chest, abdomen and pelvis was performed following the standard protocol without IV contrast. COMPARISON:  Chest x-ray from earlier in the same day. FINDINGS: CT CHEST FINDINGS Cardiovascular: Somewhat limited due to lack of IV contrast. Atherosclerotic calcifications are noted. No cardiac enlargement is seen. Mild coronary calcifications are noted. Mediastinum/Nodes: Thoracic inlet is within normal limits. No sizable hilar or mediastinal adenopathy is noted. The esophagus as visualized is within normal limits. Lungs/Pleura: The lungs are well aerated bilaterally.  Minimal bibasilar atelectatic changes are seen. No focal confluent infiltrate is noted. No sizable effusion is seen. No parenchymal nodule is noted. Musculoskeletal: Comminuted right clavicular fracture is noted similar to that seen on prior plain film examination. Angulated fractures of the right second, third and fourth ribs anteriorly are seen. No displacement is noted and these are likely chronic in nature. No compression deformities are seen. No sternal fracture is noted. CT ABDOMEN PELVIS FINDINGS Hepatobiliary: Liver is well visualized within normal limits. Gallbladder is well distended. Tiny dependent gallstone is noted. Pancreas: Unremarkable. No pancreatic ductal dilatation or surrounding inflammatory changes. Spleen: Normal in size without focal abnormality. Adrenals/Urinary Tract: Adrenal glands are within normal limits. Kidneys show no renal calculi or obstructive changes. Ureters are within normal limits to the level of the urinary bladder. Bladder is predominately decompressed. Stomach/Bowel: Colon is predominately decompressed. Mild diverticular changes noted without evidence of diverticulitis. The appendix is unremarkable. Small bowel and stomach are within normal limits with the exception of a large duodenal diverticulum adjacent to the head of the pancreas. Vascular/Lymphatic: No significant vascular findings are present. No enlarged abdominal or pelvic lymph nodes. Reproductive: Uterus and bilateral adnexa are unremarkable. Other: No abdominal wall hernia or abnormality. No abdominopelvic ascites. Musculoskeletal: No acute or significant osseous findings. IMPRESSION: Comminuted right clavicular fracture similar to that seen on prior plain film examination. Chronic appearing fractures of the right second, third and fourth ribs as described. Single gallstone. Diverticulosis without diverticulitis. Electronically Signed   By: Alcide Clever M.D.   On: 07/11/2020 17:19   CT Cervical Spine Wo  Contrast  Result Date: 07/11/2020 CLINICAL DATA:  Larey Seat, hit head, nausea and emesis EXAM: CT HEAD WITHOUT CONTRAST CT MAXILLOFACIAL WITHOUT CONTRAST CT CERVICAL SPINE WITHOUT CONTRAST TECHNIQUE: Multidetector CT imaging of the head, cervical spine, and maxillofacial structures were performed using the standard protocol without intravenous contrast. Multiplanar CT image reconstructions of the cervical spine and maxillofacial structures were also generated. COMPARISON:  07/11/2015 FINDINGS: CT HEAD FINDINGS Brain:  No acute infarct or hemorrhage. Lateral ventricles and midline structures are stable, with continued benign basal ganglia calcifications noted. No acute extra-axial fluid collections. No mass effect. Vascular: No hyperdense vessel or unexpected calcification. Skull: Normal. Negative for fracture or focal lesion. Other: None. CT MAXILLOFACIAL FINDINGS Osseous: There is a minimally displaced fracture through the right inferior aspect of the nasal bone. No other acute bony abnormalities. Orbits: Negative. No traumatic or inflammatory finding. Sinuses: Clear. Soft tissues: Minimal right supraorbital soft tissue edema. CT CERVICAL SPINE FINDINGS Alignment: Alignment is anatomic. Skull base and vertebrae: No acute fracture. No primary bone lesion or focal pathologic process. Soft tissues and spinal canal: No prevertebral fluid or swelling. No visible canal hematoma. Disc levels: Mild spondylosis at C4-5 and C5-6. Mild diffuse facet hypertrophy. Left predominant neural foraminal narrowing at C5-6. Upper chest: Airway is patent.  Lung apices are clear. Other: Reconstructed images demonstrate no additional findings. IMPRESSION: 1. No acute intracranial process. 2. Minimally displaced right nasal bone fracture. 3. Minimal right supraorbital soft tissue swelling. No underlying skull fracture. 4. Mild cervical spondylosis.  No acute cervical spine fracture. Electronically Signed   By: Sharlet Salina M.D.   On:  07/11/2020 17:16   DG Pelvis Portable  Result Date: 07/11/2020 CLINICAL DATA:  Larey Seat, nausea, emesis EXAM: PORTABLE PELVIS 1-2 VIEWS COMPARISON:  10/31/2009 FINDINGS: Supine frontal view of the pelvis demonstrates no fractures. Alignment is anatomic. Joint spaces are well preserved. Sacroiliac joints are normal. IMPRESSION: 1. Unremarkable bony pelvis. Electronically Signed   By: Sharlet Salina M.D.   On: 07/11/2020 16:16   DG Chest Portable 1 View  Result Date: 07/11/2020 CLINICAL DATA:  Fall, possible diminished lung sounds on right EXAM: PORTABLE CHEST 1 VIEW COMPARISON:  2018 FINDINGS: Mild elevation of the right hemidiaphragm. No pleural effusion. No pneumothorax. Cardiomediastinal contours are within normal limits with normal heart size. Acute fracture of the mid right clavicle. IMPRESSION: Acute fracture of the mid right clavicle. No other acute process in the chest. Mild elevation of the right hemidiaphragm. Electronically Signed   By: Guadlupe Spanish M.D.   On: 07/11/2020 16:20   CT Maxillofacial Wo Contrast  Result Date: 07/11/2020 CLINICAL DATA:  Larey Seat, hit head, nausea and emesis EXAM: CT HEAD WITHOUT CONTRAST CT MAXILLOFACIAL WITHOUT CONTRAST CT CERVICAL SPINE WITHOUT CONTRAST TECHNIQUE: Multidetector CT imaging of the head, cervical spine, and maxillofacial structures were performed using the standard protocol without intravenous contrast. Multiplanar CT image reconstructions of the cervical spine and maxillofacial structures were also generated. COMPARISON:  07/11/2015 FINDINGS: CT HEAD FINDINGS Brain: No acute infarct or hemorrhage. Lateral ventricles and midline structures are stable, with continued benign basal ganglia calcifications noted. No acute extra-axial fluid collections. No mass effect. Vascular: No hyperdense vessel or unexpected calcification. Skull: Normal. Negative for fracture or focal lesion. Other: None. CT MAXILLOFACIAL FINDINGS Osseous: There is a minimally displaced  fracture through the right inferior aspect of the nasal bone. No other acute bony abnormalities. Orbits: Negative. No traumatic or inflammatory finding. Sinuses: Clear. Soft tissues: Minimal right supraorbital soft tissue edema. CT CERVICAL SPINE FINDINGS Alignment: Alignment is anatomic. Skull base and vertebrae: No acute fracture. No primary bone lesion or focal pathologic process. Soft tissues and spinal canal: No prevertebral fluid or swelling. No visible canal hematoma. Disc levels: Mild spondylosis at C4-5 and C5-6. Mild diffuse facet hypertrophy. Left predominant neural foraminal narrowing at C5-6. Upper chest: Airway is patent.  Lung apices are clear. Other: Reconstructed images demonstrate no additional findings. IMPRESSION:  1. No acute intracranial process. 2. Minimally displaced right nasal bone fracture. 3. Minimal right supraorbital soft tissue swelling. No underlying skull fracture. 4. Mild cervical spondylosis.  No acute cervical spine fracture. Electronically Signed   By: Sharlet Salina M.D.   On: 07/11/2020 17:16    Active Problems:   GI hemorrhage    Willette Cluster, NP-C @  07/12/2020, 9:38 AM     Attending physician's note   I have taken an interval history, reviewed the chart and examined the patient. I agree with the Advanced Practitioner's note, impression and recommendations.   Patient has been gone for bleeding scan majority of the afternoon.  I just saw her when it was completed.  Brisk GI bleed-painless hematochezia.  Likely diverticular bleed.  Cannot R/O UGI bleed. Syncope  Right clavicular fracture GERD on omeprazole.  Plan: -Bleeding scan.  If pos, IR mesenteric angio/embolization (after contrast desensitization protocol). -If neg, EGD/colon tomorrow.  I have explained risks and benefits. -Trend CBC. Keep Hb>7 -IV Protonix. -D/W pt and patient's Ex (in room). -Please call if any ?/Change in clinical status.   Edman Circle, MD Corinda Gubler  [facility] Manly [phone].  Addendum-RBC scan-equivocal for very subtle ? RLQ focus. Too small to be detected by angio.  Proceed with EGD/colon tomorrow PM with Dr. Rhea Belton.

## 2020-07-13 ENCOUNTER — Encounter (HOSPITAL_COMMUNITY): Payer: Self-pay | Admitting: Family Medicine

## 2020-07-13 ENCOUNTER — Inpatient Hospital Stay (HOSPITAL_COMMUNITY): Payer: BC Managed Care – PPO | Admitting: Certified Registered Nurse Anesthetist

## 2020-07-13 ENCOUNTER — Encounter (HOSPITAL_COMMUNITY)
Admission: EM | Disposition: A | Discharge status: Home / Self Care | Payer: Self-pay | Source: Home / Self Care | Attending: Internal Medicine

## 2020-07-13 DIAGNOSIS — K922 Gastrointestinal hemorrhage, unspecified: Secondary | ICD-10-CM | POA: Diagnosis not present

## 2020-07-13 DIAGNOSIS — R55 Syncope and collapse: Secondary | ICD-10-CM | POA: Diagnosis not present

## 2020-07-13 DIAGNOSIS — K5710 Diverticulosis of small intestine without perforation or abscess without bleeding: Secondary | ICD-10-CM

## 2020-07-13 DIAGNOSIS — D62 Acute posthemorrhagic anemia: Secondary | ICD-10-CM | POA: Diagnosis present

## 2020-07-13 DIAGNOSIS — K625 Hemorrhage of anus and rectum: Secondary | ICD-10-CM | POA: Diagnosis not present

## 2020-07-13 DIAGNOSIS — K921 Melena: Secondary | ICD-10-CM | POA: Diagnosis present

## 2020-07-13 HISTORY — PX: COLONOSCOPY WITH PROPOFOL: SHX5780

## 2020-07-13 HISTORY — PX: ESOPHAGOGASTRODUODENOSCOPY (EGD) WITH PROPOFOL: SHX5813

## 2020-07-13 HISTORY — DX: Diverticulosis of small intestine without perforation or abscess without bleeding: K57.10

## 2020-07-13 LAB — URINE CULTURE

## 2020-07-13 LAB — HEMOGLOBIN AND HEMATOCRIT, BLOOD
HCT: 37.6 % (ref 36.0–46.0)
HCT: 36.5 % (ref 36.0–46.0)
HCT: 35 % — ABNORMAL LOW (ref 36.0–46.0)
Hemoglobin: 12.3 g/dL (ref 12.0–15.0)
Hemoglobin: 12.2 g/dL (ref 12.0–15.0)
Hemoglobin: 11.5 g/dL — ABNORMAL LOW (ref 12.0–15.0)

## 2020-07-13 SURGERY — COLONOSCOPY WITH PROPOFOL
Anesthesia: Monitor Anesthesia Care

## 2020-07-13 MED ORDER — PROPOFOL 500 MG/50ML IV EMUL
INTRAVENOUS | Status: DC | PRN
  Administered 2020-07-13: 125 ug/kg/min via INTRAVENOUS

## 2020-07-13 MED ORDER — LACTATED RINGERS IV SOLN: INTRAVENOUS | Status: DC

## 2020-07-13 MED ORDER — FAMOTIDINE 20 MG PO TABS
20.0000 mg | ORAL_TABLET | Freq: Two times a day (BID) | ORAL | Status: DC | PRN
  Filled 2020-07-13: qty 1

## 2020-07-13 MED ORDER — PROPOFOL 10 MG/ML IV BOLUS
INTRAVENOUS | Status: DC | PRN
  Administered 2020-07-13: 30 mg via INTRAVENOUS
  Administered 2020-07-13 (×4): 20 mg via INTRAVENOUS

## 2020-07-13 MED ORDER — LIDOCAINE 2% (20 MG/ML) 5 ML SYRINGE
INTRAMUSCULAR | Status: DC | PRN
  Administered 2020-07-13: 60 mg via INTRAVENOUS

## 2020-07-13 SURGICAL SUPPLY — 25 items

## 2020-07-13 NOTE — Op Note (Signed)
[facility] Patient Name: Kaylee Pratt Procedure Date: 07/13/2020 MRN: [id number] Attending MD: Beverley Fiedler , MD Date of Birth: Nov 06, 1955 CSN: [id number] Age: 64 Admit Type: Inpatient Procedure:                Upper GI endoscopy Indications:              Hematochezia, Recent gastrointestinal bleeding Providers:                Carie Caddy. Rhea Belton, MD, Dwain Sarna, RN, Lawson Radar, Technician, Waymond Cera, CRNA Referring MD:             [facility] Medicines:                Monitored Anesthesia Care Complications:            No immediate complications. Estimated Blood Loss:     Estimated blood loss: none. Procedure:                Pre-Anesthesia Assessment:                           - Prior to the procedure, a History and Physical                            was performed, and patient medications and                            allergies were reviewed. The patient's tolerance of                            previous anesthesia was also reviewed. The risks                            and benefits of the procedure and the sedation                            options and risks were discussed with the patient.                            All questions were answered, and informed consent                            was obtained. Prior Anticoagulants: The patient has                            taken no previous anticoagulant or antiplatelet                            agents. ASA Grade Assessment: II - A patient with                            mild systemic disease. After reviewing the risks  and benefits, the patient was deemed in                            satisfactory condition to undergo the procedure.                           After obtaining informed consent, the endoscope was                            passed under direct vision. Throughout the                            procedure, the patient's blood pressure, pulse,  and                            oxygen saturations were monitored continuously. The                            GIF-H190 ([id number] Olympus gastroscope was                            introduced through the mouth, and advanced to the                            second part of duodenum. The upper GI endoscopy was                            accomplished without difficulty. The patient                            tolerated the procedure well. Scope In: Scope Out: Findings:      Normal mucosa was found in the entire esophagus.      A 2 cm hiatal hernia was present.      The entire examined stomach was normal.      A large non-bleeding diverticulum was found in the area of the papilla.      The exam of the duodenum was otherwise normal. Impression:               - Normal mucosa was found in the entire esophagus.                           - 2 cm hiatal hernia.                           - Normal stomach.                           - Non-bleeding duodenal diverticulum.                           - No specimens collected. Moderate Sedation:      N/A Recommendation:           - Return patient to hospital ward for ongoing care.                           -  See the other procedure note for documentation of                            additional recommendations. Procedure Code(s):        --- Professional ---                           [phone], Esophagogastroduodenoscopy, flexible,                            transoral; diagnostic, including collection of                            specimen(s) by brushing or washing, when performed                            (separate procedure) Diagnosis Code(s):        --- Professional ---                           K44.9, Diaphragmatic hernia without obstruction or                            gangrene                           K92.1, Melena (includes Hematochezia)                           K92.2, Gastrointestinal hemorrhage, unspecified                           K57.10,  Diverticulosis of small intestine without                            perforation or abscess without bleeding CPT copyright 2019 [employer]. All rights reserved. The codes documented in this report are preliminary and upon coder review may  be revised to meet current compliance requirements. Beverley Fiedler, MD 07/13/2020 3:11:07 PM This report has been signed electronically. Number of Addenda: 0

## 2020-07-13 NOTE — Anesthesia Postprocedure Evaluation (Signed)
Anesthesia Post Note  Patient: Shelda P Heinle  Procedure(s) Performed: COLONOSCOPY WITH PROPOFOL (N/A ) ESOPHAGOGASTRODUODENOSCOPY (EGD) WITH PROPOFOL (N/A )     Patient location during evaluation: Endoscopy Anesthesia Type: MAC Level of consciousness: awake and alert Pain management: pain level controlled Vital Signs Assessment: post-procedure vital signs reviewed and stable Respiratory status: spontaneous breathing, nonlabored ventilation and respiratory function stable Cardiovascular status: blood pressure returned to baseline and stable Postop Assessment: no apparent nausea or vomiting Anesthetic complications: no   No complications documented.  Last Vitals:  Vitals:   07/13/20 1620 07/13/20 1651  BP: 111/65 140/70  Pulse: 75 75  Resp: (!) 29 20  Temp:  37 C  SpO2: 100% 97%    Last Pain:  Vitals:   07/13/20 1651  TempSrc: Oral  PainSc:                  Lidia Collum

## 2020-07-13 NOTE — Transfer of Care (Signed)
Immediate Anesthesia Transfer of Care Note  Patient: Kaylee Pratt  Procedure(s) Performed: COLONOSCOPY WITH PROPOFOL (N/A ) ESOPHAGOGASTRODUODENOSCOPY (EGD) WITH PROPOFOL (N/A )  Patient Location: Endoscopy Unit  Anesthesia Type:MAC  Level of Consciousness: drowsy and patient cooperative  Airway & Oxygen Therapy: Patient Spontanous Breathing and Patient connected to nasal cannula oxygen  Post-op Assessment: Report given to RN and Post -op Vital signs reviewed and stable  Post vital signs: Reviewed and stable  Last Vitals:  Vitals Value Taken Time  BP 91/67 07/13/20 1600  Temp 36.9 C 07/13/20 1600  Pulse 82 07/13/20 1605  Resp 29 07/13/20 1605  SpO2 100 % 07/13/20 1605  Vitals shown include unvalidated device data.  Last Pain:  Vitals:   07/13/20 1600  TempSrc: Oral  PainSc:          Complications: No complications documented.

## 2020-07-13 NOTE — Consult Note (Signed)
Orthopaedic Consultation   Kaylee Pratt  ERX:[id number]  DOB: 10/13/1955  DOA: 07/11/2020  PCP: Sheliah Hatch, MD  Requesting physician: Ander Slade, ED  Reason for consultation: Right Clavicle Fracture S/P syncope episode and fall   History of Present Illness: Kaylee Pratt is an 64 y.o. female, with history of osteoporosis, hyperlipidemia, hiatal hernia, GERD, depression, asthma, and more presented to the ED with a chief complaint of S/P a fall. She had R shoulder pain immediately after her fall as well as fascial pain. She fell due to a sycope episode after having had several bloody bowel mvmts and rectal bleeding.   She called her children's father and said that she has to go to the hospital.  She was sitting outside when she made the phone call, she got up to go back inside, and she had a syncope and collapsed. When she came too she was oriented immediately. She reports that she woke up laying on her right side.  She felt pain all over her body but especially in her head and shoulder.  She told bystanders that she did not think she should get up and they should call EMS to move her. She was taken to the ER and diagnosed with rectal bleeding, R clavicle fracture, and fascial fracture.We were consulted for her right clavicle fracture. She reports ongoing R clavicle pain and swelling. She describes her pain as a moderate ache at baseline with intermittent sharp stabbing pain with any motion. She denies any numbness/tinging/change in temperature or pain in the right upper extremity. Sensation is intact distally. No history of shoulder or clavicle pathology. She is comfortable with well controlled pain on her current medications. She does have history of osteoporosis  Review of Systems:   As per HPI otherwise 10 point review of systems negative.   Review of Systems Past Medical History: Past Medical History:  Diagnosis Date  . Allergy   . Asthma   . Depression   . Duodenal diverticulum    . GERD (gastroesophageal reflux disease)   . GERD (gastroesophageal reflux disease)   . Hiatal hernia   . Hyperlipidemia   . Osteoporosis    Past Surgical History: Past Surgical History:  Procedure Laterality Date  . TUBAL LIGATION     Allergies:   Allergies  Allergen Reactions  . Amoxil [Amoxicillin] Rash  . Iohexol Hives    Pt developed hives on her face and chest after IV contrast    Social History:  reports that she has never smoked. She has never used smokeless tobacco. She reports current alcohol use of about 2.0 standard drinks of alcohol per week. She reports that she does not use drugs.  Family History: Family History  Problem Relation Age of Onset  . Prostate cancer Father   . Other Neg Hx        no early CVA/CAD, no cancer  . Sudden death Neg Hx   . Diabetes Neg Hx   . Heart attack Neg Hx   . Hyperlipidemia Neg Hx   . Hypertension Neg Hx    Physical Exam: Vitals:   07/13/20 0025 07/13/20 0054 07/13/20 0108 07/13/20 0400  BP: 121/70 129/62 124/62 127/64  Pulse: 87 90 87 89  Resp: 16 16 16  (!) 21  Temp: 98.8 F (37.1 C) 98.9 F (37.2 C) 98.8 F (37.1 C) 98.3 F (36.8 C)  TempSrc: Oral Oral Oral Oral  SpO2: 93% 94% 94% 94%  Weight:      Height:        Constitutional: Alert and awake, oriented x3, not in any acute distress. She is resting in bed in a sling on the RUE, partner at bedside   Eyes: PERLA, EOMI, irises appear normal, anicteric sclera,  ENMT: external ears and nose appear normal, normal hearing             Lips appears normal, oropharynx mucosa, tongue, posterior pharynx appear normal  Neck: neck appears normal, no masses, normal ROM, no thyromegaly, no JVD  CVS:  no LE edema, normal pedal pulses, normal radial pulses = BIL, cap refill <2sec Respiratory:  Respiratory effort normal. No accessory muscle use.  Musculoskeletal: Prominent swelling about R mid clavicle, TTP R clavicle, mild deformity, mild ecchymosis, no skin break, ROM of  elbow and wrist full, px with any shoulder ROM at clavicle. MMT full below elbow, distal compartments soft Neuro: sensation intact  Psych: judgement and insight appear normal, stable mood and affect, mental status Skin: no rashes or lesions or ulcers, no induration or nodules   Labs:  CBC: Recent Labs  Lab 07/11/20 1500 07/11/20 1500 07/11/20 2115 07/12/20 0343 07/12/20 0854 07/12/20 2112 07/13/20 0405  WBC 12.6*  --  10.0  --  6.9  --   --   NEUTROABS 10.2*  --   --   --  4.5  --   --   HGB 11.3*   < > 9.1* 9.0* 7.1* 10.6* 12.3  HCT 36.3   < > 28.2* 29.2* 22.1* 32.2* 37.6  MCV 93.8  --  92.2  --  95.3  --   --   PLT 304  --  248  --  214  --   --    < > = values in this interval not displayed.    Basic Metabolic Panel: Recent Labs  Lab 07/11/20 1500 07/12/20 0343  NA 136 137  K 4.5 4.3  CL 104 105  CO2 21* 22  GLUCOSE 177* 103*  BUN 22 14  CREATININE 0.77 0.65  CALCIUM 8.5* 8.3*  MG  --  1.9   GFR Estimated Creatinine Clearance: 57.6 mL/min (by C-G formula based on SCr of 0.65 mg/dL). Liver Function Tests: Recent Labs  Lab 07/11/20 1500 07/12/20 0343  AST 30 20  ALT 32 25  ALKPHOS 58 45  BILITOT 0.9 0.8  PROT 7.3 5.9*  ALBUMIN 3.9 3.2*   No results for input(s): LIPASE, AMYLASE in the last 168 hours. Recent Labs  Lab 07/11/20 1606  AMMONIA 27   Coagulation profile Recent Labs  Lab 07/12/20 0343  INR 1.1    Cardiac Enzymes: Recent Labs  Lab 07/11/20 1606  CKTOTAL 53   BNP: Invalid input(s): POCBNP CBG: Recent Labs  Lab 07/11/20 1505  GLUCAP 165*   D-Dimer No results for input(s): DDIMER in the last 72 hours. Hgb A1c No results for input(s): HGBA1C in the last 72 hours. Lipid Profile No results for input(s): CHOL, HDL, LDLCALC, TRIG, CHOLHDL, LDLDIRECT in the last 72 hours. Thyroid function studies No results for input(s): TSH, T4TOTAL, T3FREE, THYROIDAB in the last 72 hours.  Invalid input(s): FREET3 Anemia work up No results  for input(s): VITAMINB12, FOLATE, FERRITIN, TIBC, IRON, RETICCTPCT in the last 72 hours. Urinalysis    Component Value Date/Time   COLORURINE YELLOW 07/11/2020 1748   APPEARANCEUR CLEAR 07/11/2020 1748   LABSPEC 1.024 07/11/2020 1748   PHURINE 6.0 07/11/2020 1748   GLUCOSEU NEGATIVE 07/11/2020  1748   GLUCOSEU NEGATIVE 11/15/2017 1027   HGBUR MODERATE (A) 07/11/2020 1748   BILIRUBINUR NEGATIVE 07/11/2020 1748   KETONESUR NEGATIVE 07/11/2020 1748   PROTEINUR NEGATIVE 07/11/2020 1748   UROBILINOGEN 0.2 11/15/2017 1027   NITRITE NEGATIVE 07/11/2020 1748   LEUKOCYTESUR TRACE (A) 07/11/2020 1748     Sepsis Labs Invalid input(s): PROCALCITONIN,  WBC,  LACTICIDVEN Microbiology Recent Results (from the past 240 hour(s))  Respiratory Panel by RT PCR (Flu A&B, Covid) - Nasopharyngeal Swab     Status: None   Collection Time: 07/11/20  4:06 PM   Specimen: Nasopharyngeal Swab  Result Value Ref Range Status   SARS Coronavirus 2 by RT PCR NEGATIVE NEGATIVE Final    Comment: (NOTE) SARS-CoV-2 target nucleic acids are NOT DETECTED.  The SARS-CoV-2 RNA is generally detectable in upper respiratoy specimens during the acute phase of infection. The lowest concentration of SARS-CoV-2 viral copies this assay can detect is 131 copies/mL. A negative result does not preclude SARS-Cov-2 infection and should not be used as the sole basis for treatment or other patient management decisions. A negative result may occur with  improper specimen collection/handling, submission of specimen other than nasopharyngeal swab, presence of viral mutation(s) within the areas targeted by this assay, and inadequate number of viral copies (<131 copies/mL). A negative result must be combined with clinical observations, patient history, and epidemiological information. The expected result is Negative.  Fact Sheet for Patients:  https://www.moore.com/  Fact Sheet for Healthcare Providers:   https://www.young.biz/  This test is no t yet approved or cleared by the [place] FDA and  has been authorized for detection and/or diagnosis of SARS-CoV-2 by FDA under an Emergency Use Authorization (EUA). This EUA will remain  in effect (meaning this test can be used) for the duration of the COVID-19 declaration under Section 564(b)(1) of the Act, 21 U.S.C. section 360bbb-3(b)(1), unless the authorization is terminated or revoked sooner.     Influenza A by PCR NEGATIVE NEGATIVE Final   Influenza B by PCR NEGATIVE NEGATIVE Final    Comment: (NOTE) The Xpert Xpress SARS-CoV-2/FLU/RSV assay is intended as an aid in  the diagnosis of influenza from Nasopharyngeal swab specimens and  should not be used as a sole basis for treatment. Nasal washings and  aspirates are unacceptable for Xpert Xpress SARS-CoV-2/FLU/RSV  testing.  Fact Sheet for Patients: https://www.moore.com/  Fact Sheet for Healthcare Providers: https://www.young.biz/  This test is not yet approved or cleared by the [place] FDA and  has been authorized for detection and/or diagnosis of SARS-CoV-2 by  FDA under an Emergency Use Authorization (EUA). This EUA will remain  in effect (meaning this test can be used) for the duration of the  Covid-19 declaration under Section 564(b)(1) of the Act, 21  U.S.C. section 360bbb-3(b)(1), unless the authorization is  terminated or revoked. Performed at [facility], [address] [place], [place] [address]   Culture, Urine     Status: Abnormal   Collection Time: 07/11/20  5:48 PM   Specimen: Urine, Clean Catch  Result Value Ref Range Status   Specimen Description   Final    URINE, CLEAN CATCH Performed at [facility], [address], [place], [place] [address]    Special Requests   Final    NONE Performed at [facility], [address]  [place], [place] [address]    Culture MULTIPLE SPECIES PRESENT, SUGGEST RECOLLECTION (A)  Final   Report Status 07/13/2020 FINAL  Final       Inpatient Medications:   Scheduled Meds: . clonazePAM  1 mg Oral QHS  . escitalopram  10 mg Oral QHS  . montelukast  10 mg Oral QHS  . [START ON 07/15/2020] pantoprazole  40 mg Intravenous Q12H  . [START ON 07/14/2020] simvastatin  10 mg Oral Once per day on Mon Thu   Continuous Infusions: . sodium chloride 100 mL/hr at 07/12/20 0102  . methocarbamol (ROBAXIN) IV    . pantoprozole (PROTONIX) infusion 8 mg/hr (07/13/20 0110)     Radiological Exams on Admission: CT ABDOMEN PELVIS WO CONTRAST  Result Date: 07/11/2020 CLINICAL DATA:  Generalized abdominal pain following fall, initial encounter EXAM: CT CHEST, ABDOMEN AND PELVIS WITHOUT CONTRAST TECHNIQUE: Multidetector CT imaging of the chest, abdomen and pelvis was performed following the standard protocol without IV contrast. COMPARISON:  Chest x-ray from earlier in the same day. FINDINGS: CT CHEST FINDINGS Cardiovascular: Somewhat limited due to lack of IV contrast. Atherosclerotic calcifications are noted. No cardiac enlargement is seen. Mild coronary calcifications are noted. Mediastinum/Nodes: Thoracic inlet is within normal limits. No sizable hilar or mediastinal adenopathy is noted. The esophagus as visualized is within normal limits. Lungs/Pleura: The lungs are well aerated bilaterally. Minimal bibasilar atelectatic changes are seen. No focal confluent infiltrate is noted. No sizable effusion is seen. No parenchymal nodule is noted. Musculoskeletal: Comminuted right clavicular fracture is noted similar to that seen on prior plain film examination. Angulated fractures of the right second, third and fourth ribs anteriorly are seen. No displacement is noted and these are likely chronic in nature. No compression deformities are seen. No sternal fracture is noted. CT ABDOMEN PELVIS FINDINGS Hepatobiliary:  Liver is well visualized within normal limits. Gallbladder is well distended. Tiny dependent gallstone is noted. Pancreas: Unremarkable. No pancreatic ductal dilatation or surrounding inflammatory changes. Spleen: Normal in size without focal abnormality. Adrenals/Urinary Tract: Adrenal glands are within normal limits. Kidneys show no renal calculi or obstructive changes. Ureters are within normal limits to the level of the urinary bladder. Bladder is predominately decompressed. Stomach/Bowel: Colon is predominately decompressed. Mild diverticular changes noted without evidence of diverticulitis. The appendix is unremarkable. Small bowel and stomach are within normal limits with the exception of a large duodenal diverticulum adjacent to the head of the pancreas. Vascular/Lymphatic: No significant vascular findings are present. No enlarged abdominal or pelvic lymph nodes. Reproductive: Uterus and bilateral adnexa are unremarkable. Other: No abdominal wall hernia or abnormality. No abdominopelvic ascites. Musculoskeletal: No acute or significant osseous findings. IMPRESSION: Comminuted right clavicular fracture similar to that seen on prior plain film examination. Chronic appearing fractures of the right second, third and fourth ribs as described. Single gallstone. Diverticulosis without diverticulitis. Electronically Signed   By: Alcide Clever M.D.   On: 07/11/2020 17:19   DG Clavicle Right  Result Date: 07/11/2020 CLINICAL DATA:  Larey Seat, deformity EXAM: RIGHT CLAVICLE - 2+ VIEWS COMPARISON:  07/11/2020, 07/22/2017 FINDINGS: Frontal and lordotic views of the right clavicle demonstrate a comminuted displaced mid right clavicular fracture with 1 shaft width inferior displacement of the lateral fracture fragment. Acromioclavicular and glenohumeral joints are well aligned. The right chest is clear. IMPRESSION: 1. Comminuted displaced mid right clavicular fracture. Electronically Signed   By: Sharlet Salina M.D.   On:  07/11/2020 16:18   CT Head Wo Contrast  Result Date: 07/11/2020 CLINICAL DATA:  Larey Seat, hit head, nausea and emesis EXAM: CT HEAD WITHOUT CONTRAST CT MAXILLOFACIAL WITHOUT CONTRAST CT CERVICAL SPINE WITHOUT CONTRAST TECHNIQUE:  Multidetector CT imaging of the head, cervical spine, and maxillofacial structures were performed using the standard protocol without intravenous contrast. Multiplanar CT image reconstructions of the cervical spine and maxillofacial structures were also generated. COMPARISON:  07/11/2015 FINDINGS: CT HEAD FINDINGS Brain: No acute infarct or hemorrhage. Lateral ventricles and midline structures are stable, with continued benign basal ganglia calcifications noted. No acute extra-axial fluid collections. No mass effect. Vascular: No hyperdense vessel or unexpected calcification. Skull: Normal. Negative for fracture or focal lesion. Other: None. CT MAXILLOFACIAL FINDINGS Osseous: There is a minimally displaced fracture through the right inferior aspect of the nasal bone. No other acute bony abnormalities. Orbits: Negative. No traumatic or inflammatory finding. Sinuses: Clear. Soft tissues: Minimal right supraorbital soft tissue edema. CT CERVICAL SPINE FINDINGS Alignment: Alignment is anatomic. Skull base and vertebrae: No acute fracture. No primary bone lesion or focal pathologic process. Soft tissues and spinal canal: No prevertebral fluid or swelling. No visible canal hematoma. Disc levels: Mild spondylosis at C4-5 and C5-6. Mild diffuse facet hypertrophy. Left predominant neural foraminal narrowing at C5-6. Upper chest: Airway is patent.  Lung apices are clear. Other: Reconstructed images demonstrate no additional findings. IMPRESSION: 1. No acute intracranial process. 2. Minimally displaced right nasal bone fracture. 3. Minimal right supraorbital soft tissue swelling. No underlying skull fracture. 4. Mild cervical spondylosis.  No acute cervical spine fracture. Electronically Signed   By:  Sharlet Salina M.D.   On: 07/11/2020 17:16   CT Chest Wo Contrast  Result Date: 07/11/2020 CLINICAL DATA:  Generalized abdominal pain following fall, initial encounter EXAM: CT CHEST, ABDOMEN AND PELVIS WITHOUT CONTRAST TECHNIQUE: Multidetector CT imaging of the chest, abdomen and pelvis was performed following the standard protocol without IV contrast. COMPARISON:  Chest x-ray from earlier in the same day. FINDINGS: CT CHEST FINDINGS Cardiovascular: Somewhat limited due to lack of IV contrast. Atherosclerotic calcifications are noted. No cardiac enlargement is seen. Mild coronary calcifications are noted. Mediastinum/Nodes: Thoracic inlet is within normal limits. No sizable hilar or mediastinal adenopathy is noted. The esophagus as visualized is within normal limits. Lungs/Pleura: The lungs are well aerated bilaterally. Minimal bibasilar atelectatic changes are seen. No focal confluent infiltrate is noted. No sizable effusion is seen. No parenchymal nodule is noted. Musculoskeletal: Comminuted right clavicular fracture is noted similar to that seen on prior plain film examination. Angulated fractures of the right second, third and fourth ribs anteriorly are seen. No displacement is noted and these are likely chronic in nature. No compression deformities are seen. No sternal fracture is noted. CT ABDOMEN PELVIS FINDINGS Hepatobiliary: Liver is well visualized within normal limits. Gallbladder is well distended. Tiny dependent gallstone is noted. Pancreas: Unremarkable. No pancreatic ductal dilatation or surrounding inflammatory changes. Spleen: Normal in size without focal abnormality. Adrenals/Urinary Tract: Adrenal glands are within normal limits. Kidneys show no renal calculi or obstructive changes. Ureters are within normal limits to the level of the urinary bladder. Bladder is predominately decompressed. Stomach/Bowel: Colon is predominately decompressed. Mild diverticular changes noted without evidence of  diverticulitis. The appendix is unremarkable. Small bowel and stomach are within normal limits with the exception of a large duodenal diverticulum adjacent to the head of the pancreas. Vascular/Lymphatic: No significant vascular findings are present. No enlarged abdominal or pelvic lymph nodes. Reproductive: Uterus and bilateral adnexa are unremarkable. Other: No abdominal wall hernia or abnormality. No abdominopelvic ascites. Musculoskeletal: No acute or significant osseous findings. IMPRESSION: Comminuted right clavicular fracture similar to that seen on prior plain film examination. Chronic appearing  fractures of the right second, third and fourth ribs as described. Single gallstone. Diverticulosis without diverticulitis. Electronically Signed   By: Alcide CleverMark  Lukens M.D.   On: 07/11/2020 17:19   CT Cervical Spine Wo Contrast  Result Date: 07/11/2020 CLINICAL DATA:  Larey SeatFell, hit head, nausea and emesis EXAM: CT HEAD WITHOUT CONTRAST CT MAXILLOFACIAL WITHOUT CONTRAST CT CERVICAL SPINE WITHOUT CONTRAST TECHNIQUE: Multidetector CT imaging of the head, cervical spine, and maxillofacial structures were performed using the standard protocol without intravenous contrast. Multiplanar CT image reconstructions of the cervical spine and maxillofacial structures were also generated. COMPARISON:  07/11/2015 FINDINGS: CT HEAD FINDINGS Brain: No acute infarct or hemorrhage. Lateral ventricles and midline structures are stable, with continued benign basal ganglia calcifications noted. No acute extra-axial fluid collections. No mass effect. Vascular: No hyperdense vessel or unexpected calcification. Skull: Normal. Negative for fracture or focal lesion. Other: None. CT MAXILLOFACIAL FINDINGS Osseous: There is a minimally displaced fracture through the right inferior aspect of the nasal bone. No other acute bony abnormalities. Orbits: Negative. No traumatic or inflammatory finding. Sinuses: Clear. Soft tissues: Minimal right  supraorbital soft tissue edema. CT CERVICAL SPINE FINDINGS Alignment: Alignment is anatomic. Skull base and vertebrae: No acute fracture. No primary bone lesion or focal pathologic process. Soft tissues and spinal canal: No prevertebral fluid or swelling. No visible canal hematoma. Disc levels: Mild spondylosis at C4-5 and C5-6. Mild diffuse facet hypertrophy. Left predominant neural foraminal narrowing at C5-6. Upper chest: Airway is patent.  Lung apices are clear. Other: Reconstructed images demonstrate no additional findings. IMPRESSION: 1. No acute intracranial process. 2. Minimally displaced right nasal bone fracture. 3. Minimal right supraorbital soft tissue swelling. No underlying skull fracture. 4. Mild cervical spondylosis.  No acute cervical spine fracture. Electronically Signed   By: Sharlet SalinaMichael  Brown M.D.   On: 07/11/2020 17:16   NM GI Blood Loss  Result Date: 07/12/2020 CLINICAL DATA:  Fresh rectal bleeding. EXAM: NUCLEAR MEDICINE GASTROINTESTINAL BLEEDING SCAN TECHNIQUE: Sequential abdominal images were obtained following intravenous administration of Tc-2220m labeled red blood cells. RADIOPHARMACEUTICALS:  21.3 mCi Tc-3120m pertechnetate in-vitro labeled red cells. COMPARISON:  CT abdomen without contrast 07/11/2020 FINDINGS: During the second hour of imaging, there is faint activity in the RIGHT lower quadrant. This activity is subtle and difficult to tell if it localized to the large or small bowel. This could represent a small focus of trace GI bleeding or oozing. The very small quantity of radiotracer is likely too be small a volume to be detected by angiography.w IMPRESSION: Very subtle focus of potential gastrointestinal bleeding in the RIGHT lower quadrant as described above. Electronically Signed   By: Genevive BiStewart  Edmunds M.D.   On: 07/12/2020 16:16   DG Pelvis Portable  Result Date: 07/11/2020 CLINICAL DATA:  Larey SeatFell, nausea, emesis EXAM: PORTABLE PELVIS 1-2 VIEWS COMPARISON:  10/31/2009 FINDINGS:  Supine frontal view of the pelvis demonstrates no fractures. Alignment is anatomic. Joint spaces are well preserved. Sacroiliac joints are normal. IMPRESSION: 1. Unremarkable bony pelvis. Electronically Signed   By: Sharlet SalinaMichael  Brown M.D.   On: 07/11/2020 16:16   DG Chest Portable 1 View  Result Date: 07/11/2020 CLINICAL DATA:  Fall, possible diminished lung sounds on right EXAM: PORTABLE CHEST 1 VIEW COMPARISON:  2018 FINDINGS: Mild elevation of the right hemidiaphragm. No pleural effusion. No pneumothorax. Cardiomediastinal contours are within normal limits with normal heart size. Acute fracture of the mid right clavicle. IMPRESSION: Acute fracture of the mid right clavicle. No other acute process in the chest. Mild  elevation of the right hemidiaphragm. Electronically Signed   By: Guadlupe Spanish M.D.   On: 07/11/2020 16:20   CT Maxillofacial Wo Contrast  Result Date: 07/11/2020 CLINICAL DATA:  Larey Seat, hit head, nausea and emesis EXAM: CT HEAD WITHOUT CONTRAST CT MAXILLOFACIAL WITHOUT CONTRAST CT CERVICAL SPINE WITHOUT CONTRAST TECHNIQUE: Multidetector CT imaging of the head, cervical spine, and maxillofacial structures were performed using the standard protocol without intravenous contrast. Multiplanar CT image reconstructions of the cervical spine and maxillofacial structures were also generated. COMPARISON:  07/11/2015 FINDINGS: CT HEAD FINDINGS Brain: No acute infarct or hemorrhage. Lateral ventricles and midline structures are stable, with continued benign basal ganglia calcifications noted. No acute extra-axial fluid collections. No mass effect. Vascular: No hyperdense vessel or unexpected calcification. Skull: Normal. Negative for fracture or focal lesion. Other: None. CT MAXILLOFACIAL FINDINGS Osseous: There is a minimally displaced fracture through the right inferior aspect of the nasal bone. No other acute bony abnormalities. Orbits: Negative. No traumatic or inflammatory finding. Sinuses: Clear. Soft  tissues: Minimal right supraorbital soft tissue edema. CT CERVICAL SPINE FINDINGS Alignment: Alignment is anatomic. Skull base and vertebrae: No acute fracture. No primary bone lesion or focal pathologic process. Soft tissues and spinal canal: No prevertebral fluid or swelling. No visible canal hematoma. Disc levels: Mild spondylosis at C4-5 and C5-6. Mild diffuse facet hypertrophy. Left predominant neural foraminal narrowing at C5-6. Upper chest: Airway is patent.  Lung apices are clear. Other: Reconstructed images demonstrate no additional findings. IMPRESSION: 1. No acute intracranial process. 2. Minimally displaced right nasal bone fracture. 3. Minimal right supraorbital soft tissue swelling. No underlying skull fracture. 4. Mild cervical spondylosis.  No acute cervical spine fracture. Electronically Signed   By: Sharlet Salina M.D.   On: 07/11/2020 17:16    Impression/Recommendations Active Problems:   GI hemorrhage  Foolowed by medicine team and GI  Comminuted/displaced, R Clavicular Fracture:   -Continue sling RUE  -OK for free mvmt of elbow and wrist  -Ice and medication for pain as desired, currently well controlled on current regimen  -F/U in office upon D/C with Dr Ave Filter, [facility]    -Discussed with pt non-emergent, could heal conservatively or with surgical plating, can be discussed at later date upon D/C and completion of GI Bleeding workup and treatment   F/U Dr Ave Filter upon D/C  [facility]  [address], [place]  [phone]  Thank you for this consultation.   Eilene Ghazi Eldoris Beiser PA-C [facility] 07/13/2020, 8:55 AM

## 2020-07-13 NOTE — Op Note (Signed)
[facility] Patient Name: Kaylee FiddlerHa Amster Procedure Date: 07/13/2020 MRN: [id number] Attending MD: Beverley FiedlerJay M Mazi Brailsford , MD Date of Birth: 12/20/1955 CSN: [id number] Age: [id number] Admit Type: Inpatient Procedure:                Colonoscopy Indications:              Hematochezia, positive nuclear imaging study                            suggesting slow bleeding from RLQ Providers:                Carie CaddyJay M. Rhea BeltonPyrtle, MD, Dwain SarnaPatricia Ford, RN, Lawson Radararlene                            Davis, Technician, Waymond CeraJosh Jarvela, CRNA Referring MD:             [facility] Medicines:                Monitored Anesthesia Care Complications:            No immediate complications. Estimated Blood Loss:     Estimated blood loss: none. Procedure:                Pre-Anesthesia Assessment:                           - Prior to the procedure, a History and Physical                            was performed, and patient medications and                            allergies were reviewed. The patient's tolerance of                            previous anesthesia was also reviewed. The risks                            and benefits of the procedure and the sedation                            options and risks were discussed with the patient.                            All questions were answered, and informed consent                            was obtained. Prior Anticoagulants: The patient has                            taken no previous anticoagulant or antiplatelet                            agents. ASA Grade Assessment: II - A patient with  mild systemic disease. After reviewing the risks                            and benefits, the patient was deemed in                            satisfactory condition to undergo the procedure.                           After obtaining informed consent, the colonoscope                            was passed under direct vision. Throughout the                             procedure, the patient's blood pressure, pulse, and                            oxygen saturations were monitored continuously. The                            PCF-H190DL ([id number]) Olympus pediatric colonoscope                            was introduced through the anus and advanced to the                            terminal ileum. The colonoscopy was performed                            without difficulty. The patient tolerated the                            procedure well. The quality of the bowel                            preparation was good. Scope In: 3:13:40 PM Scope Out: 3:49:13 PM Scope Withdrawal Time: 0 hours 30 minutes 22 seconds  Total Procedure Duration: 0 hours 35 minutes 33 seconds  Findings:      Perianal skin tags were found on perianal exam.      The terminal ileum appeared normal.      Red blood was found in the entire colon. This is consistent with recent       GI bleeding. Copious irrigation and lavage was performed to clear colon       with good result.      Four small-mouthed diverticula were found in the ascending colon. This       is the presumed source of recent GI bleeding. These appeared innocent       and time was spent simply observing with no evidence of active bleeding       during this study.      The exam was otherwise normal throughout the examined colon.      Internal hemorrhoids were found during retroflexion. The hemorrhoids       were small. Several post-hemorrhoidal banding scars  in the distal rectum. Impression:               - Perianal skin tags found on perianal exam.                           - The examined portion of the ileum was normal.                           - Blood in the entire examined colon. Cleared with                            copious irrigation and lavage.                           - Diverticulosis in the ascending colon without                            active bleeding during this study. This is the most                             likely source of recent GI bleeding.                           - Very small and nonbleeding internal hemorrhoids.                           - No specimens collected. Moderate Sedation:      N/A Recommendation:           - Return patient to hospital ward for ongoing care.                           - Full liquid diet.                           - Continue present medications.                           - If further hematochezia IR consult is recommended                            for consideration of angiography of right colon                            given tagged RBC and colonoscopy findings.                           - Monitor Hgb. Procedure Code(s):        --- Professional ---                           [phone], Colonoscopy, flexible; diagnostic, including                            collection of specimen(s) by brushing or washing,  when performed (separate procedure) Diagnosis Code(s):        --- Professional ---                           K64.8, Other hemorrhoids                           K92.2, Gastrointestinal hemorrhage, unspecified                           K64.4, Residual hemorrhoidal skin tags                           K92.1, Melena (includes Hematochezia)                           K57.30, Diverticulosis of large intestine without                            perforation or abscess without bleeding CPT copyright 2019 American Medical Association. All rights reserved. The codes documented in this report are preliminary and upon coder review may  be revised to meet current compliance requirements. Beverley Fiedler, MD 07/13/2020 4:08:09 PM This report has been signed electronically. Number of Addenda: 0

## 2020-07-13 NOTE — Progress Notes (Signed)
Kaylee Pratt  KZS:[id number] DOB: 04-10-1956 DOA: 07/11/2020 PCP: Sheliah Hatch, MD    Brief Narrative:  64 year old with a history of osteoporosis, HLD, HH, GERD, depression, asthma, and known internal hemorrhoids who presented to the ER with bright red blood per rectum and a syncopal spell occurring the day of admission.  She reported 2 large bloody bowel movements with abdominal discomfort but no nausea or vomiting.  She attempted to stand from a sitting position and immediately felt her right side, resulting in a fracture of her nasal bone and right clavicle.  She was admitted through the [facility] ED with lower GI bleeding and traumatic fractures.  Significant Events:  11/1 admit via ED with BRBPR, syncope, traumatic fractures 11/2 RR to room after large episode of hematochezia with syncope/orthostasis  Antimicrobials:  None  DVT prophylaxis: SCDs  Subjective: Resting comfortably in bed upon returning from endoscopy suite.  Reports pain in her right shoulder.  Denies abdominal pain nausea vomiting or shortness of breath.  Assessment & Plan:  Acute GI bleed nuclear medicine bleeding scan suggestive of possible right lower quadrant source -colonoscopy today consistent with diverticular bleeding -EGD without evidence of upper GI bleeding -care as per GI -hemodynamically stable at present  IV contrast allergy  Acute symptomatic anemia of acute blood loss Status post 3 units PRBC thus far -hemoglobin appears to be stabilizing -recheck in a.m.  Recent Labs  Lab 07/12/20 0343 07/12/20 0854 07/12/20 2112 07/13/20 0405 07/13/20 1019  HGB 9.0* 7.1* 10.6* 12.3 12.2     Syncope Due to hypovolemia/acute blood loss  Comminuted displaced fracture of R clavicle Management per orthopedic surgery -continue sling -okay for free movement of elbow and wrist -ice and medication for pain as desired -to follow-up with Dr. Ave Filter at [facility]  GERD Continue usual  PPI  Depression Continue usual Lexapro   Code Status: FULL CODE Family Communication: Spoke with patient and husband at bedside at length Status is: Inpatient  Remains inpatient appropriate because:Inpatient level of care appropriate due to severity of illness   Dispo: The patient is from: Home              Anticipated d/c is to: Home              Anticipated d/c date is: 2 days              Patient currently is not medically stable to d/c.   Consultants:  Estate manager/land agent GI  Objective: Blood pressure 127/64, pulse 89, temperature 98.3 F (36.8 C), temperature source Oral, resp. rate (!) 21, height 5' (1.524 m), weight 58.5 kg, SpO2 94 %.  Intake/Output Summary (Last 24 hours) at 07/13/2020 1019 Last data filed at 07/13/2020 0500 Gross per 24 hour  Intake 1626.42 ml  Output 1450 ml  Net 176.42 ml   Filed Weights   07/11/20 1456  Weight: 58.5 kg    Examination: General: No acute respiratory distress Lungs: Clear to auscultation bilaterally without wheezes or crackles Cardiovascular: Regular rate and rhythm without murmur gallop or rub normal S1 and S2 Abdomen: Nontender, nondistended, soft, bowel sounds positive, no rebound, no ascites, no appreciable mass Extremities: No significant cyanosis, clubbing, or edema bilateral lower extremities  CBC: Recent Labs  Lab 07/11/20 1500 07/11/20 1500 07/11/20 2115 07/12/20 0343 07/12/20 0854 07/12/20 2112 07/13/20 0405  WBC 12.6*  --  10.0  --  6.9  --   --   NEUTROABS 10.2*  --   --   --  4.5  --   --   HGB 11.3*   < > 9.1*   < > 7.1* 10.6* 12.3  HCT 36.3   < > 28.2*   < > 22.1* 32.2* 37.6  MCV 93.8  --  92.2  --  95.3  --   --   PLT 304  --  248  --  214  --   --    < > = values in this interval not displayed.   Basic Metabolic Panel: Recent Labs  Lab 07/11/20 1500 07/12/20 0343  NA 136 137  K 4.5 4.3  CL 104 105  CO2 21* 22  GLUCOSE 177* 103*  BUN 22 14  CREATININE 0.77 0.65  CALCIUM 8.5*  8.3*  MG  --  1.9   GFR: Estimated Creatinine Clearance: 57.6 mL/min (by C-G formula based on SCr of 0.65 mg/dL).  Liver Function Tests: Recent Labs  Lab 07/11/20 1500 07/12/20 0343  AST 30 20  ALT 32 25  ALKPHOS 58 45  BILITOT 0.9 0.8  PROT 7.3 5.9*  ALBUMIN 3.9 3.2*    Recent Labs  Lab 07/11/20 1606  AMMONIA 27    Coagulation Profile: Recent Labs  Lab 07/12/20 0343  INR 1.1    Cardiac Enzymes: Recent Labs  Lab 07/11/20 1606  CKTOTAL 53    HbA1C: Hgb A1c MFr Bld  Date/Time Value Ref Range Status  05/12/2019 11:20 AM 5.4 4.6 - 6.5 % Final    Comment:    Glycemic Control Guidelines for People with Diabetes:Non Diabetic:  <6%Goal of Therapy: <7%Additional Action Suggested:  >8%     CBG: Recent Labs  Lab 07/11/20 1505  GLUCAP 165*    Recent Results (from the past 240 hour(s))  Respiratory Panel by RT PCR (Flu A&B, Covid) - Nasopharyngeal Swab     Status: None   Collection Time: 07/11/20  4:06 PM   Specimen: Nasopharyngeal Swab  Result Value Ref Range Status   SARS Coronavirus 2 by RT PCR NEGATIVE NEGATIVE Final    Comment: (NOTE) SARS-CoV-2 target nucleic acids are NOT DETECTED.  The SARS-CoV-2 RNA is generally detectable in upper respiratoy specimens during the acute phase of infection. The lowest concentration of SARS-CoV-2 viral copies this assay can detect is 131 copies/mL. A negative result does not preclude SARS-Cov-2 infection and should not be used as the sole basis for treatment or other patient management decisions. A negative result may occur with  improper specimen collection/handling, submission of specimen other than nasopharyngeal swab, presence of viral mutation(s) within the areas targeted by this assay, and inadequate number of viral copies (<131 copies/mL). A negative result must be combined with clinical observations, patient history, and epidemiological information. The expected result is Negative.  Fact Sheet for Patients:   https://www.moore.com/  Fact Sheet for Healthcare Providers:  https://www.young.biz/  This test is no t yet approved or cleared by the [place] FDA and  has been authorized for detection and/or diagnosis of SARS-CoV-2 by FDA under an Emergency Use Authorization (EUA). This EUA will remain  in effect (meaning this test can be used) for the duration of the COVID-19 declaration under Section 564(b)(1) of the Act, 21 U.S.C. section 360bbb-3(b)(1), unless the authorization is terminated or revoked sooner.     Influenza A by PCR NEGATIVE NEGATIVE Final   Influenza B by PCR NEGATIVE NEGATIVE Final    Comment: (NOTE) The Xpert Xpress SARS-CoV-2/FLU/RSV assay is intended as an aid in  the diagnosis of influenza from Nasopharyngeal  swab specimens and  should not be used as a sole basis for treatment. Nasal washings and  aspirates are unacceptable for Xpert Xpress SARS-CoV-2/FLU/RSV  testing.  Fact Sheet for Patients: https://www.moore.com/  Fact Sheet for Healthcare Providers: https://www.young.biz/  This test is not yet approved or cleared by the [place] FDA and  has been authorized for detection and/or diagnosis of SARS-CoV-2 by  FDA under an Emergency Use Authorization (EUA). This EUA will remain  in effect (meaning this test can be used) for the duration of the  Covid-19 declaration under Section 564(b)(1) of the Act, 21  U.S.C. section 360bbb-3(b)(1), unless the authorization is  terminated or revoked. Performed at [facility], [address] [place], [place] [address]   Culture, Urine     Status: Abnormal   Collection Time: 07/11/20  5:48 PM   Specimen: Urine, Clean Catch  Result Value Ref Range Status   Specimen Description   Final    URINE, CLEAN CATCH Performed at [facility], [address], [place], [place] [address]    Special Requests    Final    NONE Performed at [facility], [address] [place], [place] [address]    Culture MULTIPLE SPECIES PRESENT, SUGGEST RECOLLECTION (A)  Final   Report Status 07/13/2020 FINAL  Final     Scheduled Meds: . clonazePAM  1 mg Oral QHS  . escitalopram  10 mg Oral QHS  . montelukast  10 mg Oral QHS  . [START ON 07/15/2020] pantoprazole  40 mg Intravenous Q12H  . [START ON 07/14/2020] simvastatin  10 mg Oral Once per day on Mon Thu   Continuous Infusions: . sodium chloride 100 mL/hr at 07/12/20 0102  . methocarbamol (ROBAXIN) IV    . pantoprozole (PROTONIX) infusion 8 mg/hr (07/13/20 0110)     LOS: 2 days   Lonia Blood, MD [facility]  [phone] Pager - Text Page per Amion  If 7PM-7AM, please contact night-coverage per Amion 07/13/2020, 10:19 AM

## 2020-07-13 NOTE — Anesthesia Preprocedure Evaluation (Addendum)
Anesthesia Evaluation  Patient identified by MRN, date of birth, ID band Patient awake    Reviewed: Allergy & Precautions, NPO status , Patient's Chart, lab work & pertinent test results  History of Anesthesia Complications Negative for: history of anesthetic complications  Airway Mallampati: II  TM Distance: >3 FB Neck ROM: Full    Dental   Pulmonary asthma ,    Pulmonary exam normal        Cardiovascular negative cardio ROS Normal cardiovascular exam     Neuro/Psych Anxiety Depression negative neurological ROS     GI/Hepatic Neg liver ROS, hiatal hernia, GERD  ,Suspected GIB   Endo/Other  negative endocrine ROS  Renal/GU negative Renal ROS  negative genitourinary   Musculoskeletal negative musculoskeletal ROS (+)   Abdominal   Peds  Hematology negative hematology ROS (+)   Anesthesia Other Findings   Reproductive/Obstetrics                            Anesthesia Physical Anesthesia Plan  ASA: II  Anesthesia Plan: MAC   Post-op Pain Management:    Induction: Intravenous  PONV Risk Score and Plan: 2 and Propofol infusion, TIVA and Treatment may vary due to age or medical condition  Airway Management Planned: Natural Airway, Nasal Cannula and Simple Face Mask  Additional Equipment: None  Intra-op Plan:   Post-operative Plan:   Informed Consent: I have reviewed the patients History and Physical, chart, labs and discussed the procedure including the risks, benefits and alternatives for the proposed anesthesia with the patient or authorized representative who has indicated his/her understanding and acceptance.       Plan Discussed with:   Anesthesia Plan Comments:        Anesthesia Quick Evaluation

## 2020-07-13 NOTE — Interval H&P Note (Signed)
History and Physical Interval Note: For upper endoscopy and colonoscopy today to evaluate acute GI bleeding.  Felt most likely to be lower however will exclude upper source.  Nuclear bleeding scan suggested right lower quadrant source but not brisk enough for angiography. The nature of the procedure, as well as the risks, benefits, and alternatives were carefully and thoroughly reviewed with the patient. Ample time for discussion and questions allowed. The patient understood, was satisfied, and agreed to proceed.   CBC Latest Ref Rng & Units 07/13/2020 07/13/2020 07/12/2020  WBC 4.0 - 10.5 K/uL - - -  Hemoglobin 12.0 - 15.0 g/dL 02.1 11.5 10.6(L)  Hematocrit 36 - 46 % 36.5 37.6 32.2(L)  Platelets 150 - 400 K/uL - - -   Lab Results  Component Value Date   INR 1.1 07/12/2020     07/13/2020 2:16 PM  Kaylee Pratt  has presented today for surgery, with the diagnosis of Gastrointestinal bleeding.  The various methods of treatment have been discussed with the patient and family. After consideration of risks, benefits and other options for treatment, the patient has consented to  Procedure(s): COLONOSCOPY WITH PROPOFOL (N/A) ESOPHAGOGASTRODUODENOSCOPY (EGD) WITH PROPOFOL (N/A) as a surgical intervention.  The patient's history has been reviewed, patient examined, no change in status, stable for surgery.  I have reviewed the patient's chart and labs.  Questions were answered to the patient's satisfaction.     [name]

## 2020-07-14 ENCOUNTER — Encounter: Payer: Self-pay | Admitting: Family Medicine

## 2020-07-14 ENCOUNTER — Encounter (HOSPITAL_COMMUNITY): Payer: Self-pay | Admitting: Internal Medicine

## 2020-07-14 DIAGNOSIS — D62 Acute posthemorrhagic anemia: Secondary | ICD-10-CM

## 2020-07-14 DIAGNOSIS — R55 Syncope and collapse: Secondary | ICD-10-CM | POA: Diagnosis not present

## 2020-07-14 DIAGNOSIS — K921 Melena: Secondary | ICD-10-CM | POA: Diagnosis not present

## 2020-07-14 DIAGNOSIS — K625 Hemorrhage of anus and rectum: Secondary | ICD-10-CM | POA: Diagnosis not present

## 2020-07-14 DIAGNOSIS — K5791 Diverticulosis of intestine, part unspecified, without perforation or abscess with bleeding: Secondary | ICD-10-CM

## 2020-07-14 LAB — TYPE AND SCREEN
ABO/RH(D): B POS
Antibody Screen: POSITIVE
Unit division: 0
Unit division: 0
Unit division: 0

## 2020-07-14 LAB — BASIC METABOLIC PANEL
Anion gap: 12 (ref 5–15)
Anion gap: 8 (ref 5–15)
BUN: 6 mg/dL — ABNORMAL LOW (ref 8–23)
BUN: 8 mg/dL (ref 8–23)
CO2: 21 mmol/L — ABNORMAL LOW (ref 22–32)
CO2: 22 mmol/L (ref 22–32)
Calcium: 7.7 mg/dL — ABNORMAL LOW (ref 8.9–10.3)
Calcium: 8 mg/dL — ABNORMAL LOW (ref 8.9–10.3)
Chloride: 106 mmol/L (ref 98–111)
Chloride: 111 mmol/L (ref 98–111)
Creatinine, Ser: 0.5 mg/dL (ref 0.44–1.00)
Creatinine, Ser: 0.62 mg/dL (ref 0.44–1.00)
GFR, Estimated: >60 mL/min (ref >60)
GFR, Estimated: >60 mL/min (ref >60)
Glucose, Bld: 110 mg/dL — ABNORMAL HIGH (ref 70–99)
Glucose, Bld: 115 mg/dL — ABNORMAL HIGH (ref 70–99)
Potassium: 2.6 mmol/L — CL (ref 3.5–5.1)
Potassium: 4 mmol/L (ref 3.5–5.1)
Sodium: 139 mmol/L (ref 135–145)
Sodium: 141 mmol/L (ref 135–145)

## 2020-07-14 LAB — CBC
HCT: 33.2 % — ABNORMAL LOW (ref 36.0–46.0)
Hemoglobin: 11.1 g/dL — ABNORMAL LOW (ref 12.0–15.0)
MCH: 29.4 pg (ref 26.0–34.0)
MCHC: 33.4 g/dL (ref 30.0–36.0)
MCV: 87.8 fL (ref 80.0–100.0)
Platelets: 183 10*3/uL (ref 150–400)
RBC: 3.78 MIL/uL — ABNORMAL LOW (ref 3.87–5.11)
RDW: 15 % (ref 11.5–15.5)
WBC: 8.9 10*3/uL (ref 4.0–10.5)
nRBC: 0 % (ref 0.0–0.2)

## 2020-07-14 LAB — BPAM RBC
Blood Product Expiration Date: 202111202359
Blood Product Expiration Date: 202111252359
Blood Product Expiration Date: 202112022359
ISSUE DATE / TIME: 202111021221
ISSUE DATE / TIME: 202111021632
ISSUE DATE / TIME: 202111030032
Unit Type and Rh: 7300
Unit Type and Rh: 7300
Unit Type and Rh: 7300

## 2020-07-14 LAB — MAGNESIUM: Magnesium: 2 mg/dL (ref 1.7–2.4)

## 2020-07-14 MED ORDER — POTASSIUM CHLORIDE CRYS ER 20 MEQ PO TBCR: 40.0000 meq | EXTENDED_RELEASE_TABLET | Freq: Three times a day (TID) | ORAL | Status: DC

## 2020-07-14 MED ORDER — OXYCODONE HCL 5 MG PO TABS: 5.0000 mg | ORAL_TABLET | ORAL | Status: DC | PRN

## 2020-07-14 MED ORDER — POTASSIUM CHLORIDE CRYS ER 20 MEQ PO TBCR
40.0000 meq | EXTENDED_RELEASE_TABLET | Freq: Three times a day (TID) | ORAL | Status: DC
  Administered 2020-07-14: 40 meq via ORAL
  Filled 2020-07-14: qty 2

## 2020-07-14 MED ORDER — POTASSIUM CHLORIDE CRYS ER 20 MEQ PO TBCR
30.0000 meq | EXTENDED_RELEASE_TABLET | Freq: Three times a day (TID) | ORAL | Status: DC
  Administered 2020-07-14 (×2): 30 meq via ORAL
  Filled 2020-07-14 (×2): qty 1

## 2020-07-14 MED ORDER — TRAMADOL HCL 50 MG PO TABS: 50.0000 mg | ORAL_TABLET | Freq: Four times a day (QID) | ORAL | Status: DC | PRN

## 2020-07-14 MED ORDER — ACETAMINOPHEN 325 MG PO TABS: 650.0000 mg | ORAL_TABLET | Freq: Four times a day (QID) | ORAL | Status: DC | PRN

## 2020-07-14 MED ORDER — TRAMADOL HCL 50 MG PO TABS: 50.0000 mg | ORAL_TABLET | Freq: Four times a day (QID) | ORAL | 0 refills | Status: DC | PRN

## 2020-07-14 NOTE — Progress Notes (Addendum)
Progress Note  Chief Complaint:    GI bleed     ASSESSMENT / PLAN:    # GI bleed most likely right sided diverticular hemorrhage based on bleeding scan results and colonoscopy findings yesterday. No further bleeding.  --GI will sign off. Please call with any questions  # Acute blood loss anemia.  --Hgb improved to 11.1 post 2 u PRBC  # Hypokalemia, K+ 2.6 --Repletion already in progress  # Right clavicular fracture. Minimally displaced right nasal bone fracture post fall   # Cholelithiasis, seems to be asymptomatic at this point.    # GERD, on Prilosec      EGD 07/14/20  - 2 cm hiatal hernia and non-bleeding duodenal diverticulum  Colonoscopy 07/14/20 -Perianal skin tags found on perianal exam. - The examined portion of the ileum was normal. - Blood in the entire examined colon. Cleared with copious irrigation and lavage. - Diverticulosis in the ascending colon without active bleeding during this study. This is the most likely source of recent GI bleeding. - Very small and nonbleeding internal hemorrhoids. - No specimens collected.      SUBJECTIVE:   Feels okay today. She inquires about long term treatment of fractured clavicle. No GI bleeding or BMs today    OBJECTIVE:    Scheduled inpatient medications:  . clonazePAM  1 mg Oral QHS  . escitalopram  10 mg Oral QHS  . montelukast  10 mg Oral QHS  . potassium chloride  30 mEq Oral TID  . simvastatin  10 mg Oral Once per day on Mon Thu   Continuous inpatient infusions:  . sodium chloride 75 mL/hr at 07/13/20 1937  . methocarbamol (ROBAXIN) IV     PRN inpatient medications: acetaminophen **OR** [DISCONTINUED] acetaminophen, albuterol, famotidine, methocarbamol (ROBAXIN) IV, morphine injection, ondansetron **OR** ondansetron (ZOFRAN) IV  Vital signs in last 24 hours: Temp:  [98.5 F (36.9 C)-99.4 F (37.4 C)] 98.6 F (37 C) (11/04 0428) Pulse Rate:  [75-82] 81 (11/04 0428) Resp:  [15-29] 15 (11/04  0428) BP: (91-160)/(44-111) 123/60 (11/04 0428) SpO2:  [86 %-100 %] 91 % (11/04 0428) Last BM Date: 07/11/20  Intake/Output Summary (Last 24 hours) at 07/14/2020 0904 Last data filed at 07/14/2020 0438 Gross per 24 hour  Intake 500 ml  Output 1300 ml  Net -800 ml     Physical Exam:  . General: Alert, female in NAD . Heart:  Regular rate and rhythm.  No lower extremity edema . Pulmonary: Normal respiratory effort . Abdomen: Soft, nondistended, Nontender. Normal bowel sounds. No masses felt. . Neurologic: Alert and oriented . Psych: Pleasant. Cooperative.   Filed Weights   07/11/20 1456  Weight: 58.5 kg    Intake/Output from previous day: 11/03 0701 - 11/04 0700 In: 500 [I.V.:500] Out: 1300 [Urine:1300] Intake/Output this shift: No intake/output data recorded.    Lab Results: Recent Labs    07/11/20 2115 07/12/20 0343 07/12/20 0854 07/12/20 2112 07/13/20 1019 07/13/20 1729 07/14/20 0344  WBC 10.0  --  6.9  --   --   --  8.9  HGB 9.1*   < > 7.1*   < > 12.2 11.5* 11.1*  HCT 28.2*   < > 22.1*   < > 36.5 35.0* 33.2*  PLT 248  --  214  --   --   --  183   < > = values in this interval not displayed.   BMET Recent Labs    07/11/20 1500 07/12/20 0343 07/14/20 0344  NA 136 137 139  K 4.5 4.3 2.6*  CL 104 105 106  CO2 21* 22 21*  GLUCOSE 177* 103* 110*  BUN 22 14 6*  CREATININE 0.77 0.65 0.50  CALCIUM 8.5* 8.3* 7.7*   LFT Recent Labs    07/12/20 0343  PROT 5.9*  ALBUMIN 3.2*  AST 20  ALT 25  ALKPHOS 45  BILITOT 0.8   PT/INR Recent Labs    07/12/20 0343  LABPROT 13.6  INR 1.1   Hepatitis Panel No results for input(s): HEPBSAG, HCVAB, HEPAIGM, HEPBIGM in the last 72 hours.  NM GI Blood Loss  Result Date: 07/12/2020 CLINICAL DATA:  Fresh rectal bleeding. EXAM: NUCLEAR MEDICINE GASTROINTESTINAL BLEEDING SCAN TECHNIQUE: Sequential abdominal images were obtained following intravenous administration of Tc-13m labeled red blood cells.  RADIOPHARMACEUTICALS:  21.3 mCi Tc-57m pertechnetate in-vitro labeled red cells. COMPARISON:  CT abdomen without contrast 07/11/2020 FINDINGS: During the second hour of imaging, there is faint activity in the RIGHT lower quadrant. This activity is subtle and difficult to tell if it localized to the large or small bowel. This could represent a small focus of trace GI bleeding or oozing. The very small quantity of radiotracer is likely too be small a volume to be detected by angiography.w IMPRESSION: Very subtle focus of potential gastrointestinal bleeding in the RIGHT lower quadrant as described above. Electronically Signed   By: Genevive Bi M.D.   On: 07/12/2020 16:16      Active Problems:   GI hemorrhage   Hematochezia   Acute blood loss anemia     LOS: 3 days   Willette Cluster ,NP 07/14/2020, 9:04 AM     Attending physician's note   I have taken an interval history, reviewed the chart and examined the patient. I agree with the Advanced Practitioner's note, impression and recommendations.   Likely right sided diverticular bleed (resolved). Hb stable.  GERD with HH  Plan: -Advance diet. -Avoid nonsteroidals -Continue omeprazole -We will sign off for now  Edman Circle, MD  GI.

## 2020-07-14 NOTE — Evaluation (Addendum)
Physical Therapy Evaluation Patient Details Name: Kaylee Pratt MRN: [id number] DOB: March 23, 1956 Today's Date: 07/14/2020   History of Present Illness  Pt is 64 yo female with hx of osteoporosis, HLD, HH, GERD, depression, and asthma.  Pt presented with bright red stools and syncope.  Pt admitted with GIB and is s/p colonscopy and upper GI endoscopy.  Bleeding has stabilized and GI signed off.  Pt also with R nasal bone fx and R clavicular fx.  Ortho consulted and recommended sling, outpt follow-up to discuss sx vs conservative tx, and free movement of hand and elbow.  Clinical Impression  Pt admitted with above diagnosis. Pt was able to transfer with min A due to clavicle pain and ambulated 400' in the hall with supervision.  Her orthostatic BP was stable and no dizziness or lightheadedness.  Spent increased time educating on safe ROM, dressing techniques, HEP, and sling don/doffing.   Pt does have family support and no DME needs.  Overall demonstrates mobility necessary to return home from PT perspective, but if remains hospitalized will benefit from PT for further transfer training, therapeutic exercises, and sling management education. Pt currently with functional limitations due to the deficits listed below (see PT Problem List). Pt will benefit from skilled PT to increase their independence and safety with mobility to allow discharge to the venue listed below.     Orthostatic BPs  Supine 145/67  Sitting 137/78     Standing 125/82  No syncopal symptoms    Follow Up Recommendations No PT follow up;Supervision - Intermittent    Equipment Recommendations  None recommended by PT    Recommendations for Other Services       Precautions / Restrictions Precautions Precautions: None Required Braces or Orthoses: Sling Restrictions Weight Bearing Restrictions: Yes RUE Weight Bearing: Non weight bearing      Mobility  Bed Mobility Overal bed mobility: Needs Assistance Bed Mobility:  Supine to Sit     Supine to sit: Min assist     General bed mobility comments: Min A to lift trunk due to clavicular pain    Transfers Overall transfer level: Needs assistance   Transfers: Sit to/from Stand Sit to Stand: Min guard         General transfer comment: min guard for safety  Ambulation/Gait Ambulation/Gait assistance: Supervision Gait Distance (Feet): 400 Feet Assistive device: None Gait Pattern/deviations: Step-through pattern     General Gait Details: Cautious gait speed but steady and without LOB  Stairs            Wheelchair Mobility    Modified Rankin (Stroke Patients Only)       Balance Overall balance assessment: Needs assistance Sitting-balance support: No upper extremity supported Sitting balance-Leahy Scale: Good     Standing balance support: No upper extremity supported Standing balance-Leahy Scale: Good                               Pertinent Vitals/Pain Pain Assessment: 0-10 Pain Score: 5  Pain Location: R shoulder Pain Descriptors / Indicators: Discomfort Pain Intervention(s): Limited activity within patient's tolerance;Monitored during session;Relaxation;Repositioned    Home Living Family/patient expects to be discharged to:: Private residence Living Arrangements: Alone (alone but is going home w ex husband where she will have assist) Available Help at Discharge: Family;Available 24 hours/day Type of Home: Other(Comment) (condo) Home Access: Elevator     Home Layout: One level Home Equipment: None Additional Comments: info is  on condo    Prior Function Level of Independence: Independent         Comments: Independent and working     Higher education careers adviser        Extremity/Trunk Assessment   Upper Extremity Assessment Upper Extremity Assessment: RUE deficits/detail RUE Deficits / Details: ROM: shoulder-not tested, in sling; hand and elbow - WFL;  MMT: at least 3/5 hand and elbow but not further  tested; Noted edema throughout upper extremity - pt reports she has been keeping the entire UE still.    Lower Extremity Assessment Lower Extremity Assessment: Overall WFL for tasks assessed    Cervical / Trunk Assessment Cervical / Trunk Assessment: Normal  Communication   Communication: No difficulties  Cognition Arousal/Alertness: Awake/alert Behavior During Therapy: WFL for tasks assessed/performed Overall Cognitive Status: Within Functional Limits for tasks assessed                                        General Comments General comments (skin integrity, edema, etc.): Pt and ex were educated on sling placement/adjustment and took pictures of correct placement.  Discussed dressing techniques and recommendations.  Pt educated on and demonstrated AROM of hand/wrist/elbow - encouraged to perform frequently to reduce edema.  Also, discussed could remove sling when awake and resting to allow her to extend elbow.  Recommend positioning with pillow to support arm/shoulder if out of sling.    Exercises General Exercises - Upper Extremity Elbow Flexion: AROM;10 reps;Seated;Right Elbow Extension: AROM;Right;10 reps;Seated Wrist Flexion: AROM;Both;10 reps;Seated Wrist Extension: AROM;Both;10 reps;Seated Digit Composite Flexion: AROM;Both;10 reps;Seated Composite Extension: AROM;Both;10 reps;Seated   Assessment/Plan    PT Assessment Patient needs continued PT services  PT Problem List Decreased strength;Decreased mobility;Decreased range of motion;Decreased knowledge of precautions;Decreased knowledge of use of DME;Pain       PT Treatment Interventions DME instruction;Therapeutic activities;Gait training;Balance training;Functional mobility training;Patient/family education    PT Goals (Current goals can be found in the Care Plan section)  Acute Rehab PT Goals Patient Stated Goal: return home; get upper arm IV out so she can move in bed PT Goal Formulation: With  patient/family Time For Goal Achievement: 07/28/20 Potential to Achieve Goals: Good Additional Goals Additional Goal #1: Pt will be able to don/doff sling    Frequency Min 3X/week   Barriers to discharge        Co-evaluation               AM-PAC PT "6 Clicks" Mobility  Outcome Measure Help needed turning from your back to your side while in a flat bed without using bedrails?: A Little Help needed moving from lying on your back to sitting on the side of a flat bed without using bedrails?: A Little Help needed moving to and from a bed to a chair (including a wheelchair)?: None Help needed standing up from a chair using your arms (e.g., wheelchair or bedside chair)?: None Help needed to walk in hospital room?: None Help needed climbing 3-5 steps with a railing? : A Little 6 Click Score: 21    End of Session Equipment Utilized During Treatment: Other (comment) (sling) Activity Tolerance: Patient tolerated treatment well Patient left: in bed;with call bell/phone within reach;with family/visitor present Nurse Communication: Mobility status PT Visit Diagnosis: Other abnormalities of gait and mobility (R26.89);Pain Pain - Right/Left: Right Pain - part of body: Shoulder    Time: 2353-6144 PT Time Calculation (min) (  ACUTE ONLY): 40 min   Charges:   PT Evaluation $PT Eval Low Complexity: 1 Low PT Treatments $Therapeutic Exercise: 8-22 mins $Therapeutic Activity: 8-22 mins        Anise Salvo, PT Acute Rehab Services Pager [phone] [facility] [phone]    Rayetta Humphrey 07/14/2020, 3:24 PM

## 2020-07-14 NOTE — Plan of Care (Signed)
  Problem: Education: Goal: Ability to identify signs and symptoms of gastrointestinal bleeding will improve Outcome: Adequate for Discharge   Problem: Bowel/Gastric: Goal: Will show no signs and symptoms of gastrointestinal bleeding Outcome: Adequate for Discharge   Problem: Fluid Volume: Goal: Will show no signs and symptoms of excessive bleeding Outcome: Adequate for Discharge   Problem: Clinical Measurements: Goal: Complications related to the disease process, condition or treatment will be avoided or minimized Outcome: Adequate for Discharge   

## 2020-07-14 NOTE — Discharge Summary (Signed)
DISCHARGE SUMMARY  Kaylee Pratt  MR#: [id number]  DOB:08/30/1956  Date of Admission: 07/11/2020 Date of Discharge: 07/14/2020  Attending Physician:Lynnett Langlinais Silvestre Gunner, MD  Patient's YHC:WCBJSE, Helane Rima, MD  Consults:  [facility]   Disposition: D/C home   Follow-up Appts:  Follow-up Information    Sheliah Hatch, MD Follow up in 1 week(s).   Specialty: Family Medicine Contact information: [id number] [place] [address] [phone]               Tests Needing Follow-up: -check Hgb as outpt  -determine if resumption of prior daily ASA is indicated   Discharge Diagnoses: Acute GI bleed IV contrast allergy Aute symptomatic anemia of acute blood loss Syncope Comminuted displaced fracture of R clavicle GERD Depression  Initial presentation: 64yo with a history of osteoporosis, HLD, HH, GERD, depression, asthma, and known internal hemorrhoids who presented to the ER with bright red blood per rectum and a syncopal spell occurring the day of admission.  She reported 2 large bloody bowel movements with abdominal discomfort but no nausea or vomiting.  She attempted to stand from a sitting position and immediately felt her right side, resulting in a fracture of her nasal bone and right clavicle.  She was admitted through the [facility] ED with lower GI bleeding and traumatic fractures.  Hospital Course:  Acute GI bleed nuclear medicine bleeding scan suggestive of possible right lower quadrant source -colonoscopy 11/3 consistent with diverticular bleeding - EGD without evidence of upper GI bleeding - hemodynamically stable at time of d/c and passing normal stool w/o evidence of further bleeding - counseled on need to increase fiber in diet/add fiber supplement - advised to avoid NSAIDs  Severe hypokalemia Transient due to GI losses/prep and poor intake - corrected prior to d/c   IV contrast allergy  Acute symptomatic anemia of acute blood loss Status post 3  units PRBC this admit - hemoglobin stabilized at time of d/c home   Syncope Due to hypovolemia/acute blood loss - stable on feet at time of d/c - did well w/ PT eval   Comminuted displaced fracture of R clavicle Management per Orthopedic Surgery -continue sling -okay for free movement of elbow and wrist -ice and medication for pain as desired -to follow-up with Dr. Ave Filter at [facility]  GERD Continue usual PPI  Depression Continue usual Lexapro   Allergies as of 07/14/2020      Reactions   Amoxil [amoxicillin] Rash   Iohexol Hives   Pt developed hives on her face and chest after IV contrast       Medication List    STOP taking these medications   aspirin EC 81 MG tablet   montelukast 10 MG tablet Commonly known as: SINGULAIR     TAKE these medications   acetaminophen 325 MG tablet Commonly known as: TYLENOL Take 2 tablets (650 mg total) by mouth every 6 (six) hours as needed for mild pain (or Fever >/= 101).   albuterol 108 (90 Base) MCG/ACT inhaler Commonly known as: VENTOLIN HFA Inhale 2 puffs into the lungs every 6 (six) hours as needed for wheezing or shortness of breath.   cholecalciferol 1000 units tablet Commonly known as: VITAMIN D Take 2,000 Units by mouth daily.   clonazePAM 1 MG tablet Commonly known as: KLONOPIN 1 tab po BID prn anxiety What changed:   how much to take  how to take this  when to take this  additional instructions   escitalopram 20 MG  tablet Commonly known as: LEXAPRO TAKE 1 TABLET BY MOUTH EVERY DAY What changed:   how much to take  when to take this   omeprazole 40 MG capsule Commonly known as: PRILOSEC Take 1 capsule (40 mg total) by mouth daily. What changed:   when to take this  reasons to take this   simvastatin 20 MG tablet Commonly known as: ZOCOR TAKE 1 TABLET BY MOUTH EVERYDAY AT BEDTIME What changed:   how much to take  how to take this  when to take this  additional  instructions   traMADol 50 MG tablet Commonly known as: ULTRAM Take 1 tablet (50 mg total) by mouth every 6 (six) hours as needed for severe pain.       Day of Discharge BP 139/67 (BP Location: Left Arm)   Pulse 81   Temp 98 F (36.7 C) (Oral)   Resp 20   Ht 5' (1.524 m)   Wt 58.5 kg   SpO2 94%   BMI 25.19 kg/m   Physical Exam: General: No acute respiratory distress Lungs: Clear to auscultation bilaterally without wheezes or crackles Cardiovascular: Regular rate and rhythm without murmur gallop or rub normal S1 and S2 Abdomen: Nontender, nondistended, soft, bowel sounds positive, no rebound, no ascites, no appreciable mass Extremities: No significant cyanosis, clubbing, or edema bilateral lower extremities  Basic Metabolic Panel: Recent Labs  Lab 07/11/20 1500 07/12/20 0343 07/14/20 0344 07/14/20 1426  NA 136 137 139 141  K 4.5 4.3 2.6* 4.0  CL 104 105 106 111  CO2 21* 22 21* 22  GLUCOSE 177* 103* 110* 115*  BUN 22 14 6* 8  CREATININE 0.77 0.65 0.50 0.62  CALCIUM 8.5* 8.3* 7.7* 8.0*  MG  --  1.9  --  2.0    Liver Function Tests: Recent Labs  Lab 07/11/20 1500 07/12/20 0343  AST 30 20  ALT 32 25  ALKPHOS 58 45  BILITOT 0.9 0.8  PROT 7.3 5.9*  ALBUMIN 3.9 3.2*    Recent Labs  Lab 07/11/20 1606  AMMONIA 27    Coags: Recent Labs  Lab 07/12/20 0343  INR 1.1    CBC: Recent Labs  Lab 07/11/20 1500 07/11/20 1500 07/11/20 2115 07/12/20 0343 07/12/20 0854 07/12/20 0854 07/12/20 2112 07/13/20 0405 07/13/20 1019 07/13/20 1729 07/14/20 0344  WBC 12.6*  --  10.0  --  6.9  --   --   --   --   --  8.9  NEUTROABS 10.2*  --   --   --  4.5  --   --   --   --   --   --   HGB 11.3*   < > 9.1*   < > 7.1*   < > 10.6* 12.3 12.2 11.5* 11.1*  HCT 36.3   < > 28.2*   < > 22.1*   < > 32.2* 37.6 36.5 35.0* 33.2*  MCV 93.8  --  92.2  --  95.3  --   --   --   --   --  87.8  PLT 304  --  248  --  214  --   --   --   --   --  183   < > = values in this  interval not displayed.    CBG: Recent Labs  Lab 07/11/20 1505  GLUCAP 165*    Recent Results (from the past 240 hour(s))  Respiratory Panel by RT PCR (Flu A&B, Covid) - Nasopharyngeal  Swab     Status: None   Collection Time: 07/11/20  4:06 PM   Specimen: Nasopharyngeal Swab  Result Value Ref Range Status   SARS Coronavirus 2 by RT PCR NEGATIVE NEGATIVE Final    Comment: (NOTE) SARS-CoV-2 target nucleic acids are NOT DETECTED.  The SARS-CoV-2 RNA is generally detectable in upper respiratoy specimens during the acute phase of infection. The lowest concentration of SARS-CoV-2 viral copies this assay can detect is 131 copies/mL. A negative result does not preclude SARS-Cov-2 infection and should not be used as the sole basis for treatment or other patient management decisions. A negative result may occur with  improper specimen collection/handling, submission of specimen other than nasopharyngeal swab, presence of viral mutation(s) within the areas targeted by this assay, and inadequate number of viral copies (<131 copies/mL). A negative result must be combined with clinical observations, patient history, and epidemiological information. The expected result is Negative.  Fact Sheet for Patients:  https://www.moore.com/  Fact Sheet for Healthcare Providers:  https://www.young.biz/  This test is no t yet approved or cleared by the [place] FDA and  has been authorized for detection and/or diagnosis of SARS-CoV-2 by FDA under an Emergency Use Authorization (EUA). This EUA will remain  in effect (meaning this test can be used) for the duration of the COVID-19 declaration under Section 564(b)(1) of the Act, 21 U.S.C. section 360bbb-3(b)(1), unless the authorization is terminated or revoked sooner.     Influenza A by PCR NEGATIVE NEGATIVE Final   Influenza B by PCR NEGATIVE NEGATIVE Final    Comment: (NOTE) The Xpert Xpress  SARS-CoV-2/FLU/RSV assay is intended as an aid in  the diagnosis of influenza from Nasopharyngeal swab specimens and  should not be used as a sole basis for treatment. Nasal washings and  aspirates are unacceptable for Xpert Xpress SARS-CoV-2/FLU/RSV  testing.  Fact Sheet for Patients: https://www.moore.com/  Fact Sheet for Healthcare Providers: https://www.young.biz/  This test is not yet approved or cleared by the [place] FDA and  has been authorized for detection and/or diagnosis of SARS-CoV-2 by  FDA under an Emergency Use Authorization (EUA). This EUA will remain  in effect (meaning this test can be used) for the duration of the  Covid-19 declaration under Section 564(b)(1) of the Act, 21  U.S.C. section 360bbb-3(b)(1), unless the authorization is  terminated or revoked. Performed at [facility], [address] [place], [place] [address]   Culture, Urine     Status: Abnormal   Collection Time: 07/11/20  5:48 PM   Specimen: Urine, Clean Catch  Result Value Ref Range Status   Specimen Description   Final    URINE, CLEAN CATCH Performed at [facility], [address], [place], [place] [address]    Special Requests   Final    NONE Performed at [facility], [address], [place], [place] [address]    Culture MULTIPLE SPECIES PRESENT, SUGGEST RECOLLECTION (A)  Final   Report Status 07/13/2020 FINAL  Final      Time spent in discharge (includes decision making & examination of pt): 35 minutes  07/14/2020, 4:04 PM   Lonia Blood, MD [facility] Office  [phone]

## 2020-07-14 NOTE — Discharge Instructions (Signed)
Lower Gastrointestinal Bleeding  Lower gastrointestinal (GI) bleeding is the result of bleeding from the colon, rectum, or anal area. The colon is the last part of the digestive tract, where stool, also called feces, is formed. If you have lower GI bleeding, you may see blood in or on your stool. It may be bright red. Lower GI bleeding often stops without treatment. Continued or heavy bleeding needs emergency treatment at the hospital. What are the causes? Lower GI bleeding may be caused by:  A condition that causes pouches to form in the colon over time (diverticulosis).  Swelling and irritation (inflammation) in areas with diverticulosis (diverticulitis).  Inflammation of the colon (inflammatory bowel disease).  Swollen veins in the rectum (hemorrhoids).  Painful tears in the anus (anal fissures), often caused by passing hard stools.  Cancer of the colon or rectum.  Noncancerous growths (polyps) of the colon or rectum.  A bleeding disorder that impairs the formation of blood clots and causes easy bleeding (coagulopathy).  An abnormal weakening of a blood vessel where an artery and a vein come together (arteriovenous malformation). What increases the risk? You are more likely to develop this condition if:  You are older than 63 years of age.  You take aspirin or NSAIDs on a regular basis.  You take anticoagulant or antiplatelet drugs.  You have a history of high-dose X-ray treatment (radiation therapy) of the colon.  You recently had a colon polyp removed. What are the signs or symptoms? Symptoms of this condition include:  Bright red blood or blood clots coming from your rectum.  Bloody stools.  Black or maroon-colored stools.  Pain or cramping in the abdomen.  Weakness or dizziness.  Racing heartbeat. How is this diagnosed? This condition may be diagnosed based on:  Your symptoms and medical history.  A physical exam. During the exam, your health care  provider will check for signs of blood loss, such as low blood pressure and a rapid pulse.  Tests, such as: ? Flexible sigmoidoscopy. In this procedure, a flexible tube with a camera on the end is used to examine your anus and the first part of your colon to look for the source of bleeding. ? Colonoscopy. This is similar to a flexible sigmoidoscopy, but the camera can extend all the way to the uppermost part of your colon. ? Blood tests to measure your red blood cell count and to check for coagulopathy. ? An imaging study of your colon to look for a bleeding site. In some cases, you may have X-rays taken after a dye or radioactive substance is injected into your bloodstream (angiogram). How is this treated? Treatment for this condition depends on the cause of the bleeding. Heavy or persistent bleeding is treated at the hospital. Treatment may include:  Getting fluids through an IV tube inserted into one of your veins.  Getting blood through an IV tube (blood transfusion).  Stopping bleeding through high-heat coagulation, injections of certain medicines, or applying surgical clips. This can all be done during a colonoscopy.  Having a procedure that involves first doing an angiogram and then blocking blood flow to the bleeding site (embolization).  Stopping some of your regular medicines for a certain amount of time.  Having surgery to remove part of the colon. This may be needed if bleeding is severe and does not respond to other treatment. Follow these instructions at home:  Take over-the-counter and prescription medicines only as told by your health care provider. You may need to  avoid aspirin, NSAIDs, or other medicines that increase bleeding.  Eat foods that are high in fiber. This will help keep your stools soft. These foods include whole grains, legumes, fruits, and vegetables. Eating 1-3 prunes each day works well for many people.  Drink enough fluid to keep your urine clear or pale  yellow.  Keep all follow-up visits as told by your health care provider. This is important. Contact a health care provider if:  Your symptoms do not improve. Get help right away if:  Your bleeding increases.  You feel light-headed or you faint.  You feel weak.  You have severe cramps in your back or abdomen.  You pass large blood clots in your stool.  Your symptoms get worse. This information is not intended to replace advice given to you by your health care provider. Make sure you discuss any questions you have with your health care provider. Document Revised: 12/19/2018 Document Reviewed: 01/12/2016 Elsevier Patient Education  2020 [employer].   Diverticulitis  Diverticulitis is infection or inflammation of small pouches (diverticula) in the colon that form due to a condition called diverticulosis. Diverticula can trap stool (feces) and bacteria, causing infection and inflammation. Diverticulitis may cause severe stomach pain and diarrhea. It may lead to tissue damage in the colon that causes bleeding. The diverticula may also burst (rupture) and cause infected stool to enter other areas of the abdomen. Complications of diverticulitis can include:  Bleeding.  Severe infection.  Severe pain.  Rupture (perforation) of the colon.  Blockage (obstruction) of the colon. What are the causes? This condition is caused by stool becoming trapped in the diverticula, which allows bacteria to grow in the diverticula. This leads to inflammation and infection. What increases the risk? You are more likely to develop this condition if:  You have diverticulosis. The risk for diverticulosis increases if: ? You are overweight or obese. ? You use tobacco products. ? You do not get enough exercise.  You eat a diet that does not include enough fiber. High-fiber foods include fruits, vegetables, beans, nuts, and whole grains. What are the signs or symptoms? Symptoms of this condition  may include:  Pain and tenderness in the abdomen. The pain is normally located on the left side of the abdomen, but it may occur in other areas.  Fever and chills.  Bloating.  Cramping.  Nausea.  Vomiting.  Changes in bowel routines.  Blood in your stool. How is this diagnosed? This condition is diagnosed based on:  Your medical history.  A physical exam.  Tests to make sure there is nothing else causing your condition. These tests may include: ? Blood tests. ? Urine tests. ? Imaging tests of the abdomen, including X-rays, ultrasounds, MRIs, or CT scans. How is this treated? Most cases of this condition are mild and can be treated at home. Treatment may include:  Taking over-the-counter pain medicines.  Following a clear liquid diet.  Taking antibiotic medicines by mouth.  Rest. More severe cases may need to be treated at a hospital. Treatment may include:  Not eating or drinking.  Taking prescription pain medicine.  Receiving antibiotic medicines through an IV tube.  Receiving fluids and nutrition through an IV tube.  Surgery. When your condition is under control, your health care provider may recommend that you have a colonoscopy. This is an exam to look at the entire large intestine. During the exam, a lubricated, bendable tube is inserted into the anus and then passed into the rectum, colon,  other parts of the large intestine. A colonoscopy can show how severe your diverticula are and whether something else may be causing your symptoms. Follow these instructions at home: Medicines  Take over-the-counter and prescription medicines only as told by your health care provider. These include fiber supplements, probiotics, and stool softeners.  If you were prescribed an antibiotic medicine, take it as told by your health care provider. Do not stop taking the antibiotic even if you start to feel better.  Do not drive or use heavy machinery while taking  prescription pain medicine. General instructions   Follow a full liquid diet or another diet as directed by your health care provider. After your symptoms improve, your health care provider may tell you to change your diet. He or she may recommend that you eat a diet that contains at least 25 g (25 grams) of fiber daily. Fiber makes it easier to pass stool. Healthy sources of fiber include: ? Berries. One cup contains 4-8 grams of fiber. ? Beans or lentils. One half cup contains 5-8 grams of fiber. ? Green vegetables. One cup contains 4 grams of fiber.  Exercise for at least 30 minutes, 3 times each week. You should exercise hard enough to raise your heart rate and break a sweat.  Keep all follow-up visits as told by your health care provider. This is important. You may need a colonoscopy. Contact a health care provider if:  Your pain does not improve.  You have a hard time drinking or eating food.  Your bowel movements do not return to normal. Get help right away if:  Your pain gets worse.  Your symptoms do not get better with treatment.  Your symptoms suddenly get worse.  You have a fever.  You vomit more than one time.  You have stools that are bloody, black, or tarry. Summary  Diverticulitis is infection or inflammation of small pouches (diverticula) in the colon that form due to a condition called diverticulosis. Diverticula can trap stool (feces) and bacteria, causing infection and inflammation.  You are at higher risk for this condition if you have diverticulosis and you eat a diet that does not include enough fiber.  Most cases of this condition are mild and can be treated at home. More severe cases may need to be treated at a hospital.  When your condition is under control, your health care provider may recommend that you have an exam called a colonoscopy. This exam can show how severe your diverticula are and whether something else may be causing your symptoms. This  information is not intended to replace advice given to you by your health care provider. Make sure you discuss any questions you have with your health care provider. Document Revised: 08/09/2017 Document Reviewed: 09/29/2016 Elsevier Patient Education  2020 [employer]vier Inc.  

## 2020-07-14 NOTE — Progress Notes (Signed)
   07/14/20   To Whom it may concern,  Kaylee Pratt was admitted to [facility] on 07/11/2020 and remained under my care in the hospital through 07/14/2020. She has been advised that she should not return to work until 07/18/2020.  Should you have any questions regarding this information further details can be obtained by calling my office only after consent is provided by the patient herself.   Sincerely,  Lonia Blood, MD [facility] Office  [phone]

## 2020-07-18 ENCOUNTER — Other Ambulatory Visit: Payer: Self-pay

## 2020-07-18 ENCOUNTER — Encounter: Payer: Self-pay | Admitting: Family Medicine

## 2020-07-18 ENCOUNTER — Ambulatory Visit: Payer: BC Managed Care – PPO | Admitting: Family Medicine

## 2020-07-18 VITALS — BP 132/78 | HR 78 | Temp 98.4°F | Resp 20 | Ht 60.0 in | Wt 137.6 lb

## 2020-07-18 DIAGNOSIS — S42021K Displaced fracture of shaft of right clavicle, subsequent encounter for fracture with nonunion: Secondary | ICD-10-CM | POA: Diagnosis not present

## 2020-07-18 DIAGNOSIS — S42001A Fracture of unspecified part of right clavicle, initial encounter for closed fracture: Secondary | ICD-10-CM | POA: Insufficient documentation

## 2020-07-18 DIAGNOSIS — R55 Syncope and collapse: Secondary | ICD-10-CM | POA: Insufficient documentation

## 2020-07-18 DIAGNOSIS — K922 Gastrointestinal hemorrhage, unspecified: Secondary | ICD-10-CM | POA: Diagnosis not present

## 2020-07-18 NOTE — Patient Instructions (Signed)
Follow up as needed or as scheduled Use the pain medication as you need it Continue to ice and wear the sling If you again have bleeding- please call GI ASAP Make sure you are drinking plenty of fluids Change positions slowly- to allow yourself time to adjust Call with any questions or concerns Hang in there!!!

## 2020-07-18 NOTE — Assessment & Plan Note (Signed)
New.  Pt in obvious pain.  Has ortho f/u on Friday and at that time she plans to ask for surgery to repair the visible deformity.  She has pain meds available and is in a sling.  Will follow along.

## 2020-07-18 NOTE — Assessment & Plan Note (Signed)
New.  Due to blood loss/hypotension.  Unfortunately she fractured her face and clavicle when she passed out.  Nasal bone does not require repair but it appears that clavicle will.  Encouraged increased water intake, changing positions slowly.

## 2020-07-18 NOTE — Progress Notes (Signed)
   Subjective:    Patient ID: Kaylee Pratt, female    DOB: 06-14-1956, 64 y.o.   MRN: [id number]  HPI Hospital f/u- pt was admitted 11/1-11/4 due to acute GI bleed, syncope, and comminuted R clavicular fracture.  The day of admission she had 2 large bloody BMs.  She passed out when she attempted to stand- fracturing both her clavicle and nasal bone.  She had a nuclear medicine scan that revealed RLQ source and colonoscopy showed diverticular bleeding.  EGD showed no upper GI bleed.  On admission, potassium was low due to GI losses (2.6)- this was repleted.  She received 3 units PRBCs to stabilize hemoglobin (was 7.1- day of d/c was 11.1).  Pt to f/u w/ Dr Ave Filter for her clavicle- has appt on Friday.  Pt would like to have clavicle repaired.  No blood in stool since hospital d/c.  Pt reports energy is good, denies dizziness unless having severe pain.  BP is normal today.  Reviewed H&P, notes, labs, images, procedures, and D/C summary.   Review of Systems For ROS see HPI   This visit occurred during the SARS-CoV-2 public health emergency.  Safety protocols were in place, including screening questions prior to the visit, additional usage of staff PPE, and extensive cleaning of exam room while observing appropriate contact time as indicated for disinfecting solutions.       Objective:   Physical Exam Vitals reviewed.  Constitutional:      Appearance: Normal appearance.  HENT:     Head: Normocephalic.     Comments: Bruising around R eye due to facial fx Eyes:     Extraocular Movements: Extraocular movements intact.     Conjunctiva/sclera: Conjunctivae normal.     Pupils: Pupils are equal, round, and reactive to light.  Cardiovascular:     Rate and Rhythm: Normal rate and regular rhythm.     Pulses: Normal pulses.     Heart sounds: Normal heart sounds. No murmur heard.   Pulmonary:     Effort: Pulmonary effort is normal. No respiratory distress.     Breath sounds: Normal breath sounds.  No wheezing or rhonchi.  Chest:     Chest wall: Deformity (visibly deformed R clavicle) present.  Musculoskeletal:     Cervical back: Normal range of motion. No rigidity.  Lymphadenopathy:     Cervical: No cervical adenopathy.  Neurological:     Mental Status: She is alert.           Assessment & Plan:

## 2020-07-18 NOTE — Assessment & Plan Note (Signed)
New.  Thought to be a diverticular bleed.  No bleeding since hospital d/c.  Had both upper and lower scopes.  Hgb had recovered after 3 units PRBCs.  Will follow.

## 2020-07-22 ENCOUNTER — Telehealth: Payer: Self-pay

## 2020-07-22 ENCOUNTER — Other Ambulatory Visit: Payer: Self-pay | Admitting: Orthopedic Surgery

## 2020-07-22 DIAGNOSIS — S42021A Displaced fracture of shaft of right clavicle, initial encounter for closed fracture: Secondary | ICD-10-CM | POA: Diagnosis not present

## 2020-07-22 DIAGNOSIS — M25511 Pain in right shoulder: Secondary | ICD-10-CM | POA: Diagnosis not present

## 2020-07-22 DIAGNOSIS — J301 Allergic rhinitis due to pollen: Secondary | ICD-10-CM | POA: Diagnosis not present

## 2020-07-22 NOTE — Patient Instructions (Addendum)
DUE TO COVID-19 ONLY ONE VISITOR IS ALLOWED TO COME WITH YOU AND STAY IN THE WAITING ROOM ONLY DURING PRE OP AND PROCEDURE DAY OF SURGERY. THE 1 VISITOR  MAY VISIT WITH YOU AFTER SURGERY IN YOUR PRIVATE ROOM DURING VISITING HOURS ONLY!  YOU NEED TO HAVE A COVID 19 TEST ON   11/15_ @__2 :50 PM_____, THIS TEST MUST BE DONE BEFORE SURGERY,  COVID TESTING SITE [address] [place] , IT IS ON THE RIGHT GOING OUT [address] APPROXIMATELY  2 MINUTES PAST [employer] ON THE RIGHT. ONCE YOUR COVID TEST IS COMPLETED,  PLEASE BEGIN THE QUARANTINE INSTRUCTIONS AS OUTLINED IN YOUR HANDOUT.                Kaylee Pratt    Your procedure is scheduled on: 07/28/20   Report to [facility] Main  Entrance   Report to admitting at   9:00 AM     Call this number if you have problems the morning of surgery [phone]    Remember: Do not eat food After Midnight.  .  You may have clear liquid until 8:00 AM.    At 8:00  AM drink pre surgery drink.   Nothing by mouth after 8:00  AM.   BRUSH YOUR TEETH MORNING OF SURGERY AND RINSE YOUR MOUTH OUT, NO CHEWING GUM CANDY OR MINTS.     Take these medicines the morning of surgery with A SIP OF WATER: Lexapro, Omeprazole. Use your inhaler and bring with you                                 You may not have any metal on your body including hair pins and              piercings  Do not wear jewelry, make-up, lotions, powders or perfumes, deodorant             Do not wear nail polish on your fingernails.  Do not shave  48 hours prior to surgery.     Do not bring valuables to the hospital. [facility] IS NOT             RESPONSIBLE   FOR VALUABLES.  Contacts, dentures or bridgework may not be worn into surgery.      Patients discharged the day of surgery will not be allowed to drive home.   IF YOU ARE HAVING SURGERY AND GOING HOME THE SAME DAY, YOU MUST HAVE AN ADULT TO DRIVE YOU HOME AND BE WITH YOU FOR 24 HOURS.    YOU MAY GO HOME BY TAXI OR UBER OR ORTHERWISE, BUT AN ADULT MUST ACCOMPANY YOU HOME AND STAY WITH YOU FOR 24 HOURS.  Name and phone number of your driver:  Special Instructions: N/A              Please read over the following fact sheets you were given: _____________________________________________________________________             [facility] - Preparing for Surgery Before surgery, you can play an important role.   Because skin is not sterile, your skin needs to be as free of germs as possible.   You can reduce the number of germs on your skin by washing with CHG (chlorahexidine gluconate) soap before surgery.   CHG is an antiseptic cleaner which kills germs and bonds with the skin to continue killing germs  even after washing. Please DO NOT use if you have an allergy to CHG or antibacterial soaps.   If your skin becomes reddened/irritated stop using the CHG and inform your nurse when you arrive at Short Stay. Do not shave (including legs and underarms) for at least 48 hours prior to the first CHG shower.    Please follow these instructions carefully:  1.  Shower with CHG Soap the night before surgery and the  morning of Surgery.  2.  If you choose to wash your hair, wash your hair first as usual with your  normal  shampoo.  3.  After you shampoo, rinse your hair and body thoroughly to remove the  shampoo.                                        4.  Use CHG as you would any other liquid soap.  You can apply chg directly  to the skin and wash                       Gently with a scrungie or clean washcloth.  5.  Apply the CHG Soap to your body ONLY FROM THE NECK DOWN.   Do not use on face/ open                           Wound or open sores. Avoid contact with eyes, ears mouth and genitals (private parts).                       Wash face,  Genitals (private parts) with your normal soap.             6.  Wash thoroughly, paying special attention to the area where your surgery  will be  performed.  7.  Thoroughly rinse your body with warm water from the neck down.  8.  DO NOT shower/wash with your normal soap after using and rinsing off  the CHG Soap.             9.  Pat yourself dry with a clean towel.            10.  Wear clean pajamas.            11.  Place clean sheets on your bed the night of your first shower and do not  sleep with pets. Day of Surgery : Do not apply any lotions/deodorants the morning of surgery.  Please wear clean clothes to the hospital/surgery center.  FAILURE TO FOLLOW THESE INSTRUCTIONS MAY RESULT IN THE CANCELLATION OF YOUR SURGERY PATIENT SIGNATURE_________________________________  NURSE SIGNATURE__________________________________  ________________________________________________________________________

## 2020-07-22 NOTE — Telephone Encounter (Signed)
Have received surgical clearance from [facility].  PH [phone] FX [phone].  Requesting clearance for Surgery on 07/28/2020.  Patient was last seen by Tabori on 07/18/20 for hospital follow up.  I have placed in provider box.

## 2020-07-25 ENCOUNTER — Other Ambulatory Visit (HOSPITAL_COMMUNITY)
Admission: RE | Admit: 2020-07-25 | Discharge: 2020-07-25 | Disposition: A | Discharge status: Home / Self Care | Payer: BC Managed Care – PPO | Source: Ambulatory Visit | Attending: Orthopedic Surgery | Admitting: Orthopedic Surgery

## 2020-07-25 ENCOUNTER — Encounter (HOSPITAL_COMMUNITY): Payer: Self-pay

## 2020-07-25 ENCOUNTER — Encounter (HOSPITAL_COMMUNITY)
Admission: RE | Admit: 2020-07-25 | Discharge: 2020-07-25 | Disposition: A | Discharge status: Home / Self Care | Payer: BC Managed Care – PPO | Source: Ambulatory Visit | Attending: Orthopedic Surgery | Admitting: Orthopedic Surgery

## 2020-07-25 ENCOUNTER — Other Ambulatory Visit: Payer: Self-pay

## 2020-07-25 DIAGNOSIS — S42022A Displaced fracture of shaft of left clavicle, initial encounter for closed fracture: Secondary | ICD-10-CM | POA: Diagnosis not present

## 2020-07-25 DIAGNOSIS — Z20822 Contact with and (suspected) exposure to covid-19: Secondary | ICD-10-CM | POA: Insufficient documentation

## 2020-07-25 DIAGNOSIS — Z01812 Encounter for preprocedural laboratory examination: Secondary | ICD-10-CM | POA: Insufficient documentation

## 2020-07-25 DIAGNOSIS — W19XXXA Unspecified fall, initial encounter: Secondary | ICD-10-CM | POA: Diagnosis not present

## 2020-07-25 DIAGNOSIS — J3081 Allergic rhinitis due to animal (cat) (dog) hair and dander: Secondary | ICD-10-CM | POA: Diagnosis not present

## 2020-07-25 DIAGNOSIS — Z79899 Other long term (current) drug therapy: Secondary | ICD-10-CM | POA: Diagnosis not present

## 2020-07-25 DIAGNOSIS — J3089 Other allergic rhinitis: Secondary | ICD-10-CM | POA: Diagnosis not present

## 2020-07-25 DIAGNOSIS — S42001A Fracture of unspecified part of right clavicle, initial encounter for closed fracture: Secondary | ICD-10-CM | POA: Diagnosis not present

## 2020-07-25 DIAGNOSIS — Z7951 Long term (current) use of inhaled steroids: Secondary | ICD-10-CM | POA: Diagnosis not present

## 2020-07-25 LAB — CBC
HCT: 38.6 % (ref 36.0–46.0)
Hemoglobin: 12.3 g/dL (ref 12.0–15.0)
MCH: 29.3 pg (ref 26.0–34.0)
MCHC: 31.9 g/dL (ref 30.0–36.0)
MCV: 91.9 fL (ref 80.0–100.0)
Platelets: 433 10*3/uL — ABNORMAL HIGH (ref 150–400)
RBC: 4.2 MIL/uL (ref 3.87–5.11)
RDW: 14 % (ref 11.5–15.5)
WBC: 4.9 10*3/uL (ref 4.0–10.5)
nRBC: 0 % (ref 0.0–0.2)

## 2020-07-25 LAB — SARS CORONAVIRUS 2 (TAT 6-24 HRS): SARS Coronavirus 2: NEGATIVE

## 2020-07-25 NOTE — Progress Notes (Signed)
COVID Vaccine Completed:Yes Date COVID Vaccine completed:12/14/19 COVID vaccine manufacturer:   Moderna     PCP - Dr. Kirtland Bouchard. [facility]i Cardiologist - no  Chest x-ray - 07/11/20-Epic EKG - 07/11/20-Epic Stress Test - no ECHO - no Cardiac Cath - no Pacemaker/ICD device last checked:NA  Sleep Study - no CPAP -   Fasting Blood Sugar - NA Checks Blood Sugar _____ times a day  Blood Thinner Instructions:NA Aspirin Instructions: Last Dose:  Anesthesia review:   Patient denies shortness of breath, fever, cough and chest pain at PAT appointment yes   Patient verbalized understanding of instructions that were given to them at the PAT appointment. Patient was also instructed that they will need to review over the PAT instructions again at home before surgery. Yes  Pt has no SOB climbing stairs, doing housework or with ADLs. Her asthma hasn't been a problem for 2 years.

## 2020-07-25 NOTE — Telephone Encounter (Signed)
Noted  

## 2020-07-27 NOTE — Progress Notes (Signed)
Kaylee Pratt made aware to arrive at 7:45 AM 07/28/2020 and drink pre surgery drink at 6:45 AM, verbalized understanding.

## 2020-07-28 ENCOUNTER — Ambulatory Visit (HOSPITAL_COMMUNITY): Payer: BC Managed Care – PPO

## 2020-07-28 ENCOUNTER — Ambulatory Visit (HOSPITAL_COMMUNITY)
Admission: RE | Admit: 2020-07-28 | Discharge: 2020-07-28 | Disposition: A | Discharge status: Home / Self Care | Payer: BC Managed Care – PPO | Source: Other Acute Inpatient Hospital | Attending: Orthopedic Surgery | Admitting: Orthopedic Surgery

## 2020-07-28 ENCOUNTER — Ambulatory Visit (HOSPITAL_COMMUNITY): Payer: BC Managed Care – PPO | Admitting: Certified Registered Nurse Anesthetist

## 2020-07-28 ENCOUNTER — Encounter (HOSPITAL_COMMUNITY): Payer: Self-pay | Admitting: Orthopedic Surgery

## 2020-07-28 ENCOUNTER — Encounter (HOSPITAL_COMMUNITY)
Admission: RE | Disposition: A | Discharge status: Home / Self Care | Payer: Self-pay | Source: Other Acute Inpatient Hospital | Attending: Orthopedic Surgery

## 2020-07-28 ENCOUNTER — Ambulatory Visit (HOSPITAL_COMMUNITY): Payer: BC Managed Care – PPO | Admitting: Physician Assistant

## 2020-07-28 DIAGNOSIS — Z20822 Contact with and (suspected) exposure to covid-19: Secondary | ICD-10-CM | POA: Diagnosis not present

## 2020-07-28 DIAGNOSIS — Z7951 Long term (current) use of inhaled steroids: Secondary | ICD-10-CM | POA: Diagnosis not present

## 2020-07-28 DIAGNOSIS — J9811 Atelectasis: Secondary | ICD-10-CM | POA: Diagnosis not present

## 2020-07-28 DIAGNOSIS — Z79899 Other long term (current) drug therapy: Secondary | ICD-10-CM | POA: Diagnosis not present

## 2020-07-28 DIAGNOSIS — F418 Other specified anxiety disorders: Secondary | ICD-10-CM | POA: Diagnosis not present

## 2020-07-28 DIAGNOSIS — S42022A Displaced fracture of shaft of left clavicle, initial encounter for closed fracture: Secondary | ICD-10-CM | POA: Insufficient documentation

## 2020-07-28 DIAGNOSIS — Z419 Encounter for procedure for purposes other than remedying health state, unspecified: Secondary | ICD-10-CM

## 2020-07-28 DIAGNOSIS — S42001A Fracture of unspecified part of right clavicle, initial encounter for closed fracture: Secondary | ICD-10-CM | POA: Diagnosis not present

## 2020-07-28 DIAGNOSIS — E785 Hyperlipidemia, unspecified: Secondary | ICD-10-CM | POA: Diagnosis not present

## 2020-07-28 DIAGNOSIS — J45909 Unspecified asthma, uncomplicated: Secondary | ICD-10-CM | POA: Diagnosis not present

## 2020-07-28 DIAGNOSIS — W19XXXA Unspecified fall, initial encounter: Secondary | ICD-10-CM | POA: Diagnosis not present

## 2020-07-28 HISTORY — PX: ORIF CLAVICULAR FRACTURE: SHX5055

## 2020-07-28 SURGERY — OPEN REDUCTION INTERNAL FIXATION (ORIF) CLAVICULAR FRACTURE
Anesthesia: General | Laterality: Right

## 2020-07-28 MED ORDER — CHLORHEXIDINE GLUCONATE 0.12 % MT SOLN
15.0000 mL | Freq: Once | OROMUCOSAL | Status: AC
  Administered 2020-07-28: 15 mL via OROMUCOSAL

## 2020-07-28 MED ORDER — DEXAMETHASONE SODIUM PHOSPHATE 10 MG/ML IJ SOLN
INTRAMUSCULAR | Status: DC | PRN
  Administered 2020-07-28: 8 mg via INTRAVENOUS

## 2020-07-28 MED ORDER — ACETAMINOPHEN 10 MG/ML IV SOLN: 1000.0000 mg | Freq: Once | INTRAVENOUS | Status: DC | PRN

## 2020-07-28 MED ORDER — FENTANYL CITRATE (PF) 100 MCG/2ML IJ SOLN
INTRAMUSCULAR | Status: DC | PRN
  Administered 2020-07-28 (×4): 50 ug via INTRAVENOUS

## 2020-07-28 MED ORDER — FENTANYL CITRATE (PF) 100 MCG/2ML IJ SOLN
INTRAMUSCULAR | Status: AC
  Filled 2020-07-28: qty 2

## 2020-07-28 MED ORDER — HYDROMORPHONE HCL 1 MG/ML IJ SOLN
INTRAMUSCULAR | Status: AC
  Administered 2020-07-28: 0.25 mg via INTRAVENOUS
  Filled 2020-07-28: qty 1

## 2020-07-28 MED ORDER — MIDAZOLAM HCL 2 MG/2ML IJ SOLN
INTRAMUSCULAR | Status: AC
  Filled 2020-07-28: qty 2

## 2020-07-28 MED ORDER — ACETAMINOPHEN 160 MG/5ML PO SOLN: 325.0000 mg | Freq: Once | ORAL | Status: DC | PRN

## 2020-07-28 MED ORDER — DEXAMETHASONE SODIUM PHOSPHATE 10 MG/ML IJ SOLN
INTRAMUSCULAR | Status: AC
  Filled 2020-07-28: qty 1

## 2020-07-28 MED ORDER — BUPIVACAINE-EPINEPHRINE 0.25% -1:200000 IJ SOLN
INTRAMUSCULAR | Status: DC | PRN
  Administered 2020-07-28: 10 mL

## 2020-07-28 MED ORDER — TIZANIDINE HCL 4 MG PO TABS: 4.0000 mg | ORAL_TABLET | Freq: Three times a day (TID) | ORAL | 1 refills | Status: DC | PRN

## 2020-07-28 MED ORDER — ACETAMINOPHEN 325 MG PO TABS: 325.0000 mg | ORAL_TABLET | Freq: Once | ORAL | Status: DC | PRN

## 2020-07-28 MED ORDER — BUPIVACAINE-EPINEPHRINE (PF) 0.25% -1:200000 IJ SOLN
INTRAMUSCULAR | Status: AC
  Filled 2020-07-28: qty 30

## 2020-07-28 MED ORDER — VANCOMYCIN HCL IN DEXTROSE 1-5 GM/200ML-% IV SOLN
1000.0000 mg | INTRAVENOUS | Status: AC
  Administered 2020-07-28: 1000 mg via INTRAVENOUS
  Filled 2020-07-28: qty 200

## 2020-07-28 MED ORDER — ORAL CARE MOUTH RINSE: 15.0000 mL | Freq: Once | OROMUCOSAL | Status: AC

## 2020-07-28 MED ORDER — OXYCODONE HCL 5 MG PO TABS
5.0000 mg | ORAL_TABLET | Freq: Once | ORAL | Status: AC | PRN
  Administered 2020-07-28: 5 mg via ORAL

## 2020-07-28 MED ORDER — PROPOFOL 10 MG/ML IV BOLUS
INTRAVENOUS | Status: DC | PRN
  Administered 2020-07-28: 100 mg via INTRAVENOUS

## 2020-07-28 MED ORDER — MEPERIDINE HCL 50 MG/ML IJ SOLN: 6.2500 mg | INTRAMUSCULAR | Status: DC | PRN

## 2020-07-28 MED ORDER — ONDANSETRON HCL 4 MG/2ML IJ SOLN
INTRAMUSCULAR | Status: AC
  Filled 2020-07-28: qty 2

## 2020-07-28 MED ORDER — PHENYLEPHRINE HCL-NACL 10-0.9 MG/250ML-% IV SOLN
INTRAVENOUS | Status: DC | PRN
  Administered 2020-07-28: 40 ug/min via INTRAVENOUS

## 2020-07-28 MED ORDER — HYDROMORPHONE HCL 1 MG/ML IJ SOLN
0.2500 mg | INTRAMUSCULAR | Status: DC | PRN
  Administered 2020-07-28: 0.25 mg via INTRAVENOUS

## 2020-07-28 MED ORDER — AMISULPRIDE (ANTIEMETIC) 5 MG/2ML IV SOLN: 10.0000 mg | Freq: Once | INTRAVENOUS | Status: DC | PRN

## 2020-07-28 MED ORDER — OXYCODONE HCL 5 MG/5ML PO SOLN: 5.0000 mg | Freq: Once | ORAL | Status: AC | PRN

## 2020-07-28 MED ORDER — 0.9 % SODIUM CHLORIDE (POUR BTL) OPTIME
TOPICAL | Status: DC | PRN
  Administered 2020-07-28: 1000 mL

## 2020-07-28 MED ORDER — SUGAMMADEX SODIUM 200 MG/2ML IV SOLN
INTRAVENOUS | Status: DC | PRN
  Administered 2020-07-28: 200 mg via INTRAVENOUS

## 2020-07-28 MED ORDER — LIDOCAINE 2% (20 MG/ML) 5 ML SYRINGE
INTRAMUSCULAR | Status: AC
  Filled 2020-07-28: qty 10

## 2020-07-28 MED ORDER — LACTATED RINGERS IV SOLN: INTRAVENOUS | Status: DC

## 2020-07-28 MED ORDER — MIDAZOLAM HCL 5 MG/5ML IJ SOLN
INTRAMUSCULAR | Status: DC | PRN
  Administered 2020-07-28: 2 mg via INTRAVENOUS

## 2020-07-28 MED ORDER — OXYCODONE-ACETAMINOPHEN 5-325 MG PO TABS
ORAL_TABLET | ORAL | 0 refills | Status: DC
Start: 2020-07-28 — End: 2020-10-26

## 2020-07-28 MED ORDER — PHENYLEPHRINE HCL (PRESSORS) 10 MG/ML IV SOLN
INTRAVENOUS | Status: AC
  Filled 2020-07-28: qty 1

## 2020-07-28 MED ORDER — SCOPOLAMINE 1 MG/3DAYS TD PT72
MEDICATED_PATCH | TRANSDERMAL | Status: AC
  Filled 2020-07-28: qty 1

## 2020-07-28 MED ORDER — ROCURONIUM BROMIDE 10 MG/ML (PF) SYRINGE
PREFILLED_SYRINGE | INTRAVENOUS | Status: DC | PRN
  Administered 2020-07-28: 50 mg via INTRAVENOUS

## 2020-07-28 MED ORDER — LIDOCAINE 2% (20 MG/ML) 5 ML SYRINGE
INTRAMUSCULAR | Status: DC | PRN
  Administered 2020-07-28: 60 mg via INTRAVENOUS

## 2020-07-28 MED ORDER — OXYCODONE HCL 5 MG PO TABS
ORAL_TABLET | ORAL | Status: AC
  Filled 2020-07-28: qty 1

## 2020-07-28 MED ORDER — ONDANSETRON HCL 4 MG/2ML IJ SOLN
INTRAMUSCULAR | Status: DC | PRN
  Administered 2020-07-28: 4 mg via INTRAVENOUS

## 2020-07-28 MED ORDER — PROPOFOL 10 MG/ML IV BOLUS
INTRAVENOUS | Status: AC
  Filled 2020-07-28: qty 20

## 2020-07-28 SURGICAL SUPPLY — 48 items
BIT DRILL 2.0 LNG QUCK RELEASE (BIT) ×1 IMPLANT
BIT DRILL 2.3 QUICK RELEASE (BIT) ×1 IMPLANT
BIT DRILL 2.8 QUICK RELEASE (BIT) ×1 IMPLANT
CLSR STERI-STRIP ANTIMIC 1/2X4 (GAUZE/BANDAGES/DRESSINGS) ×2 IMPLANT
COVER SURGICAL LIGHT HANDLE (MISCELLANEOUS) ×2 IMPLANT
COVER WAND RF STERILE (DRAPES) IMPLANT
DRAPE ORTHO SPLIT 77X108 STRL (DRAPES) ×4
DRAPE SURG ORHT 6 SPLT 77X108 (DRAPES) ×2 IMPLANT
DRAPE U-SHAPE 47X51 STRL (DRAPES) ×2 IMPLANT
DRILL 2.0 LNG QUICK RELEASE (BIT) ×2
DRILL 2.3 QUICK RELEASE (BIT) ×2
DRILL 2.8 QUICK RELEASE (BIT) ×2
DRSG AQUACEL AG ADV 3.5X 4 (GAUZE/BANDAGES/DRESSINGS) ×2 IMPLANT
DRSG AQUACEL AG ADV 3.5X 6 (GAUZE/BANDAGES/DRESSINGS) ×2 IMPLANT
DURAPREP 26ML APPLICATOR (WOUND CARE) ×2 IMPLANT
ELECT REM PT RETURN 15FT ADLT (MISCELLANEOUS) ×2 IMPLANT
GLOVE BIO SURGEON STRL SZ7.5 (GLOVE) ×2 IMPLANT
GLOVE BIOGEL PI IND STRL 8 (GLOVE) ×1 IMPLANT
GLOVE BIOGEL PI INDICATOR 8 (GLOVE) ×1
GOWN STRL REUS W/ TWL XL LVL3 (GOWN DISPOSABLE) ×1 IMPLANT
GOWN STRL REUS W/TWL LRG LVL3 (GOWN DISPOSABLE) ×4 IMPLANT
GOWN STRL REUS W/TWL XL LVL3 (GOWN DISPOSABLE) ×2
KIT BASIN OR (CUSTOM PROCEDURE TRAY) ×2 IMPLANT
KIT TURNOVER KIT A (KITS) IMPLANT
MANIFOLD NEPTUNE II (INSTRUMENTS) ×2 IMPLANT
NEEDLE HYPO 25X1 1.5 SAFETY (NEEDLE) ×2 IMPLANT
NS IRRIG 1000ML POUR BTL (IV SOLUTION) ×2 IMPLANT
PENCIL SMOKE EVACUATOR (MISCELLANEOUS) IMPLANT
PLATE CLAVICLE 8 HOLE RT (Plate) ×2 IMPLANT
SCREW CORTICAL 2.3X14 (Screw) ×4 IMPLANT
SCREW CORTICAL 3.5X20MM (Screw) ×2 IMPLANT
SCREW HEXALOBE LOCKING 3.5X16M (Screw) ×2 IMPLANT
SCREW HEXALOBE NON-LOCK 3.5X14 (Screw) ×4 IMPLANT
SCREW HEXALOBE NON-LOCK 3.5X16 (Screw) ×2 IMPLANT
SCREW LOCK 12X3.5X HEXALOBE (Screw) ×1 IMPLANT
SCREW LOCKING 3.5X12 (Screw) ×2 IMPLANT
SLING ARM FOAM STRAP LRG (SOFTGOODS) ×2 IMPLANT
SLING ARM IMMOBILIZER LRG (SOFTGOODS) ×2 IMPLANT
STRIP CLOSURE SKIN 1/2X4 (GAUZE/BANDAGES/DRESSINGS) ×2 IMPLANT
SUCTION FRAZIER HANDLE 10FR (MISCELLANEOUS) ×2
SUCTION TUBE FRAZIER 10FR DISP (MISCELLANEOUS) ×1 IMPLANT
SUT MNCRL AB 4-0 PS2 18 (SUTURE) ×2 IMPLANT
SUT VIC AB 0 CT1 36 (SUTURE) ×2 IMPLANT
SUT VIC AB 2-0 CT1 27 (SUTURE) ×2
SUT VIC AB 2-0 CT1 TAPERPNT 27 (SUTURE) ×1 IMPLANT
SYR CONTROL 10ML LL (SYRINGE) ×2 IMPLANT
TOWEL OR 17X26 10 PK STRL BLUE (TOWEL DISPOSABLE) ×2 IMPLANT
WATER STERILE IRR 1000ML POUR (IV SOLUTION) ×2 IMPLANT

## 2020-07-28 NOTE — Transfer of Care (Signed)
Immediate Anesthesia Transfer of Care Note  Patient: Kaylee Pratt  Procedure(s) Performed: OPEN REDUCTION INTERNAL FIXATION (ORIF) CLAVICULAR FRACTURE (Right )  Patient Location: PACU  Anesthesia Type:General  Level of Consciousness: drowsy and patient cooperative  Airway & Oxygen Therapy: Patient Spontanous Breathing and Patient connected to face mask oxygen  Post-op Assessment: Report given to RN and Post -op Vital signs reviewed and stable  Post vital signs: Reviewed and stable  Last Vitals:  Vitals Value Taken Time  BP 142/78 07/28/20 1145  Temp    Pulse 74 07/28/20 1146  Resp 19 07/28/20 1146  SpO2 96 % 07/28/20 1146  Vitals shown include unvalidated device data.  Last Pain:  Vitals:   07/28/20 0810  TempSrc: Oral         Complications: No complications documented.

## 2020-07-28 NOTE — Op Note (Signed)
Procedure(s): OPEN REDUCTION INTERNAL FIXATION (ORIF) CLAVICULAR FRACTURE Procedure Note  Kaylee Pratt female 64 y.o. 07/28/2020  Preoperative diagnosis: Right displaced and comminuted clavicle fracture  Postoperative diagnosis: Same  Procedure(s) and Anesthesia Type:  OPEN REDUCTION INTERNAL FIXATION (ORIF) RIGHT CLAVICULAR FRACTURE - General  Surgeon(s) and Role:    Jones Broom, MD - Primary   Indications:  64 y.o. female s/p fall with right clavicle fracture. Indicated for surgery to promote anatomic restoration anatomy, improve functional outcome and avoid skin complications.     Surgeon: Berline Lopes   Assistants: Damita Lack PA-C [facility] was present and scrubbed throughout the procedure and was essential in positioning, retraction, exposure, and closure)  Anesthesia: General endotracheal anesthesia     Procedure Detail  OPEN REDUCTION INTERNAL FIXATION (ORIF) CLAVICULAR FRACTURE  Findings: There was significant comminution of a long oblique middle third clavicle fracture.  The fracture was significantly displaced.  The fracture was fixed with an Acumed 8 hole superior plate with combination of locking and nonlocking screws.  Bone quality was poor but with 2 interfragmentary screws fixation was felt to be adequate.  Estimated Blood Loss:  less than 50 mL         Drains: none  Blood Given: none         Specimens: none        Complications:  * No complications entered in OR log *         Disposition: PACU - hemodynamically stable.         Condition: stable    Procedure:     DESCRIPTION OF PROCEDURE: The patient was identified in preoperative  holding area where I personally marked the operative site after  verifying site, side, and procedure with the patient. The patient was taken back  to the operating room where general anesthesia was induced without  complication and was placed in the beach-chair position with the back  elevated  about 40 degrees and all extremities carefully padded and  positioned. The neck was turned very slightly away from the operative field  to assist in exposure. The right upper extremity was then prepped and  draped in a standard sterile fashion. The appropriate time-out  procedure was carried out. The patient did receive IV antibiotics  within 30 minutes of incision.  An incision was made in [employer] centered over the fracture site. Dissection was carried down through subcutaneous tissues and medial and lateral skin flaps were elevated.  The deltotrapezial fascia was then opened over the clavicle and the  medial and lateral fracture fragments were carefully exposed, taking great care to protect underlying neurovascular structures.  2 small butterfly fragments were kept in continuity with soft tissue as to not disrupt blood supply. Two 2.3 mm interfragmentary lag screwS were used anterior to posterior across the long oblique fracture. The 8 hole plate was positioned on the bone using fluoroscopic imaging to verify position. Locking and non locking screws were then used to fill the plate and flouroscopic imaging demonstrated appropriate position and screw lengths.  Bone quality was noted to be poor and screw fixation was adequate but not overly reassuring.  I do not feel that a longer plate was feasible given her small anatomy.  I felt that with the 2 interfragmentary screws this would hopefully be adequate fixation.  The wound was copiously irrigated with normal saline and the deltotrapezial fascia was  then carefully closed over the construct with #0 vicryl sutures in  interrupted fashion. The  skin was then closed with 2-0 Vicryl in a deep  dermal layer, 4-0 Monocryl for skin closure. Steri-Strips were applied.  10 mL of 0.25% Marcaine with epinephrine were infiltrated for  postoperative pain. Sterile dressings were applied including a medium  Mepilex dressing. The patient was then allowed to  awaken from general  anesthesia, placed in a sling, transferred to stretcher and taken to the  recovery room in stable condition.   POSTOPERATIVE PLAN: She will be observed in the recovery room.  If her pain control is adequate and she is hemodynamically stable she can go home today with family.  If we need to keep her overnight for pain control we can do that.

## 2020-07-28 NOTE — Anesthesia Procedure Notes (Signed)
Procedure Name: Intubation Date/Time: 07/28/2020 10:04 AM Performed by: Montel Clock, CRNA Pre-anesthesia Checklist: Patient identified, Emergency Drugs available, Suction available, Patient being monitored and Timeout performed Patient Re-evaluated:Patient Re-evaluated prior to induction Oxygen Delivery Method: Circle system utilized Preoxygenation: Pre-oxygenation with 100% oxygen Induction Type: IV induction Ventilation: Mask ventilation without difficulty Laryngoscope Size: Mac and 3 Grade View: Grade I Tube type: Oral Tube size: 7.0 mm Number of attempts: 1 Airway Equipment and Method: Stylet Placement Confirmation: ETT inserted through vocal cords under direct vision,  positive ETCO2 and breath sounds checked- equal and bilateral Secured at: 21 cm Tube secured with: Tape Dental Injury: Teeth and Oropharynx as per pre-operative assessment

## 2020-07-28 NOTE — Anesthesia Preprocedure Evaluation (Addendum)
Anesthesia Evaluation  Patient identified by MRN, date of birth, ID band Patient awake    Reviewed: Allergy & Precautions, NPO status , Patient's Chart, lab work & pertinent test results  Airway Mallampati: II  TM Distance: >3 FB Neck ROM: Full    Dental  (+) Teeth Intact, Dental Advisory Given, Chipped,    Pulmonary asthma ,    Pulmonary exam normal        Cardiovascular negative cardio ROS   Rhythm:Regular Rate:Normal     Neuro/Psych PSYCHIATRIC DISORDERS Anxiety Depression negative neurological ROS     GI/Hepatic Neg liver ROS, hiatal hernia, GERD  Medicated,  Endo/Other  negative endocrine ROS  Renal/GU negative Renal ROS     Musculoskeletal negative musculoskeletal ROS (+)   Abdominal Normal abdominal exam  (+)   Peds  Hematology negative hematology ROS (+)   Anesthesia Other Findings   Reproductive/Obstetrics                            Anesthesia Physical Anesthesia Plan  ASA: II  Anesthesia Plan: General   Post-op Pain Management:    Induction: Intravenous  PONV Risk Score and Plan: 4 or greater and Ondansetron, Dexamethasone, Midazolam and Scopolamine patch - Pre-op  Airway Management Planned: Oral ETT  Additional Equipment: None  Intra-op Plan:   Post-operative Plan: Extubation in OR  Informed Consent: I have reviewed the patients History and Physical, chart, labs and discussed the procedure including the risks, benefits and alternatives for the proposed anesthesia with the patient or authorized representative who has indicated his/her understanding and acceptance.     Dental advisory given  Plan Discussed with: CRNA  Anesthesia Plan Comments:        Anesthesia Quick Evaluation

## 2020-07-28 NOTE — Anesthesia Postprocedure Evaluation (Signed)
Anesthesia Post Note  Patient: Kaylee Pratt  Procedure(s) Performed: OPEN REDUCTION INTERNAL FIXATION (ORIF) CLAVICULAR FRACTURE (Right )     Patient location during evaluation: PACU Anesthesia Type: General Level of consciousness: awake and alert Pain management: pain level controlled Vital Signs Assessment: post-procedure vital signs reviewed and stable Respiratory status: spontaneous breathing, nonlabored ventilation, respiratory function stable and patient connected to nasal cannula oxygen Cardiovascular status: blood pressure returned to baseline and stable Postop Assessment: no apparent nausea or vomiting Anesthetic complications: no   No complications documented.  Last Vitals:  Vitals:   07/28/20 1140 07/28/20 1145  BP: (!) 142/78   Pulse: 79   Resp: (!) 22   Temp: 36.7 C 36.7 C  SpO2: 100% 100%    Last Pain:  Vitals:   07/28/20 1140  TempSrc:   PainSc: 5                  Davie Sagona S

## 2020-07-28 NOTE — Discharge Instructions (Signed)
Discharge Instructions after Open Shoulder Repair  A sling has been provided for you. Remain in your sling at all times. This includes sleeping in your sling.  Use ice on the shoulder intermittently over the first 48 hours after surgery.  Pain medicine has been prescribed for you.  Use your medicine liberally over the first 48 hours, and then you can begin to taper your use. You may take Extra Strength Tylenol or Tylenol only in place of the pain pills. DO NOT take ANY nonsteroidal anti-inflammatory pain medications: Advil, Motrin, Ibuprofen, Aleve, Naproxen or Naprosyn.  The dressing is waterproof. Please leave it on until your first visit back to the office. Do not get the dressing wet as you have gauze over the waterproof dressing. Call the office if there are issues with the dressing. Take one aspirin, a day for 2 weeks after surgery, unless you have an aspirin sensitivity/ allergy or asthma.   Please call [phone] during normal business hours or [phone] after hours for any problems. Including the following:     General Anesthesia, Adult, Care After This sheet gives you information about how to care for yourself after your procedure. Your health care provider may also give you more specific instructions. If you have problems or questions, contact your health care provider. What can I expect after the procedure? After the procedure, the following side effects are common:  Pain or discomfort at the IV site.  Nausea.  Vomiting.  Sore throat.  Trouble concentrating.  Feeling cold or chills.  Weak or tired.  Sleepiness and fatigue.  Soreness and body aches. These side effects can affect parts of the body that were not involved in surgery. Follow these instructions at home:  For at least 24 hours after the procedure:  Have a responsible adult stay with you. It is important to have someone help care for you until you are awake and alert.  Rest as needed.  Do  not: ? Participate in activities in which you could fall or become injured. ? Drive. ? Use heavy machinery. ? Drink alcohol. ? Take sleeping pills or medicines that cause drowsiness. ? Make important decisions or sign legal documents. ? Take care of children on your own. Eating and drinking  Follow any instructions from your health care provider about eating or drinking restrictions.  When you feel hungry, start by eating small amounts of foods that are soft and easy to digest (bland), such as toast. Gradually return to your regular diet.  Drink enough fluid to keep your urine pale yellow.  If you vomit, rehydrate by drinking water, juice, or clear broth. General instructions  If you have sleep apnea, surgery and certain medicines can increase your risk for breathing problems. Follow instructions from your health care provider about wearing your sleep device: ? Anytime you are sleeping, including during daytime naps. ? While taking prescription pain medicines, sleeping medicines, or medicines that make you drowsy.  Return to your normal activities as told by your health care provider. Ask your health care provider what activities are safe for you.  Take over-the-counter and prescription medicines only as told by your health care provider.  If you smoke, do not smoke without supervision.  Keep all follow-up visits as told by your health care provider. This is important. Contact a health care provider if:  You have nausea or vomiting that does not get better with medicine.  You cannot eat or drink without vomiting.  You have pain that does not get  better with medicine.  You are unable to pass urine.  You develop a skin rash.  You have a fever.  You have redness around your IV site that gets worse. Get help right away if:  You have difficulty breathing.  You have chest pain.  You have blood in your urine or stool, or you vomit blood. Summary  After the procedure, it  is common to have a sore throat or nausea. It is also common to feel tired.  Have a responsible adult stay with you for the first 24 hours after general anesthesia. It is important to have someone help care for you until you are awake and alert.  When you feel hungry, start by eating small amounts of foods that are soft and easy to digest (bland), such as toast. Gradually return to your regular diet.  Drink enough fluid to keep your urine pale yellow.  Return to your normal activities as told by your health care provider. Ask your health care provider what activities are safe for you. This information is not intended to replace advice given to you by your health care provider. Make sure you discuss any questions you have with your health care provider. Document Revised: 08/30/2017 Document Reviewed: 04/12/2017 Elsevier Patient Education  2020 [employer]  General Anesthesia, Adult, Care After This sheet gives you information about how to care for yourself after your procedure. Your health care provider may also give you more specific instructions. If you have problems or questions, contact your health care provider. What can I expect after the procedure? After the procedure, the following side effects are common:  Pain or discomfort at the IV site.  Nausea.  Vomiting.  Sore throat.  Trouble concentrating.  Feeling cold or chills.  Weak or tired.  Sleepiness and fatigue.  Soreness and body aches. These side effects can affect parts of the body that were not involved in surgery. Follow these instructions at home:  For at least 24 hours after the procedure:  Have a responsible adult stay with you. It is important to have someone help care for you until you are awake and alert.  Rest as needed.  Do not: ? Participate in activities in which you could fall or become injured. ? Drive. ? Use heavy machinery. ? Drink alcohol. ? Take sleeping pills or medicines that cause  drowsiness. ? Make important decisions or sign legal documents. ? Take care of children on your own. Eating and drinking  Follow any instructions from your health care provider about eating or drinking restrictions.  When you feel hungry, start by eating small amounts of foods that are soft and easy to digest (bland), such as toast. Gradually return to your regular diet.  Drink enough fluid to keep your urine pale yellow.  If you vomit, rehydrate by drinking water, juice, or clear broth. General instructions  If you have sleep apnea, surgery and certain medicines can increase your risk for breathing problems. Follow instructions from your health care provider about wearing your sleep device: ? Anytime you are sleeping, including during daytime naps. ? While taking prescription pain medicines, sleeping medicines, or medicines that make you drowsy.  Return to your normal activities as told by your health care provider. Ask your health care provider what activities are safe for you.  Take over-the-counter and prescription medicines only as told by your health care provider.  If you smoke, do not smoke without supervision.  Keep all follow-up visits as told by your health  care provider. This is important. Contact a health care provider if:  You have nausea or vomiting that does not get better with medicine.  You cannot eat or drink without vomiting.  You have pain that does not get better with medicine.  You are unable to pass urine.  You develop a skin rash.  You have a fever.  You have redness around your IV site that gets worse. Get help right away if:  You have difficulty breathing.  You have chest pain.  You have blood in your urine or stool, or you vomit blood. Summary  After the procedure, it is common to have a sore throat or nausea. It is also common to feel tired.  Have a responsible adult stay with you for the first 24 hours after general anesthesia. It is  important to have someone help care for you until you are awake and alert.  When you feel hungry, start by eating small amounts of foods that are soft and easy to digest (bland), such as toast. Gradually return to your regular diet.  Drink enough fluid to keep your urine pale yellow.  Return to your normal activities as told by your health care provider. Ask your health care provider what activities are safe for you. This information is not intended to replace advice given to you by your health care provider. Make sure you discuss any questions you have with your health care provider. Document Revised: 08/30/2017 Document Reviewed: 04/12/2017 Elsevier Patient Education  2020 Else[employer] Inc.  - excessive redness of the incisions - drainage for more than 4 days - fever of more than 101.5 F  *Please note that pain medications will not be refilled after hours or on weekends.

## 2020-07-28 NOTE — H&P (Signed)
Kaylee Pratt is an 64 y.o. female.   Chief Complaint: Right clavicle fracture HPI: Right displaced and comminuted clavicle fracture after a fall.  Past Medical History:  Diagnosis Date  . Allergy   . Asthma   . Depression   . Duodenal diverticulum 07/13/2020   GI bleed with multi blood transfusions  . GERD (gastroesophageal reflux disease)   . GERD (gastroesophageal reflux disease)   . Hiatal hernia   . Hyperlipidemia   . Osteoporosis     Past Surgical History:  Procedure Laterality Date  . COLONOSCOPY WITH PROPOFOL N/A 07/13/2020   Procedure: COLONOSCOPY WITH PROPOFOL;  Surgeon: Beverley Fiedler, MD;  Location: [facility];  Service: Gastroenterology;  Laterality: N/A;  . ESOPHAGOGASTRODUODENOSCOPY (EGD) WITH PROPOFOL N/A 07/13/2020   Procedure: ESOPHAGOGASTRODUODENOSCOPY (EGD) WITH PROPOFOL;  Surgeon: Beverley Fiedler, MD;  Location: [facility];  Service: Gastroenterology;  Laterality: N/A;  . TUBAL LIGATION      Family History  Problem Relation Age of Onset  . Prostate cancer Father   . Other Neg Hx        no early CVA/CAD, no cancer  . Sudden death Neg Hx   . Diabetes Neg Hx   . Heart attack Neg Hx   . Hyperlipidemia Neg Hx   . Hypertension Neg Hx    Social History:  reports that she has never smoked. She has never used smokeless tobacco. She reports current alcohol use of about 2.0 standard drinks of alcohol per week. She reports that she does not use drugs.  Allergies:  Allergies  Allergen Reactions  . Amoxil [Amoxicillin] Rash  . Iohexol Hives    Pt developed hives on her face and chest after IV contrast     Medications Prior to Admission  Medication Sig Dispense Refill  . acetaminophen (TYLENOL) 325 MG tablet Take 2 tablets (650 mg total) by mouth every 6 (six) hours as needed for mild pain (or Fever >/= 101).    . ADVAIR DISKUS 250-50 MCG/DOSE AEPB Inhale 1 puff into the lungs 2 (two) times daily.    [name] Kitchen albuterol (VENTOLIN HFA) 108 (90 Base) MCG/ACT inhaler Inhale  2 puffs into the lungs every 6 (six) hours as needed for wheezing or shortness of breath. 18 g 3  . cholecalciferol (VITAMIN D) 1000 UNITS tablet Take 2,000 Units by mouth daily.    . clonazePAM (KLONOPIN) 1 MG tablet 1 tab po BID prn anxiety (Patient taking differently: Take 1 mg by mouth at bedtime. ) 90 tablet 1  . escitalopram (LEXAPRO) 20 MG tablet TAKE 1 TABLET BY MOUTH EVERY DAY (Patient taking differently: Take 10 mg by mouth at bedtime. ) 90 tablet 1  . omeprazole (PRILOSEC) 40 MG capsule Take 1 capsule (40 mg total) by mouth daily. (Patient taking differently: Take 40 mg by mouth daily as needed (indigestion). ) 90 capsule 1  . simvastatin (ZOCOR) 20 MG tablet TAKE 1 TABLET BY MOUTH EVERYDAY AT BEDTIME (Patient taking differently: Take 10 mg by mouth 2 (two) times a week. ) 90 tablet 1  . traMADol (ULTRAM) 50 MG tablet Take 1 tablet (50 mg total) by mouth every 6 (six) hours as needed for severe pain. 20 tablet 0    No results found for this or any previous visit (from the past 48 hour(s)). No results found.  Review of Systems  All other systems reviewed and are negative.   Blood pressure 135/75, pulse 81, temperature 98.4 F (36.9 C), temperature source Oral, resp. rate  18, height 5' (1.524 m), weight 60.3 kg, SpO2 99 %. Physical Exam Constitutional:      Appearance: Normal appearance.  HENT:     Head: Atraumatic.  Eyes:     Extraocular Movements: Extraocular movements intact.  Cardiovascular:     Pulses: Normal pulses.  Pulmonary:     Effort: Pulmonary effort is normal.  Musculoskeletal:     Comments: Right shoulder with prominence of the middle third clavicle and tenderness to palpation.  Skin is intact.  Skin:    General: Skin is warm.  Neurological:     Mental Status: She is alert.  Psychiatric:        Mood and Affect: Mood normal.      Assessment/Plan Right displaced and comminuted clavicle fracture after a fall. Plan ORIF right clavicle fracture Risks /  benefits of surgery discussed Consent on chart  NPO for OR Preop antibiotics   Berline Lopes, MD 07/28/2020, 9:09 AM

## 2020-07-29 ENCOUNTER — Encounter (HOSPITAL_COMMUNITY): Payer: Self-pay | Admitting: Orthopedic Surgery

## 2020-08-03 DIAGNOSIS — Z9889 Other specified postprocedural states: Secondary | ICD-10-CM | POA: Diagnosis not present

## 2020-08-22 ENCOUNTER — Encounter: Payer: Self-pay | Admitting: Family Medicine

## 2020-08-31 ENCOUNTER — Ambulatory Visit (HOSPITAL_BASED_OUTPATIENT_CLINIC_OR_DEPARTMENT_OTHER): Payer: BC Managed Care – PPO

## 2020-09-01 IMAGING — MG DIGITAL SCREENING BILATERAL MAMMOGRAM WITH TOMO AND CAD
8 series · 8 of 24 positions shown · non-contrast
Comparison: Previous exam(s).

CLINICAL DATA: Screening.

EXAM:
DIGITAL SCREENING BILATERAL MAMMOGRAM WITH TOMO AND CAD

[L CC synth-2D]
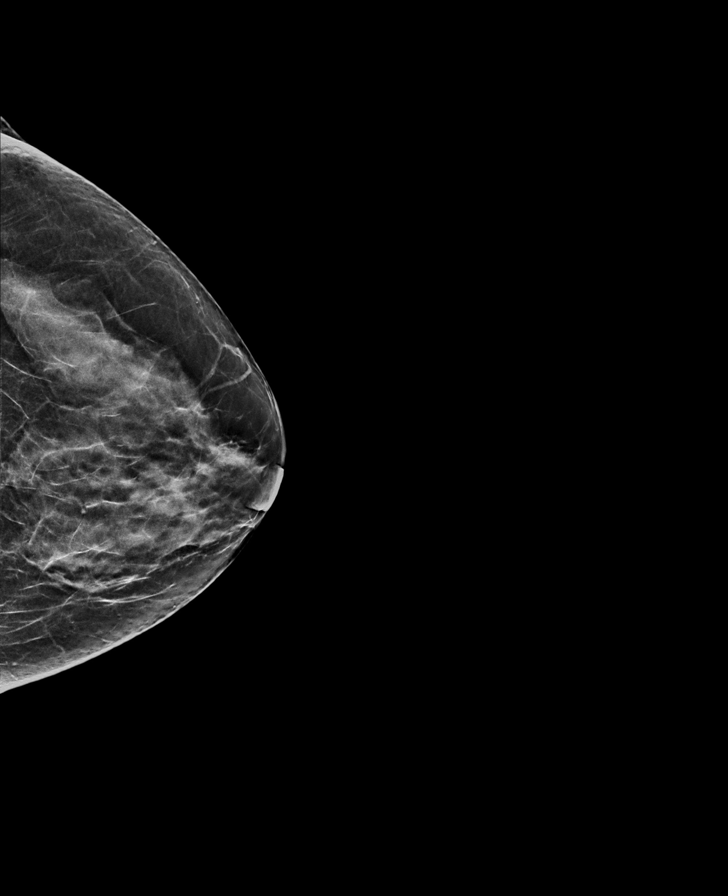

[L MLO synth-2D]
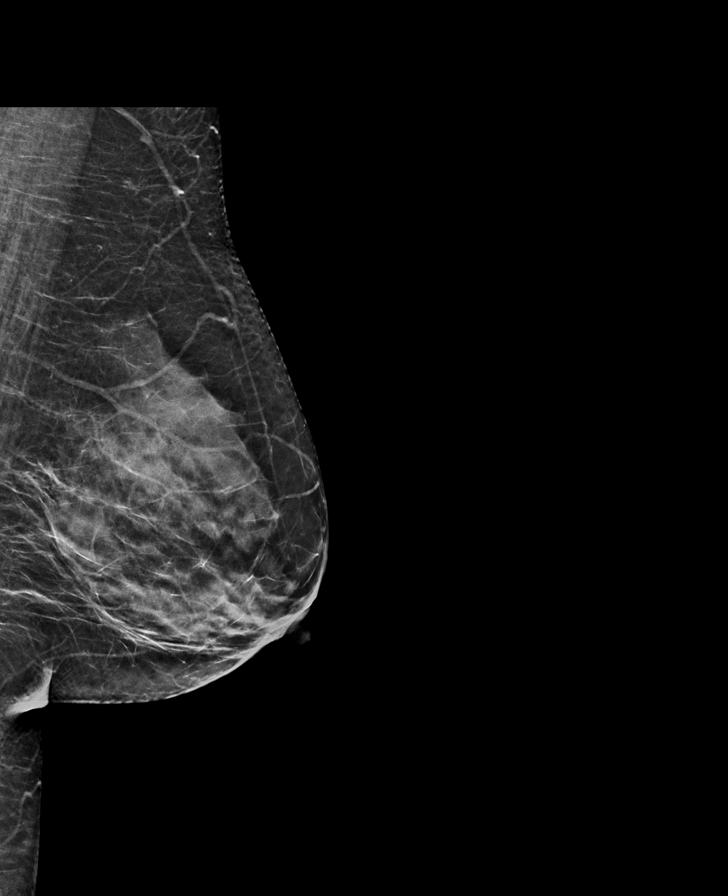

[R MLO synth-2D]
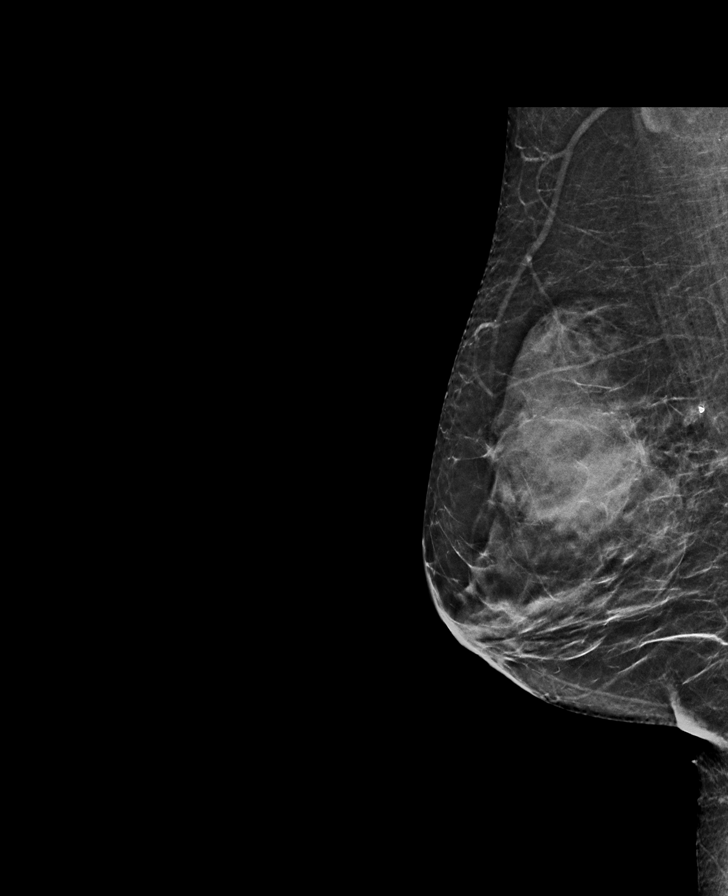

[R CC synth-2D]
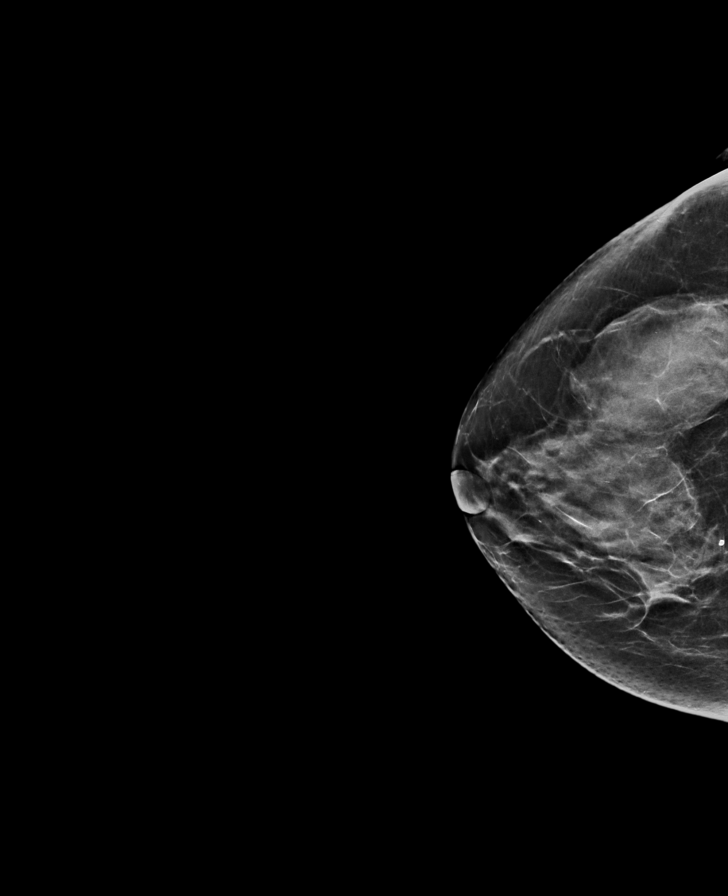

[R MLO tomo · tomo slice 29/56.0]
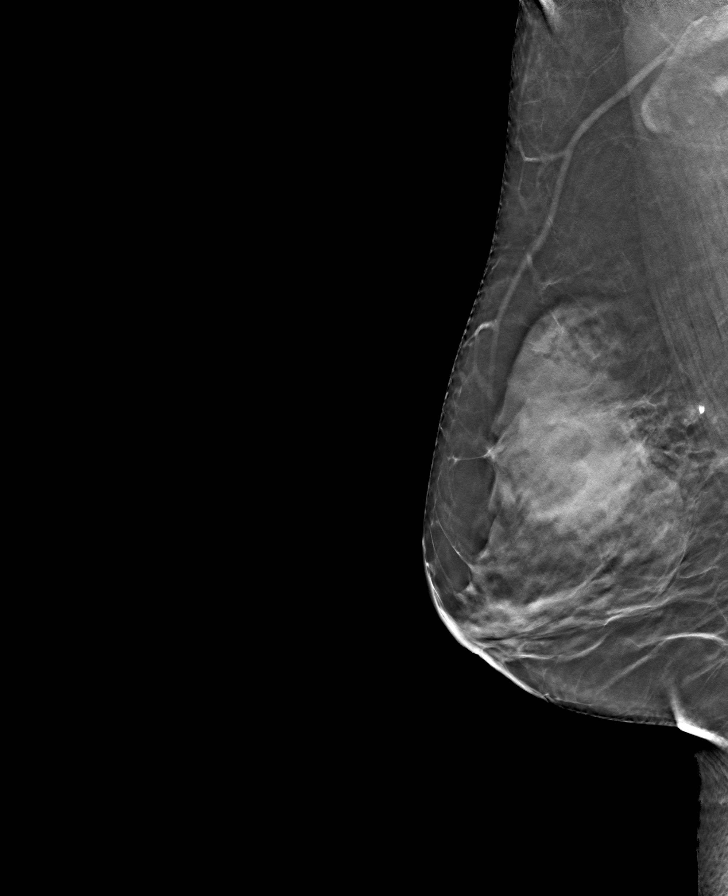

[L MLO tomo · tomo slice 28/55.0]
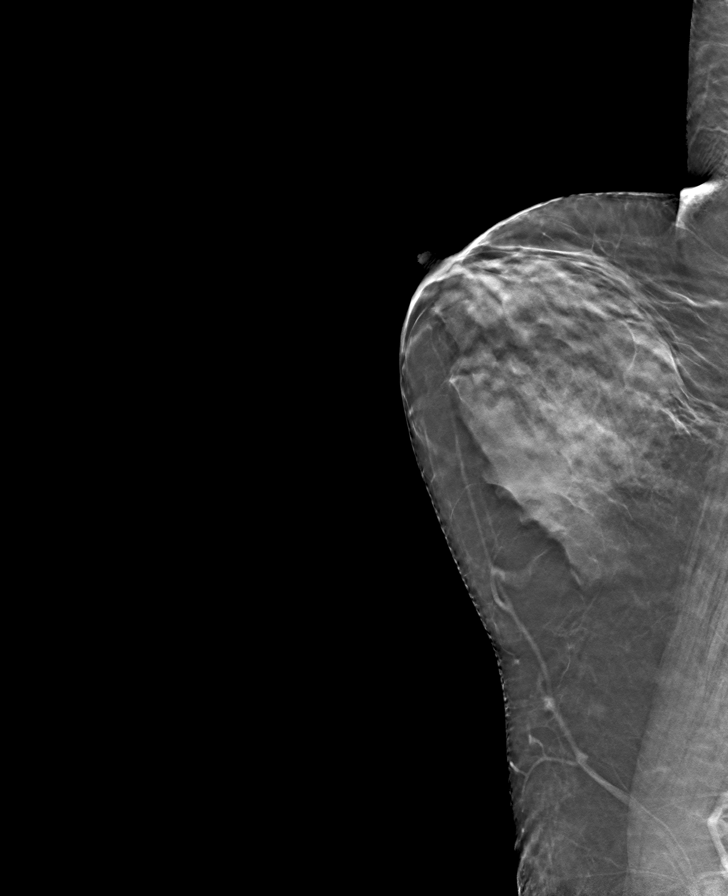

[R CC tomo · tomo slice 31/61.0]
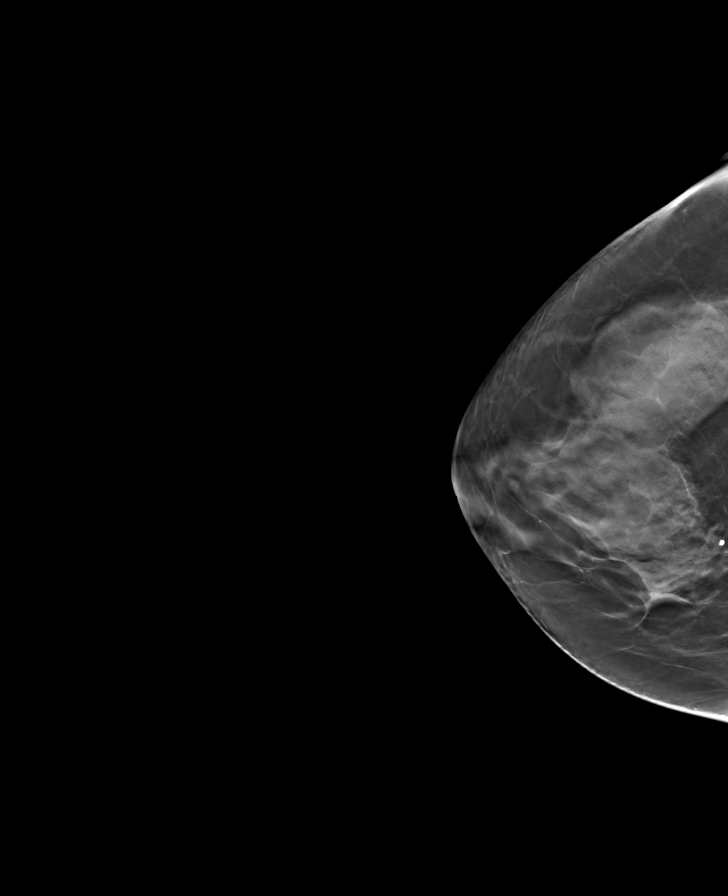

[L CC tomo · tomo slice 28/55.0]
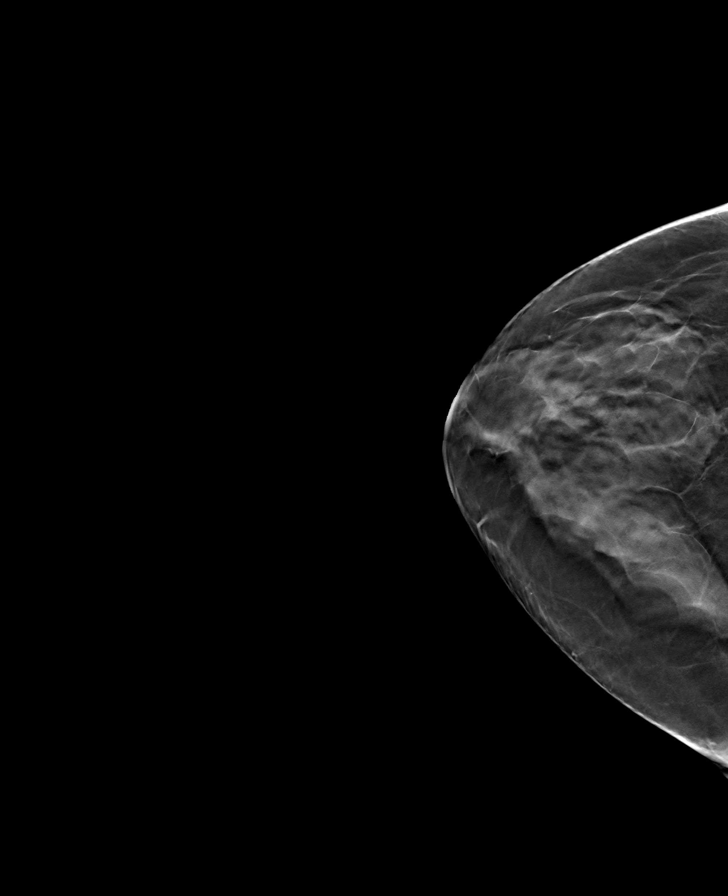

[8 of 24 positions shown; findings below may reference images not displayed]

ACR Breast Density Category c: The breast tissue is heterogeneously
dense, which may obscure small masses.
FINDINGS: There are no findings suspicious for malignancy. Images were
processed with CAD.
IMPRESSION: No mammographic evidence of malignancy. A result letter of this
screening mammogram will be mailed directly to the patient.

RECOMMENDATION:
Screening mammogram in one year. (Code:FT-U-LHB)

BI-RADS CATEGORY  1: Negative.

## 2020-09-05 ENCOUNTER — Encounter: Payer: Self-pay | Admitting: Family Medicine

## 2020-09-14 DIAGNOSIS — L814 Other melanin hyperpigmentation: Secondary | ICD-10-CM | POA: Diagnosis not present

## 2020-09-14 DIAGNOSIS — L810 Postinflammatory hyperpigmentation: Secondary | ICD-10-CM | POA: Diagnosis not present

## 2020-09-14 DIAGNOSIS — L905 Scar conditions and fibrosis of skin: Secondary | ICD-10-CM | POA: Diagnosis not present

## 2020-09-14 DIAGNOSIS — Z419 Encounter for procedure for purposes other than remedying health state, unspecified: Secondary | ICD-10-CM | POA: Diagnosis not present

## 2020-09-14 DIAGNOSIS — Z9889 Other specified postprocedural states: Secondary | ICD-10-CM | POA: Diagnosis not present

## 2020-09-16 ENCOUNTER — Encounter: Payer: Self-pay | Admitting: Family Medicine

## 2020-09-16 ENCOUNTER — Other Ambulatory Visit: Payer: Self-pay

## 2020-09-16 MED ORDER — ESCITALOPRAM OXALATE 20 MG PO TABS
20.0000 mg | ORAL_TABLET | Freq: Every day | ORAL | 0 refills | Status: DC
Start: 2020-09-16 — End: 2021-01-19

## 2020-09-20 DIAGNOSIS — M25611 Stiffness of right shoulder, not elsewhere classified: Secondary | ICD-10-CM | POA: Diagnosis not present

## 2020-09-20 DIAGNOSIS — R531 Weakness: Secondary | ICD-10-CM | POA: Diagnosis not present

## 2020-09-20 DIAGNOSIS — S42001D Fracture of unspecified part of right clavicle, subsequent encounter for fracture with routine healing: Secondary | ICD-10-CM | POA: Diagnosis not present

## 2020-09-21 DIAGNOSIS — S42001D Fracture of unspecified part of right clavicle, subsequent encounter for fracture with routine healing: Secondary | ICD-10-CM | POA: Diagnosis not present

## 2020-09-21 DIAGNOSIS — M25611 Stiffness of right shoulder, not elsewhere classified: Secondary | ICD-10-CM | POA: Diagnosis not present

## 2020-09-21 DIAGNOSIS — R531 Weakness: Secondary | ICD-10-CM | POA: Diagnosis not present

## 2020-09-29 DIAGNOSIS — S42001D Fracture of unspecified part of right clavicle, subsequent encounter for fracture with routine healing: Secondary | ICD-10-CM | POA: Diagnosis not present

## 2020-09-29 DIAGNOSIS — M25611 Stiffness of right shoulder, not elsewhere classified: Secondary | ICD-10-CM | POA: Diagnosis not present

## 2020-09-29 DIAGNOSIS — R531 Weakness: Secondary | ICD-10-CM | POA: Diagnosis not present

## 2020-10-04 DIAGNOSIS — M25611 Stiffness of right shoulder, not elsewhere classified: Secondary | ICD-10-CM | POA: Diagnosis not present

## 2020-10-04 DIAGNOSIS — R531 Weakness: Secondary | ICD-10-CM | POA: Diagnosis not present

## 2020-10-04 DIAGNOSIS — S42001D Fracture of unspecified part of right clavicle, subsequent encounter for fracture with routine healing: Secondary | ICD-10-CM | POA: Diagnosis not present

## 2020-10-06 ENCOUNTER — Other Ambulatory Visit: Payer: Self-pay | Admitting: Family Medicine

## 2020-10-06 DIAGNOSIS — R531 Weakness: Secondary | ICD-10-CM | POA: Diagnosis not present

## 2020-10-06 DIAGNOSIS — M25611 Stiffness of right shoulder, not elsewhere classified: Secondary | ICD-10-CM | POA: Diagnosis not present

## 2020-10-06 DIAGNOSIS — S42001D Fracture of unspecified part of right clavicle, subsequent encounter for fracture with routine healing: Secondary | ICD-10-CM | POA: Diagnosis not present

## 2020-10-10 DIAGNOSIS — S42001D Fracture of unspecified part of right clavicle, subsequent encounter for fracture with routine healing: Secondary | ICD-10-CM | POA: Diagnosis not present

## 2020-10-10 DIAGNOSIS — R531 Weakness: Secondary | ICD-10-CM | POA: Diagnosis not present

## 2020-10-10 DIAGNOSIS — M25611 Stiffness of right shoulder, not elsewhere classified: Secondary | ICD-10-CM | POA: Diagnosis not present

## 2020-10-12 ENCOUNTER — Telehealth: Payer: Self-pay

## 2020-10-12 NOTE — Telephone Encounter (Signed)
Requesting:Klonopin Contract: UDS: Last Visit11/8/21: Next Visit:05/17/21 Last Refill:05/13/20 90 tabs 1 refill Please Advise

## 2020-10-13 DIAGNOSIS — M25611 Stiffness of right shoulder, not elsewhere classified: Secondary | ICD-10-CM | POA: Diagnosis not present

## 2020-10-13 DIAGNOSIS — S42001D Fracture of unspecified part of right clavicle, subsequent encounter for fracture with routine healing: Secondary | ICD-10-CM | POA: Diagnosis not present

## 2020-10-13 DIAGNOSIS — R531 Weakness: Secondary | ICD-10-CM | POA: Diagnosis not present

## 2020-10-13 MED ORDER — CLONAZEPAM 1 MG PO TABS
ORAL_TABLET | ORAL | 1 refills | Status: DC
Start: 2020-10-13 — End: 2021-05-29

## 2020-10-13 NOTE — Addendum Note (Signed)
Addended by: Sheliah Hatch on: 10/13/2020 10:06 AM   Modules accepted: Orders

## 2020-10-13 NOTE — Telephone Encounter (Signed)
Prescription filled at pt's request ?

## 2020-10-26 ENCOUNTER — Ambulatory Visit: Payer: BC Managed Care – PPO | Admitting: Plastic Surgery

## 2020-10-26 ENCOUNTER — Other Ambulatory Visit: Payer: Self-pay

## 2020-10-26 ENCOUNTER — Encounter: Payer: Self-pay | Admitting: Plastic Surgery

## 2020-10-26 VITALS — BP 139/83 | HR 73 | Ht 60.0 in | Wt 131.2 lb

## 2020-10-26 DIAGNOSIS — Z9889 Other specified postprocedural states: Secondary | ICD-10-CM | POA: Diagnosis not present

## 2020-10-26 DIAGNOSIS — R531 Weakness: Secondary | ICD-10-CM | POA: Diagnosis not present

## 2020-10-26 DIAGNOSIS — M25611 Stiffness of right shoulder, not elsewhere classified: Secondary | ICD-10-CM | POA: Diagnosis not present

## 2020-10-26 DIAGNOSIS — L989 Disorder of the skin and subcutaneous tissue, unspecified: Secondary | ICD-10-CM

## 2020-10-26 DIAGNOSIS — S42001D Fracture of unspecified part of right clavicle, subsequent encounter for fracture with routine healing: Secondary | ICD-10-CM | POA: Diagnosis not present

## 2020-10-26 NOTE — Progress Notes (Signed)
Referring Provider Sheliah Hatch, MD [address] [place],  [place] [address]   CC:  Chief Complaint  Patient presents with  . Advice Only      Kaylee Pratt is an 65 y.o. female.  HPI: Patient resents with a concern about her left chest.  She is noticed a small nodule there and it has her concerned.  She has thus far been up-to-date on her mammograms and has had no previous breast procedures or biopsies.  She felt like this has come on in the past year or so and is not going away.  She would like to have it removed.  Allergies  Allergen Reactions  . Amoxil [Amoxicillin] Rash  . Iohexol Hives    Pt developed hives on her face and chest after IV contrast     Outpatient Encounter Medications as of 10/26/2020  Medication Sig  . acetaminophen (TYLENOL) 325 MG tablet Take 2 tablets (650 mg total) by mouth every 6 (six) hours as needed for mild pain (or Fever >/= 101).  . ADVAIR DISKUS 250-50 MCG/DOSE AEPB Inhale 1 puff into the lungs 2 (two) times daily.  [name] Kitchen albuterol (VENTOLIN HFA) 108 (90 Base) MCG/ACT inhaler INHALE 2 PUFFS INTO THE LUNGS EVERY 6 HOURS AS NEEDED FOR WHEEZING OR SHORTNESS OF BREATH  . cholecalciferol (VITAMIN D) 1000 UNITS tablet Take 2,000 Units by mouth daily.  . clonazePAM (KLONOPIN) 1 MG tablet 1 tab po BID prn anxiety  . escitalopram (LEXAPRO) 20 MG tablet Take 1 tablet (20 mg total) by mouth daily.  [name] Kitchen omeprazole (PRILOSEC) 40 MG capsule Take 1 capsule (40 mg total) by mouth daily. (Patient taking differently: Take 40 mg by mouth daily as needed (indigestion).)  . simvastatin (ZOCOR) 20 MG tablet TAKE 1 TABLET BY MOUTH EVERYDAY AT BEDTIME (Patient taking differently: Take 10 mg by mouth 2 (two) times a week.)  . [DISCONTINUED] oxyCODONE-acetaminophen (PERCOCET) 5-325 MG tablet Take 1-2 tablets every 4 hours as needed for post operative pain. MAX 6/day  . [DISCONTINUED] tiZANidine (ZANAFLEX) 4 MG tablet Take 1 tablet (4 mg total) by mouth every 8 (eight)  hours as needed for muscle spasms.  . [DISCONTINUED] traMADol (ULTRAM) 50 MG tablet Take 1 tablet (50 mg total) by mouth every 6 (six) hours as needed for severe pain.   No facility-administered encounter medications on file as of 10/26/2020.     Past Medical History:  Diagnosis Date  . Allergy   . Asthma   . Depression   . Duodenal diverticulum 07/13/2020   GI bleed with multi blood transfusions  . GERD (gastroesophageal reflux disease)   . GERD (gastroesophageal reflux disease)   . Hiatal hernia   . Hyperlipidemia   . Osteoporosis     Past Surgical History:  Procedure Laterality Date  . COLONOSCOPY WITH PROPOFOL N/A 07/13/2020   Procedure: COLONOSCOPY WITH PROPOFOL;  Surgeon: Beverley Fiedler, MD;  Location: [facility];  Service: Gastroenterology;  Laterality: N/A;  . ESOPHAGOGASTRODUODENOSCOPY (EGD) WITH PROPOFOL N/A 07/13/2020   Procedure: ESOPHAGOGASTRODUODENOSCOPY (EGD) WITH PROPOFOL;  Surgeon: Beverley Fiedler, MD;  Location: [facility];  Service: Gastroenterology;  Laterality: N/A;  . ORIF CLAVICULAR FRACTURE Right 07/28/2020   Procedure: OPEN REDUCTION INTERNAL FIXATION (ORIF) CLAVICULAR FRACTURE;  Surgeon: Jones Broom, MD;  Location: [facility];  Service: Orthopedics;  Laterality: Right;  . TUBAL LIGATION      Family History  Problem Relation Age of Onset  . Prostate cancer Father   . Other  Neg Hx        no early CVA/CAD, no cancer  . Sudden death Neg Hx   . Diabetes Neg Hx   . Heart attack Neg Hx   . Hyperlipidemia Neg Hx   . Hypertension Neg Hx     Social History   Social History Narrative   Occupation: Sr Artist   Married   Will 22     [name] 18      Caffeine Use:  Rarely   Regular exercise:  4 x weekly              Review of Systems General: Denies fevers, chills, weight loss CV: Denies chest pain, shortness of breath, palpitations  Physical Exam Vitals with BMI 10/26/2020 07/28/2020 07/28/2020  Height 5\' 0"  - -  Weight 131 lbs 3 oz - -   BMI 25.62 - -  Systolic 139 132  Diastolic 83 57 59  Pulse 73 74 67    General:  No acute distress,  Alert and oriented, Non-Toxic, Normal speech and affect On examination there is a very faint subtle subcutaneous nodule in the left chest.  I do not readily detect any overlying skin changes.  The size of the nodule is about 1 cm.  Assessment/Plan Patient presents with a 1 cm subcutaneous nodule in the left chest.  I do not suspect any malignant pathology but patient is highly concerned about it and very motivated to have it removed to put her mind at ease.  We discussed excision.  We discussed the risks include bleeding, infection, damage to surrounding structures need for additional procedures.  All her questions were answered we will plan to move ahead with local excision.  784 10/26/2020, 1:48 PM

## 2020-11-01 DIAGNOSIS — S42001D Fracture of unspecified part of right clavicle, subsequent encounter for fracture with routine healing: Secondary | ICD-10-CM | POA: Diagnosis not present

## 2020-11-01 DIAGNOSIS — R531 Weakness: Secondary | ICD-10-CM | POA: Diagnosis not present

## 2020-11-01 DIAGNOSIS — M25611 Stiffness of right shoulder, not elsewhere classified: Secondary | ICD-10-CM | POA: Diagnosis not present

## 2020-11-03 DIAGNOSIS — R531 Weakness: Secondary | ICD-10-CM | POA: Diagnosis not present

## 2020-11-03 DIAGNOSIS — M25611 Stiffness of right shoulder, not elsewhere classified: Secondary | ICD-10-CM | POA: Diagnosis not present

## 2020-11-03 DIAGNOSIS — S42001D Fracture of unspecified part of right clavicle, subsequent encounter for fracture with routine healing: Secondary | ICD-10-CM | POA: Diagnosis not present

## 2020-11-08 DIAGNOSIS — R531 Weakness: Secondary | ICD-10-CM | POA: Diagnosis not present

## 2020-11-08 DIAGNOSIS — M25611 Stiffness of right shoulder, not elsewhere classified: Secondary | ICD-10-CM | POA: Diagnosis not present

## 2020-11-08 DIAGNOSIS — S42001D Fracture of unspecified part of right clavicle, subsequent encounter for fracture with routine healing: Secondary | ICD-10-CM | POA: Diagnosis not present

## 2020-11-10 DIAGNOSIS — R531 Weakness: Secondary | ICD-10-CM | POA: Diagnosis not present

## 2020-11-10 DIAGNOSIS — M25611 Stiffness of right shoulder, not elsewhere classified: Secondary | ICD-10-CM | POA: Diagnosis not present

## 2020-11-10 DIAGNOSIS — S42001D Fracture of unspecified part of right clavicle, subsequent encounter for fracture with routine healing: Secondary | ICD-10-CM | POA: Diagnosis not present

## 2020-11-15 ENCOUNTER — Encounter: Payer: Self-pay | Admitting: Family Medicine

## 2020-11-15 MED ORDER — ESOMEPRAZOLE MAGNESIUM 40 MG PO CPDR: 40.0000 mg | DELAYED_RELEASE_CAPSULE | Freq: Every day | ORAL | 1 refills | Status: DC

## 2020-11-17 DIAGNOSIS — R531 Weakness: Secondary | ICD-10-CM | POA: Diagnosis not present

## 2020-11-17 DIAGNOSIS — M25611 Stiffness of right shoulder, not elsewhere classified: Secondary | ICD-10-CM | POA: Diagnosis not present

## 2020-11-17 DIAGNOSIS — S42001D Fracture of unspecified part of right clavicle, subsequent encounter for fracture with routine healing: Secondary | ICD-10-CM | POA: Diagnosis not present

## 2020-11-22 DIAGNOSIS — R531 Weakness: Secondary | ICD-10-CM | POA: Diagnosis not present

## 2020-11-22 DIAGNOSIS — M25611 Stiffness of right shoulder, not elsewhere classified: Secondary | ICD-10-CM | POA: Diagnosis not present

## 2020-11-22 DIAGNOSIS — S42001D Fracture of unspecified part of right clavicle, subsequent encounter for fracture with routine healing: Secondary | ICD-10-CM | POA: Diagnosis not present

## 2020-11-23 ENCOUNTER — Encounter: Payer: Self-pay | Admitting: Plastic Surgery

## 2020-11-23 ENCOUNTER — Ambulatory Visit: Payer: BC Managed Care – PPO | Admitting: Plastic Surgery

## 2020-11-23 ENCOUNTER — Other Ambulatory Visit: Payer: Self-pay

## 2020-11-23 VITALS — BP 140/85 | HR 75 | Ht 60.0 in | Wt 127.6 lb

## 2020-11-23 DIAGNOSIS — H02834 Dermatochalasis of left upper eyelid: Secondary | ICD-10-CM

## 2020-11-23 DIAGNOSIS — H02831 Dermatochalasis of right upper eyelid: Secondary | ICD-10-CM | POA: Diagnosis not present

## 2020-11-23 NOTE — Progress Notes (Signed)
Referring Provider Sheliah Hatch, MD [address] [place],  [place] [address]   CC:  Chief Complaint  Patient presents with  . Advice Only      Kaylee Pratt is an 65 y.o. female.  HPI: Patient is here to discuss her upper eyelids.  She is bothered by the excess skin.  She says over the course the day her eyes become tired and it is difficult to see in the superior aspect of her visual field.  She also feels like there is asymmetry in the amount of excess skin with the left upper lid having more.  She is interested in surgical treatment.  Allergies  Allergen Reactions  . Amoxil [Amoxicillin] Rash  . Iohexol Hives    Pt developed hives on her face and chest after IV contrast     Outpatient Encounter Medications as of 11/23/2020  Medication Sig  . acetaminophen (TYLENOL) 325 MG tablet Take 2 tablets (650 mg total) by mouth every 6 (six) hours as needed for mild pain (or Fever >/= 101).  . ADVAIR DISKUS 250-50 MCG/DOSE AEPB Inhale 1 puff into the lungs 2 (two) times daily.  [name] Kitchen albuterol (VENTOLIN HFA) 108 (90 Base) MCG/ACT inhaler INHALE 2 PUFFS INTO THE LUNGS EVERY 6 HOURS AS NEEDED FOR WHEEZING OR SHORTNESS OF BREATH  . cholecalciferol (VITAMIN D) 1000 UNITS tablet Take 2,000 Units by mouth daily.  . clonazePAM (KLONOPIN) 1 MG tablet 1 tab po BID prn anxiety  . escitalopram (LEXAPRO) 20 MG tablet Take 1 tablet (20 mg total) by mouth daily.  [name] Kitchen esomeprazole (NEXIUM) 40 MG capsule Take 1 capsule (40 mg total) by mouth daily.  . simvastatin (ZOCOR) 20 MG tablet TAKE 1 TABLET BY MOUTH EVERYDAY AT BEDTIME (Patient taking differently: Take 10 mg by mouth 2 (two) times a week.)   No facility-administered encounter medications on file as of 11/23/2020.     Past Medical History:  Diagnosis Date  . Allergy   . Asthma   . Depression   . Duodenal diverticulum 07/13/2020   GI bleed with multi blood transfusions  . GERD (gastroesophageal reflux disease)   . GERD (gastroesophageal  reflux disease)   . Hiatal hernia   . Hyperlipidemia   . Osteoporosis     Past Surgical History:  Procedure Laterality Date  . COLONOSCOPY WITH PROPOFOL N/A 07/13/2020   Procedure: COLONOSCOPY WITH PROPOFOL;  Surgeon: Beverley Fiedler, MD;  Location: [facility];  Service: Gastroenterology;  Laterality: N/A;  . ESOPHAGOGASTRODUODENOSCOPY (EGD) WITH PROPOFOL N/A 07/13/2020   Procedure: ESOPHAGOGASTRODUODENOSCOPY (EGD) WITH PROPOFOL;  Surgeon: Beverley Fiedler, MD;  Location: [facility];  Service: Gastroenterology;  Laterality: N/A;  . ORIF CLAVICULAR FRACTURE Right 07/28/2020   Procedure: OPEN REDUCTION INTERNAL FIXATION (ORIF) CLAVICULAR FRACTURE;  Surgeon: Jones Broom, MD;  Location: [facility];  Service: Orthopedics;  Laterality: Right;  . TUBAL LIGATION      Family History  Problem Relation Age of Onset  . Prostate cancer Father   . Other Neg Hx        no early CVA/CAD, no cancer  . Sudden death Neg Hx   . Diabetes Neg Hx   . Heart attack Neg Hx   . Hyperlipidemia Neg Hx   . Hypertension Neg Hx     Social History   Social History Narrative   Occupation: Sr Artist   Married   Will 22     [name] 18      Caffeine Use:  Rarely   Regular exercise:  4 x weekly              Review of Systems General: Denies fevers, chills, weight loss CV: Denies chest pain, shortness of breath, palpitations  Physical Exam Vitals with BMI 11/23/2020 10/26/2020 07/28/2020  Height 5\' 0"  5\' 0"  -  Weight 127 lbs 10 oz 131 lbs 3 oz -  BMI 24.92 25.62 -  Systolic 140 139  Diastolic 85 83 57  Pulse 75 73 74    General:  No acute distress,  Alert and oriented, Non-Toxic, Normal speech and affect On examination extraocular movements intact.  Pupils equal round reactive to light.  Her eyelid margins are symmetric and she seems to have good levator function.  Her MRD 1 is about 2 mm.  She has significant excess dermatochalasis with the skin resting on the lashes on the left side.  Her  brows seem to be fairly symmetric with symmetric frontalis function.  She does point out a scar in the lateral aspect of her right forehead which is a little pink and a little depressed.  Assessment/Plan Patient is a reasonable candidate for upper lid blepharoplasty.  We discussed the risks include bleeding, infection, damage surrounding structures and need for additional procedures.  She has not had any previous eye procedures or dry eye symptoms.  I explained the details of the procedure to her.  We will plan to move forward and seek insurance approval given her upper visual field symptoms.  All of her questions were answered.  11/23/2020, 4:29 PM

## 2020-11-29 DIAGNOSIS — S42001D Fracture of unspecified part of right clavicle, subsequent encounter for fracture with routine healing: Secondary | ICD-10-CM | POA: Diagnosis not present

## 2020-11-29 DIAGNOSIS — R531 Weakness: Secondary | ICD-10-CM | POA: Diagnosis not present

## 2020-11-29 DIAGNOSIS — M25611 Stiffness of right shoulder, not elsewhere classified: Secondary | ICD-10-CM | POA: Diagnosis not present

## 2020-11-30 ENCOUNTER — Ambulatory Visit: Payer: BC Managed Care – PPO | Admitting: Plastic Surgery

## 2020-11-30 ENCOUNTER — Other Ambulatory Visit (HOSPITAL_COMMUNITY)
Admission: RE | Admit: 2020-11-30 | Discharge: 2020-11-30 | Disposition: A | Discharge status: Home / Self Care | Payer: BC Managed Care – PPO | Source: Ambulatory Visit | Attending: Plastic Surgery | Admitting: Plastic Surgery

## 2020-11-30 ENCOUNTER — Encounter: Payer: Self-pay | Admitting: Plastic Surgery

## 2020-11-30 ENCOUNTER — Other Ambulatory Visit: Payer: Self-pay

## 2020-11-30 VITALS — BP 129/77 | HR 75

## 2020-11-30 DIAGNOSIS — L989 Disorder of the skin and subcutaneous tissue, unspecified: Secondary | ICD-10-CM | POA: Diagnosis not present

## 2020-11-30 DIAGNOSIS — D171 Benign lipomatous neoplasm of skin and subcutaneous tissue of trunk: Secondary | ICD-10-CM | POA: Diagnosis not present

## 2020-11-30 NOTE — Progress Notes (Signed)
Operative Note   DATE OF OPERATION: 11/30/2020  LOCATION:    SURGICAL DEPARTMENT: Plastic Surgery  PREOPERATIVE DIAGNOSES: Left chest cystic lesion  POSTOPERATIVE DIAGNOSES:  same  PROCEDURE:  1. Excision of left chest cystic lesion measuring 3.5 cm 2. Complex closure measuring 3.5 cm  SURGEON: Ancil Linsey, MD  ANESTHESIA:  Local  COMPLICATIONS: None.   INDICATIONS FOR PROCEDURE:  The patient, Kaylee Pratt is a 64 y.o. female born on [birth year], is here for treatment of subtle left chest cystic lesion MRN: [id number]  CONSENT:  Informed consent was obtained directly from the patient. Risks, benefits and alternatives were fully discussed. Specific risks including but not limited to bleeding, infection, hematoma, seroma, scarring, pain, infection, wound healing problems, and need for further surgery were all discussed. The patient did have an ample opportunity to have questions answered to satisfaction.   DESCRIPTION OF PROCEDURE:  Local anesthesia was administered. The patient's operative site was prepped and draped in a sterile fashion. A time out was performed and all information was confirmed to be correct.  The lesion was excised with a 15 blade.  Hemostasis was obtained.  Circumferential undermining was performed and the skin was advanced and closed in layers with interrupted buried Monocryl sutures and 5-0 fast gut for the skin.  The lesion excised measured 3.5 cm, and the total length of closure measured 3.5 cm.    The patient tolerated the procedure well.  There were no complications.

## 2020-12-06 LAB — SURGICAL PATHOLOGY

## 2020-12-07 DIAGNOSIS — Z09 Encounter for follow-up examination after completed treatment for conditions other than malignant neoplasm: Secondary | ICD-10-CM | POA: Diagnosis not present

## 2020-12-07 DIAGNOSIS — S42001D Fracture of unspecified part of right clavicle, subsequent encounter for fracture with routine healing: Secondary | ICD-10-CM | POA: Diagnosis not present

## 2020-12-07 DIAGNOSIS — M25611 Stiffness of right shoulder, not elsewhere classified: Secondary | ICD-10-CM | POA: Diagnosis not present

## 2020-12-07 DIAGNOSIS — R531 Weakness: Secondary | ICD-10-CM | POA: Diagnosis not present

## 2020-12-15 DIAGNOSIS — Z961 Presence of intraocular lens: Secondary | ICD-10-CM | POA: Diagnosis not present

## 2020-12-15 DIAGNOSIS — H524 Presbyopia: Secondary | ICD-10-CM | POA: Diagnosis not present

## 2020-12-16 ENCOUNTER — Encounter: Payer: BC Managed Care – PPO | Admitting: Surgical

## 2020-12-16 ENCOUNTER — Encounter: Payer: Self-pay | Admitting: Surgical

## 2020-12-16 ENCOUNTER — Ambulatory Visit (INDEPENDENT_AMBULATORY_CARE_PROVIDER_SITE_OTHER): Payer: Self-pay | Admitting: Surgical

## 2020-12-16 ENCOUNTER — Other Ambulatory Visit: Payer: Self-pay

## 2020-12-16 VITALS — BP 133/75 | HR 70 | Ht 60.0 in | Wt 123.0 lb

## 2020-12-16 DIAGNOSIS — H02834 Dermatochalasis of left upper eyelid: Secondary | ICD-10-CM

## 2020-12-16 DIAGNOSIS — Z719 Counseling, unspecified: Secondary | ICD-10-CM

## 2020-12-16 DIAGNOSIS — L989 Disorder of the skin and subcutaneous tissue, unspecified: Secondary | ICD-10-CM

## 2020-12-16 DIAGNOSIS — H02831 Dermatochalasis of right upper eyelid: Secondary | ICD-10-CM

## 2020-12-16 MED ORDER — TRAMADOL HCL 50 MG PO TABS
50.0000 mg | ORAL_TABLET | Freq: Three times a day (TID) | ORAL | 0 refills | Status: AC | PRN
Start: 2020-12-16 — End: 2020-12-21

## 2020-12-16 MED ORDER — ONDANSETRON HCL 4 MG PO TABS: 4.0000 mg | ORAL_TABLET | Freq: Three times a day (TID) | ORAL | 0 refills | Status: DC | PRN

## 2020-12-16 NOTE — Progress Notes (Signed)
Patient ID: Kaylee Pratt, female    DOB: 07-20-1956, 65 y.o.   MRN: [id number]  No chief complaint on file.     ICD-10-CM   1. Skin lesion  L98.9   2. Dermatochalasis of both upper eyelids  H02.831    H02.834     History of Present Illness: Kaylee Pratt is a 65 y.o.  female  with a history of dermatochalasis of bilateral upper eyelids.  She presents for preoperative evaluation for upcoming procedure, bilateral upper eyelid blepharoplasty, scheduled for 12/30/2020 with Dr. Arita Miss.  She is also here for follow-up after excision of a skin lesion on 11/30/2020 with Dr. Arita Miss.  Lesion was on her left chest.  Pathology showed lipoma.  The patient has not had problems with anesthesia. No history of DVT/PE.  No family history of DVT/PE.  No family or personal history of bleeding or clotting disorders.  Patient is not currently taking any blood thinners.  No history of CVA/MI.   PMH Significant for: Asthma, GERD, history of GI bleed. Hyperlipidemia.  Patient reports she is doing well, reports November of last year she had a GI bleed and also fell and broke her right clavicle.  She reports that she has been doing well since and has had no issues with this.   Past Medical History: Allergies: Allergies  Allergen Reactions  . Amoxil [Amoxicillin] Rash  . Iohexol Hives    Pt developed hives on her face and chest after IV contrast     Current Medications:  Current Outpatient Medications:  .  acetaminophen (TYLENOL) 325 MG tablet, Take 2 tablets (650 mg total) by mouth every 6 (six) hours as needed for mild pain (or Fever >/= 101)., Disp: , Rfl:  .  ADVAIR DISKUS 250-50 MCG/DOSE AEPB, Inhale 1 puff into the lungs 2 (two) times daily., Disp: , Rfl:  .  albuterol (VENTOLIN HFA) 108 (90 Base) MCG/ACT inhaler, INHALE 2 PUFFS INTO THE LUNGS EVERY 6 HOURS AS NEEDED FOR WHEEZING OR SHORTNESS OF BREATH, Disp: 18 g, Rfl: 3 .  cholecalciferol (VITAMIN D) 1000 UNITS tablet, Take 2,000 Units by mouth daily.,  Disp: , Rfl:  .  clonazePAM (KLONOPIN) 1 MG tablet, 1 tab po BID prn anxiety, Disp: 90 tablet, Rfl: 1 .  escitalopram (LEXAPRO) 20 MG tablet, Take 1 tablet (20 mg total) by mouth daily., Disp: 90 tablet, Rfl: 0 .  ondansetron (ZOFRAN) 4 MG tablet, Take 1 tablet (4 mg total) by mouth every 8 (eight) hours as needed for nausea or vomiting., Disp: 20 tablet, Rfl: 0 .  simvastatin (ZOCOR) 20 MG tablet, TAKE 1 TABLET BY MOUTH EVERYDAY AT BEDTIME (Patient taking differently: Take 10 mg by mouth 2 (two) times a week.), Disp: 90 tablet, Rfl: 1 .  traMADol (ULTRAM) 50 MG tablet, Take 1 tablet (50 mg total) by mouth every 8 (eight) hours as needed for up to 5 days., Disp: 15 tablet, Rfl: 0  Past Medical Problems: Past Medical History:  Diagnosis Date  . Allergy   . Asthma   . Depression   . Duodenal diverticulum 07/13/2020   GI bleed with multi blood transfusions  . GERD (gastroesophageal reflux disease)   . GERD (gastroesophageal reflux disease)   . Hiatal hernia   . Hyperlipidemia   . Osteoporosis     Past Surgical History: Past Surgical History:  Procedure Laterality Date  . COLONOSCOPY WITH PROPOFOL N/A 07/13/2020   Procedure: COLONOSCOPY WITH PROPOFOL;  Surgeon: Beverley Fiedler, MD;  Location: [facility];  Service: Gastroenterology;  Laterality: N/A;  . ESOPHAGOGASTRODUODENOSCOPY (EGD) WITH PROPOFOL N/A 07/13/2020   Procedure: ESOPHAGOGASTRODUODENOSCOPY (EGD) WITH PROPOFOL;  Surgeon: Beverley Fiedler, MD;  Location: [facility];  Service: Gastroenterology;  Laterality: N/A;  . ORIF CLAVICULAR FRACTURE Right 07/28/2020   Procedure: OPEN REDUCTION INTERNAL FIXATION (ORIF) CLAVICULAR FRACTURE;  Surgeon: Jones Broom, MD;  Location: [facility];  Service: Orthopedics;  Laterality: Right;  . TUBAL LIGATION      Social History: Social History   Socioeconomic History  . Marital status: Married    Spouse name: Not on file  . Number of children: 2  . Years of education: Not on file  . Highest  education level: Not on file  Occupational History    Employer: [employer]  Tobacco Use  . Smoking status: Never Smoker  . Smokeless tobacco: Never Used  Vaping Use  . Vaping Use: Never used  Substance and Sexual Activity  . Alcohol use: Yes    Alcohol/week: 2.0 standard drinks    Types: 2 Glasses of wine per week    Comment: wine on occasion  . Drug use: No  . Sexual activity: Not on file  Other Topics Concern  . Not on file  Social History Narrative   Occupation: Sr Artist   Married   Will 22     [name] 18      Caffeine Use:  Rarely   Regular exercise:  4 x weekly            Social Determinants of Corporate investment banker Strain: Not on file  Food Insecurity: Not on file  Transportation Needs: Not on file  Physical Activity: Not on file  Stress: Not on file  Social Connections: Not on file  Intimate Partner Violence: Not on file    Family History: Family History  Problem Relation Age of Onset  . Prostate cancer Father   . Other Neg Hx        no early CVA/CAD, no cancer  . Sudden death Neg Hx   . Diabetes Neg Hx   . Heart attack Neg Hx   . Hyperlipidemia Neg Hx   . Hypertension Neg Hx     Review of Systems: Review of Systems  Constitutional: Negative.   Eyes: Negative.   Respiratory: Negative.   Cardiovascular: Negative.   Neurological: Negative.     Physical Exam: Vital Signs BP 133/75 (BP Location: Left Arm, Patient Position: Sitting, Cuff Size: Normal)   Pulse 70   Ht 5' (1.524 m)   Wt 123 lb (55.8 kg)   BMI 24.02 kg/m   Physical Exam Constitutional:      General: Not in acute distress.    Appearance: Normal appearance. Not ill-appearing.  HENT:     Head: Normocephalic and atraumatic.  Eyes:     Pupils: Pupils are equal, round Neck:     Musculoskeletal: Normal range of motion.  Cardiovascular:     Rate and Rhythm: Normal rate    Pulses: Normal pulses.  Pulmonary:     Effort: Pulmonary effort is normal. No  respiratory distress.  Abdominal:     General: Abdomen is flat. There is no distension.  Musculoskeletal: Normal range of motion.  Skin:    General: Skin is warm and dry.     Findings: No erythema or rash.  Neurological:     General: No focal deficit present.     Mental Status: Alert and oriented to person, place,  and time. Mental status is at baseline.     Motor: No weakness.  Psychiatric:        Mood and Affect: Mood normal.        Behavior: Behavior normal.    Assessment/Plan: The patient is scheduled for bilateral upper eyelid blepharoplasty with Dr. Arita Miss.  Risks, benefits, and alternatives of procedure discussed, questions answered and consent obtained.   Smoking Status: Non-smoker; Counseling Given?  N/A  Caprini Score: 4, moderate; Risk Factors include: Age and length of planned surgery. Recommendation for mechanical And pharmacological prophylaxis while hospitalized. Encourage early ambulation.   Pictures obtained: 11/23/2020  Post-op Rx sent to pharmacy: Norco, Zofran  Patient was provided with the General Surgical Risk consent document and Pain Medication Agreement prior to their appointment.  They had adequate time to read through the risk consent documents and Pain Medication Agreement. We also discussed them in person together during this preop appointment. All of their questions were answered to their satisfaction.  Recommended calling if they have any further questions.  Risk consent form and Pain Medication Agreement to be scanned into patient's chart.  The risks that can be encountered with and after a blepharoplasty were discussed and include the following but no limited to these:  Asymmetry, dry eyes, lid lag, sensitivity to sun or bright light, difficulty closing your eyes, outward rolling of the eyelid, change in vision, fluid accumulation, firmness of the area, fat necrosis with death of fat tissue, bleeding, infection, delayed healing, anesthesia risks, skin  sensation changes, injury to structures including nerves, blood vessels, and muscles which may be temporary or permanent, hair loss, allergies to tape, suture materials and glues, blood products, topical preparations or injected agents, skin and contour irregularities, skin discoloration and swelling, deep vein thrombosis, cardiac and pulmonary complications, pain, which may persist, persistent pain, recurrence, poor healing of the incision, possible need for revisional surgery or staged procedures. Thiere can also be persistent swelling, poor wound healing, rippling or loose skin, swelling. Any change in weight fluctuations can alter the outcome.   Electronically signed by: Kermit Balo Nandita Mathenia, PA-C 12/16/2020 2:02 PM

## 2020-12-30 ENCOUNTER — Telehealth: Payer: Self-pay

## 2020-12-30 ENCOUNTER — Encounter: Payer: Self-pay | Admitting: Plastic Surgery

## 2020-12-30 ENCOUNTER — Telehealth: Payer: Self-pay | Admitting: Plastic Surgery

## 2020-12-30 NOTE — Telephone Encounter (Signed)
Matt responded to patient.

## 2020-12-30 NOTE — Telephone Encounter (Signed)
Patient called to say that she had surgery with [place] today but she doesn't have a copy of her post-op instructions.  Patient said that we can upload a copy to her MyChart account.  Please call.

## 2020-12-30 NOTE — Telephone Encounter (Signed)
Patient called to say she had bleph surgery this morning and was told to put ointment on her incisions. She is unsure of the kind of ointment. Please call her to advise the type of ointment to use two times a day.

## 2021-01-05 ENCOUNTER — Other Ambulatory Visit: Payer: Self-pay

## 2021-01-05 ENCOUNTER — Ambulatory Visit (INDEPENDENT_AMBULATORY_CARE_PROVIDER_SITE_OTHER): Payer: Self-pay | Admitting: Plastic Surgery

## 2021-01-05 DIAGNOSIS — H02831 Dermatochalasis of right upper eyelid: Secondary | ICD-10-CM

## 2021-01-05 DIAGNOSIS — H02834 Dermatochalasis of left upper eyelid: Secondary | ICD-10-CM

## 2021-01-05 NOTE — Progress Notes (Signed)
Patient presents 1 week postop from bilateral upper lid blepharoplasty.  She overall feels like she is doing well.  She did have some itching which she felt was due to allergies she has had a hard time controlling.  On exam everything looks to be healing fine.  The remaining suture knots were clipped.  She has a mild amount of swelling and bruising which would be normal for this point in her healing process.  Her eyelid margins and symmetry are good.  There is no signs of scleral irritation.  We will have her continue to gently clean the area and apply a small amount of Vaseline as needed and follow-up again in several weeks.

## 2021-01-08 HISTORY — PX: BREAST EXCISIONAL BIOPSY: SUR124

## 2021-01-09 DIAGNOSIS — J301 Allergic rhinitis due to pollen: Secondary | ICD-10-CM | POA: Diagnosis not present

## 2021-01-09 DIAGNOSIS — J3089 Other allergic rhinitis: Secondary | ICD-10-CM | POA: Diagnosis not present

## 2021-01-09 DIAGNOSIS — J3081 Allergic rhinitis due to animal (cat) (dog) hair and dander: Secondary | ICD-10-CM | POA: Diagnosis not present

## 2021-01-10 DIAGNOSIS — J3081 Allergic rhinitis due to animal (cat) (dog) hair and dander: Secondary | ICD-10-CM | POA: Diagnosis not present

## 2021-01-10 DIAGNOSIS — J3089 Other allergic rhinitis: Secondary | ICD-10-CM | POA: Diagnosis not present

## 2021-01-10 DIAGNOSIS — J301 Allergic rhinitis due to pollen: Secondary | ICD-10-CM | POA: Diagnosis not present

## 2021-01-19 ENCOUNTER — Other Ambulatory Visit: Payer: Self-pay

## 2021-01-19 ENCOUNTER — Encounter: Payer: Self-pay | Admitting: Plastic Surgery

## 2021-01-19 ENCOUNTER — Ambulatory Visit (INDEPENDENT_AMBULATORY_CARE_PROVIDER_SITE_OTHER): Payer: BC Managed Care – PPO | Admitting: Plastic Surgery

## 2021-01-19 DIAGNOSIS — J3081 Allergic rhinitis due to animal (cat) (dog) hair and dander: Secondary | ICD-10-CM | POA: Diagnosis not present

## 2021-01-19 DIAGNOSIS — H02834 Dermatochalasis of left upper eyelid: Secondary | ICD-10-CM

## 2021-01-19 DIAGNOSIS — J3089 Other allergic rhinitis: Secondary | ICD-10-CM | POA: Diagnosis not present

## 2021-01-19 DIAGNOSIS — J301 Allergic rhinitis due to pollen: Secondary | ICD-10-CM | POA: Diagnosis not present

## 2021-01-19 DIAGNOSIS — H02831 Dermatochalasis of right upper eyelid: Secondary | ICD-10-CM

## 2021-01-19 NOTE — Progress Notes (Signed)
Patient presents about 3 weeks postop from bilateral upper lid blepharoplasty.  She feels like things are going well.  On exam she is healed nicely.  There is no lagophthalmos.  There is still some faint upper lid edema just above her incision line but I suspect this will resolve with time.  She is planning to massage this area and call us if she has any questions.  She is overall very happy.  We will plan to see her again on an as-needed basis.

## 2021-01-25 DIAGNOSIS — J3089 Other allergic rhinitis: Secondary | ICD-10-CM | POA: Diagnosis not present

## 2021-01-25 DIAGNOSIS — J3081 Allergic rhinitis due to animal (cat) (dog) hair and dander: Secondary | ICD-10-CM | POA: Diagnosis not present

## 2021-01-25 DIAGNOSIS — J301 Allergic rhinitis due to pollen: Secondary | ICD-10-CM | POA: Diagnosis not present

## 2021-02-09 DIAGNOSIS — J3089 Other allergic rhinitis: Secondary | ICD-10-CM | POA: Diagnosis not present

## 2021-02-09 DIAGNOSIS — J301 Allergic rhinitis due to pollen: Secondary | ICD-10-CM | POA: Diagnosis not present

## 2021-02-09 DIAGNOSIS — J3081 Allergic rhinitis due to animal (cat) (dog) hair and dander: Secondary | ICD-10-CM | POA: Diagnosis not present

## 2021-02-28 ENCOUNTER — Other Ambulatory Visit: Payer: Self-pay | Admitting: Family Medicine

## 2021-03-02 DIAGNOSIS — J301 Allergic rhinitis due to pollen: Secondary | ICD-10-CM | POA: Diagnosis not present

## 2021-03-02 DIAGNOSIS — J3081 Allergic rhinitis due to animal (cat) (dog) hair and dander: Secondary | ICD-10-CM | POA: Diagnosis not present

## 2021-03-02 DIAGNOSIS — J3089 Other allergic rhinitis: Secondary | ICD-10-CM | POA: Diagnosis not present

## 2021-03-08 ENCOUNTER — Encounter: Payer: Self-pay | Admitting: *Deleted

## 2021-03-09 DIAGNOSIS — J301 Allergic rhinitis due to pollen: Secondary | ICD-10-CM | POA: Diagnosis not present

## 2021-03-09 DIAGNOSIS — J3089 Other allergic rhinitis: Secondary | ICD-10-CM | POA: Diagnosis not present

## 2021-03-09 DIAGNOSIS — J3081 Allergic rhinitis due to animal (cat) (dog) hair and dander: Secondary | ICD-10-CM | POA: Diagnosis not present

## 2021-03-16 DIAGNOSIS — J301 Allergic rhinitis due to pollen: Secondary | ICD-10-CM | POA: Diagnosis not present

## 2021-03-16 DIAGNOSIS — J3081 Allergic rhinitis due to animal (cat) (dog) hair and dander: Secondary | ICD-10-CM | POA: Diagnosis not present

## 2021-03-16 DIAGNOSIS — J3089 Other allergic rhinitis: Secondary | ICD-10-CM | POA: Diagnosis not present

## 2021-03-23 DIAGNOSIS — J3081 Allergic rhinitis due to animal (cat) (dog) hair and dander: Secondary | ICD-10-CM | POA: Diagnosis not present

## 2021-03-23 DIAGNOSIS — J3089 Other allergic rhinitis: Secondary | ICD-10-CM | POA: Diagnosis not present

## 2021-03-23 DIAGNOSIS — J301 Allergic rhinitis due to pollen: Secondary | ICD-10-CM | POA: Diagnosis not present

## 2021-03-30 DIAGNOSIS — J3081 Allergic rhinitis due to animal (cat) (dog) hair and dander: Secondary | ICD-10-CM | POA: Diagnosis not present

## 2021-03-30 DIAGNOSIS — J301 Allergic rhinitis due to pollen: Secondary | ICD-10-CM | POA: Diagnosis not present

## 2021-03-30 DIAGNOSIS — J3089 Other allergic rhinitis: Secondary | ICD-10-CM | POA: Diagnosis not present

## 2021-04-11 DIAGNOSIS — J3081 Allergic rhinitis due to animal (cat) (dog) hair and dander: Secondary | ICD-10-CM | POA: Diagnosis not present

## 2021-04-11 DIAGNOSIS — J3089 Other allergic rhinitis: Secondary | ICD-10-CM | POA: Diagnosis not present

## 2021-04-11 DIAGNOSIS — J301 Allergic rhinitis due to pollen: Secondary | ICD-10-CM | POA: Diagnosis not present

## 2021-04-19 ENCOUNTER — Other Ambulatory Visit: Payer: Self-pay

## 2021-04-19 MED ORDER — ESCITALOPRAM OXALATE 20 MG PO TABS
20.0000 mg | ORAL_TABLET | Freq: Every day | ORAL | 1 refills | Status: DC
Start: 2021-04-19 — End: 2021-10-02

## 2021-04-19 NOTE — Telephone Encounter (Signed)
Medication last filled by a historical provider. LOV 07/18/20 NOV 05/17/21

## 2021-05-05 DIAGNOSIS — J3089 Other allergic rhinitis: Secondary | ICD-10-CM | POA: Diagnosis not present

## 2021-05-05 DIAGNOSIS — J3081 Allergic rhinitis due to animal (cat) (dog) hair and dander: Secondary | ICD-10-CM | POA: Diagnosis not present

## 2021-05-05 DIAGNOSIS — J301 Allergic rhinitis due to pollen: Secondary | ICD-10-CM | POA: Diagnosis not present

## 2021-05-10 DIAGNOSIS — J3081 Allergic rhinitis due to animal (cat) (dog) hair and dander: Secondary | ICD-10-CM | POA: Diagnosis not present

## 2021-05-10 DIAGNOSIS — J301 Allergic rhinitis due to pollen: Secondary | ICD-10-CM | POA: Diagnosis not present

## 2021-05-10 DIAGNOSIS — J3089 Other allergic rhinitis: Secondary | ICD-10-CM | POA: Diagnosis not present

## 2021-05-17 ENCOUNTER — Encounter: Payer: BC Managed Care – PPO | Admitting: Family Medicine

## 2021-05-23 ENCOUNTER — Other Ambulatory Visit: Payer: Self-pay | Admitting: Family Medicine

## 2021-05-24 DIAGNOSIS — J301 Allergic rhinitis due to pollen: Secondary | ICD-10-CM | POA: Diagnosis not present

## 2021-05-24 DIAGNOSIS — J3089 Other allergic rhinitis: Secondary | ICD-10-CM | POA: Diagnosis not present

## 2021-05-24 DIAGNOSIS — J3081 Allergic rhinitis due to animal (cat) (dog) hair and dander: Secondary | ICD-10-CM | POA: Diagnosis not present

## 2021-05-24 NOTE — Telephone Encounter (Signed)
Requesting:Klonopin Contract: UDS: Last Visit:07/18/20 Next Visit:07/13/21 Last Refill:10/13/20 90 tabs 1 refills  Please Advise

## 2021-05-25 ENCOUNTER — Encounter: Payer: Self-pay | Admitting: Family Medicine

## 2021-05-25 NOTE — Telephone Encounter (Signed)
Please see patients mychart message and advise. Thanks.

## 2021-05-26 ENCOUNTER — Other Ambulatory Visit: Payer: Self-pay

## 2021-05-26 MED ORDER — PANTOPRAZOLE SODIUM 40 MG PO TBEC
40.0000 mg | DELAYED_RELEASE_TABLET | Freq: Every day | ORAL | 1 refills | Status: DC
Start: 2021-05-26 — End: 2021-10-02

## 2021-05-26 NOTE — Telephone Encounter (Signed)
Medication sent, patient is aware. 

## 2021-05-29 ENCOUNTER — Encounter: Payer: Self-pay | Admitting: Family Medicine

## 2021-05-29 DIAGNOSIS — J301 Allergic rhinitis due to pollen: Secondary | ICD-10-CM | POA: Diagnosis not present

## 2021-05-29 DIAGNOSIS — J3081 Allergic rhinitis due to animal (cat) (dog) hair and dander: Secondary | ICD-10-CM | POA: Diagnosis not present

## 2021-05-29 DIAGNOSIS — J3089 Other allergic rhinitis: Secondary | ICD-10-CM | POA: Diagnosis not present

## 2021-06-02 DIAGNOSIS — J301 Allergic rhinitis due to pollen: Secondary | ICD-10-CM | POA: Diagnosis not present

## 2021-06-02 DIAGNOSIS — J3081 Allergic rhinitis due to animal (cat) (dog) hair and dander: Secondary | ICD-10-CM | POA: Diagnosis not present

## 2021-06-02 DIAGNOSIS — J3089 Other allergic rhinitis: Secondary | ICD-10-CM | POA: Diagnosis not present

## 2021-06-03 ENCOUNTER — Other Ambulatory Visit: Payer: Self-pay | Admitting: Family Medicine

## 2021-06-07 ENCOUNTER — Other Ambulatory Visit (HOSPITAL_BASED_OUTPATIENT_CLINIC_OR_DEPARTMENT_OTHER): Payer: Self-pay | Admitting: Family Medicine

## 2021-06-07 DIAGNOSIS — Z1231 Encounter for screening mammogram for malignant neoplasm of breast: Secondary | ICD-10-CM

## 2021-06-07 DIAGNOSIS — J3089 Other allergic rhinitis: Secondary | ICD-10-CM | POA: Diagnosis not present

## 2021-06-07 DIAGNOSIS — J301 Allergic rhinitis due to pollen: Secondary | ICD-10-CM | POA: Diagnosis not present

## 2021-06-07 DIAGNOSIS — J3081 Allergic rhinitis due to animal (cat) (dog) hair and dander: Secondary | ICD-10-CM | POA: Diagnosis not present

## 2021-07-03 ENCOUNTER — Encounter (HOSPITAL_BASED_OUTPATIENT_CLINIC_OR_DEPARTMENT_OTHER): Payer: Self-pay

## 2021-07-03 ENCOUNTER — Ambulatory Visit (HOSPITAL_BASED_OUTPATIENT_CLINIC_OR_DEPARTMENT_OTHER)
Admission: RE | Admit: 2021-07-03 | Discharge: 2021-07-03 | Disposition: A | Discharge status: Home / Self Care | Payer: BC Managed Care – PPO | Source: Ambulatory Visit | Attending: Family Medicine | Admitting: Family Medicine

## 2021-07-03 ENCOUNTER — Other Ambulatory Visit: Payer: Self-pay

## 2021-07-03 DIAGNOSIS — Z1231 Encounter for screening mammogram for malignant neoplasm of breast: Secondary | ICD-10-CM | POA: Diagnosis not present

## 2021-07-06 ENCOUNTER — Encounter: Payer: Self-pay | Admitting: Family Medicine

## 2021-07-13 ENCOUNTER — Other Ambulatory Visit: Payer: Self-pay

## 2021-07-13 ENCOUNTER — Encounter: Payer: Self-pay | Admitting: Family Medicine

## 2021-07-13 ENCOUNTER — Ambulatory Visit (INDEPENDENT_AMBULATORY_CARE_PROVIDER_SITE_OTHER): Payer: BC Managed Care – PPO | Admitting: Family Medicine

## 2021-07-13 ENCOUNTER — Telehealth (HOSPITAL_BASED_OUTPATIENT_CLINIC_OR_DEPARTMENT_OTHER): Payer: Self-pay

## 2021-07-13 VITALS — BP 122/82 | HR 78 | Temp 98.1°F | Resp 16 | Ht 60.0 in | Wt 126.6 lb

## 2021-07-13 DIAGNOSIS — E785 Hyperlipidemia, unspecified: Secondary | ICD-10-CM

## 2021-07-13 DIAGNOSIS — Z23 Encounter for immunization: Secondary | ICD-10-CM | POA: Diagnosis not present

## 2021-07-13 DIAGNOSIS — Z0000 Encounter for general adult medical examination without abnormal findings: Secondary | ICD-10-CM | POA: Diagnosis not present

## 2021-07-13 DIAGNOSIS — M810 Age-related osteoporosis without current pathological fracture: Secondary | ICD-10-CM

## 2021-07-13 LAB — HEPATIC FUNCTION PANEL
ALT: 13 U/L (ref 0–35)
AST: 17 U/L (ref 0–37)
Albumin: 4.3 g/dL (ref 3.5–5.2)
Alkaline Phosphatase: 62 U/L (ref 39–117)
Bilirubin, Direct: 0.1 mg/dL (ref 0.0–0.3)
Total Bilirubin: 0.5 mg/dL (ref 0.2–1.2)
Total Protein: 7.1 g/dL (ref 6.0–8.3)

## 2021-07-13 LAB — CBC WITH DIFFERENTIAL/PLATELET
Basophils Absolute: 0 10*3/uL (ref 0.0–0.1)
Basophils Relative: 0.5 % (ref 0.0–3.0)
Eosinophils Absolute: 0.2 10*3/uL (ref 0.0–0.7)
Eosinophils Relative: 3.9 % (ref 0.0–5.0)
HCT: 41.2 % (ref 36.0–46.0)
Hemoglobin: 13.3 g/dL (ref 12.0–15.0)
Lymphocytes Relative: 36.2 % (ref 12.0–46.0)
Lymphs Abs: 1.9 10*3/uL (ref 0.7–4.0)
MCHC: 32.2 g/dL (ref 30.0–36.0)
MCV: 89.8 fl (ref 78.0–100.0)
Monocytes Absolute: 0.4 10*3/uL (ref 0.1–1.0)
Monocytes Relative: 7.3 % (ref 3.0–12.0)
Neutro Abs: 2.8 10*3/uL (ref 1.4–7.7)
Neutrophils Relative %: 52.1 % (ref 43.0–77.0)
Platelets: 261 10*3/uL (ref 150.0–400.0)
RBC: 4.59 Mil/uL (ref 3.87–5.11)
RDW: 14 % (ref 11.5–15.5)
WBC: 5.3 10*3/uL (ref 4.0–10.5)

## 2021-07-13 LAB — LIPID PANEL
Cholesterol: 187 mg/dL (ref 0–200)
HDL: 51.7 mg/dL (ref >39.00)
NonHDL: 135.55
Total CHOL/HDL Ratio: 4
Triglycerides: 213 mg/dL — ABNORMAL HIGH (ref 0.0–149.0)
VLDL: 42.6 mg/dL — ABNORMAL HIGH (ref 0.0–40.0)

## 2021-07-13 LAB — BASIC METABOLIC PANEL
BUN: 21 mg/dL (ref 6–23)
CO2: 28 mEq/L (ref 19–32)
Calcium: 9.2 mg/dL (ref 8.4–10.5)
Chloride: 105 mEq/L (ref 96–112)
Creatinine, Ser: 0.81 mg/dL (ref 0.40–1.20)
GFR: 76.51 mL/min (ref >60.00)
Glucose, Bld: 95 mg/dL (ref 70–99)
Potassium: 4.5 mEq/L (ref 3.5–5.1)
Sodium: 141 mEq/L (ref 135–145)

## 2021-07-13 LAB — TSH: TSH: 0.79 u[IU]/mL (ref 0.35–5.50)

## 2021-07-13 LAB — LDL CHOLESTEROL, DIRECT: Direct LDL: 96 mg/dL

## 2021-07-13 LAB — VITAMIN D 25 HYDROXY (VIT D DEFICIENCY, FRACTURES): VITD: 85.93 ng/mL (ref 30.00–100.00)

## 2021-07-13 NOTE — Assessment & Plan Note (Signed)
DEXA ordered.  Check Vit D.  If still has osteoporosis will restart Prolia

## 2021-07-13 NOTE — Progress Notes (Signed)
   Subjective:    Patient ID: Kaylee Pratt, female    DOB: 27-Mar-1956, 66 y.o.   MRN: [id number]  HPI CPE- UTD on colonoscopy, mammo, pap.  UTD on Tdap.  Due for flu shot.  Health Maintenance  Topic Date Due   COVID-19 Vaccine (4 - Booster for Moderna series) 09/02/2020   INFLUENZA VACCINE  04/10/2021   MAMMOGRAM  07/03/2022   PAP SMEAR-Modifier  05/09/2023   TETANUS/TDAP  05/08/2028   COLONOSCOPY (Pts 45-81yrs Insurance coverage will need to be confirmed)  07/13/2030   Hepatitis C Screening  Completed   HIV Screening  Completed   Zoster Vaccines- Shingrix  Completed   Pneumococcal Vaccine 18-34 Years old  Aged Out   HPV VACCINES  Aged Out      Review of Systems Patient reports no vision/ hearing changes, adenopathy,fever, weight change,  persistant/recurrent hoarseness , swallowing issues, chest pain, palpitations, edema, persistant/recurrent cough, hemoptysis, dyspnea (rest/exertional/paroxysmal nocturnal), gastrointestinal bleeding (melena, rectal bleeding), abdominal pain, significant heartburn, bowel changes, GU symptoms (dysuria, hematuria, incontinence), Gyn symptoms (abnormal  bleeding, pain),  syncope, focal weakness, memory loss, numbness & tingling, skin/hair/nail changes, abnormal bruising or bleeding, anxiety, or depression.   This visit occurred during the SARS-CoV-2 public health emergency.  Safety protocols were in place, including screening questions prior to the visit, additional usage of staff PPE, and extensive cleaning of exam room while observing appropriate contact time as indicated for disinfecting solutions.      Objective:   Physical Exam General Appearance:    Alert, cooperative, no distress, appears stated age  Head:    Normocephalic, without obvious abnormality, atraumatic  Eyes:    PERRL, conjunctiva/corneas clear, EOM's intact, fundi    benign, both eyes  Ears:    Normal TM's and external ear canals, both ears  Nose:   Deferred due to COVID  Throat:    Neck:   Supple, symmetrical, trachea midline, no adenopathy;    Thyroid: no enlargement/tenderness/nodules  Back:     Symmetric, no curvature, ROM normal, no CVA tenderness  Lungs:     Clear to auscultation bilaterally, respirations unlabored  Chest Wall:    No tenderness or deformity   Heart:    Regular rate and rhythm, S1 and S2 normal, no murmur, rub   or gallop  Breast Exam:    Deferred to mammo  Abdomen:     Soft, non-tender, bowel sounds active all four quadrants,    no masses, no organomegaly  Genitalia:    Deferred  Rectal:    Extremities:   Extremities normal, atraumatic, no cyanosis or edema  Pulses:   2+ and symmetric all extremities  Skin:   Skin color, texture, turgor normal, no rashes or lesions  Lymph nodes:   Cervical, supraclavicular, and axillary nodes normal  Neurologic:   CNII-XII intact, normal strength, sensation and reflexes    throughout          Assessment & Plan:

## 2021-07-13 NOTE — Patient Instructions (Signed)
Follow up in 6 months to recheck cholesterol We'll notify you of your lab results and make any changes if needed Keep up the good work on healthy diet and regular exercise- you look great! You can get your bone density at [facility] Call with any questions or concerns Stay Safe!  Stay Healthy! CONGRATS on the move!!!

## 2021-07-13 NOTE — Assessment & Plan Note (Signed)
Chronic problem.  Tolerating Simvastatin w/o difficulty.  Check labs.  Adjust meds prn  

## 2021-07-13 NOTE — Assessment & Plan Note (Signed)
Pt's PE WNL.  UTD on pap, mammo, colonoscopy, Tdap.  Flu shot given today.  Check labs.  Anticipatory guidance provided.  

## 2021-09-25 ENCOUNTER — Other Ambulatory Visit: Payer: Self-pay

## 2021-09-25 MED ORDER — SIMVASTATIN 20 MG PO TABS: 20.0000 mg | ORAL_TABLET | Freq: Every day | ORAL | 0 refills | Status: DC

## 2021-09-29 ENCOUNTER — Encounter: Payer: Self-pay | Admitting: Family Medicine

## 2021-10-02 ENCOUNTER — Other Ambulatory Visit: Payer: Self-pay

## 2021-10-02 MED ORDER — CLONAZEPAM 1 MG PO TABS: ORAL_TABLET | ORAL | 1 refills | Status: DC

## 2021-10-02 MED ORDER — ESCITALOPRAM OXALATE 20 MG PO TABS: 20.0000 mg | ORAL_TABLET | Freq: Every day | ORAL | 1 refills | Status: DC

## 2021-10-02 MED ORDER — ALBUTEROL SULFATE HFA 108 (90 BASE) MCG/ACT IN AERS: 2.0000 | INHALATION_SPRAY | Freq: Four times a day (QID) | RESPIRATORY_TRACT | 0 refills | Status: DC | PRN

## 2021-10-02 MED ORDER — PANTOPRAZOLE SODIUM 40 MG PO TBEC: 40.0000 mg | DELAYED_RELEASE_TABLET | Freq: Every day | ORAL | 1 refills | Status: DC

## 2021-10-02 MED ORDER — SIMVASTATIN 20 MG PO TABS: 20.0000 mg | ORAL_TABLET | Freq: Every day | ORAL | 0 refills | Status: DC

## 2021-11-06 ENCOUNTER — Other Ambulatory Visit (HOSPITAL_BASED_OUTPATIENT_CLINIC_OR_DEPARTMENT_OTHER): Payer: BC Managed Care – PPO

## 2021-11-14 ENCOUNTER — Encounter: Payer: Self-pay | Admitting: Family Medicine

## 2021-11-24 ENCOUNTER — Other Ambulatory Visit: Payer: Self-pay | Admitting: Family Medicine

## 2021-11-28 ENCOUNTER — Encounter: Payer: Self-pay | Admitting: Family Medicine

## 2021-11-28 ENCOUNTER — Ambulatory Visit (INDEPENDENT_AMBULATORY_CARE_PROVIDER_SITE_OTHER): Payer: Medicare HMO | Admitting: Family Medicine

## 2021-11-28 VITALS — BP 126/74 | HR 61 | Temp 98.3°F | Ht 60.0 in | Wt 125.6 lb

## 2021-11-28 DIAGNOSIS — E785 Hyperlipidemia, unspecified: Secondary | ICD-10-CM | POA: Diagnosis not present

## 2021-11-28 DIAGNOSIS — Z23 Encounter for immunization: Secondary | ICD-10-CM

## 2021-11-28 DIAGNOSIS — M810 Age-related osteoporosis without current pathological fracture: Secondary | ICD-10-CM | POA: Diagnosis not present

## 2021-11-28 DIAGNOSIS — Z0000 Encounter for general adult medical examination without abnormal findings: Secondary | ICD-10-CM | POA: Diagnosis not present

## 2021-11-28 DIAGNOSIS — M65332 Trigger finger, left middle finger: Secondary | ICD-10-CM

## 2021-11-28 LAB — BASIC METABOLIC PANEL
BUN: 22 mg/dL (ref 6–23)
CO2: 26 mEq/L (ref 19–32)
Calcium: 9.3 mg/dL (ref 8.4–10.5)
Chloride: 105 mEq/L (ref 96–112)
Creatinine, Ser: 0.68 mg/dL (ref 0.40–1.20)
GFR: 91.55 mL/min (ref >60.00)
Glucose, Bld: 88 mg/dL (ref 70–99)
Potassium: 4.6 mEq/L (ref 3.5–5.1)
Sodium: 140 mEq/L (ref 135–145)

## 2021-11-28 LAB — CBC WITH DIFFERENTIAL/PLATELET
Basophils Absolute: 0 10*3/uL (ref 0.0–0.1)
Basophils Relative: 0.3 % (ref 0.0–3.0)
Eosinophils Absolute: 0.2 10*3/uL (ref 0.0–0.7)
Eosinophils Relative: 3.5 % (ref 0.0–5.0)
HCT: 40.2 % (ref 36.0–46.0)
Hemoglobin: 13.2 g/dL (ref 12.0–15.0)
Lymphocytes Relative: 40.4 % (ref 12.0–46.0)
Lymphs Abs: 2.3 10*3/uL (ref 0.7–4.0)
MCHC: 32.8 g/dL (ref 30.0–36.0)
MCV: 89.9 fl (ref 78.0–100.0)
Monocytes Absolute: 0.5 10*3/uL (ref 0.1–1.0)
Monocytes Relative: 8 % (ref 3.0–12.0)
Neutro Abs: 2.8 10*3/uL (ref 1.4–7.7)
Neutrophils Relative %: 47.8 % (ref 43.0–77.0)
Platelets: 275 10*3/uL (ref 150.0–400.0)
RBC: 4.48 Mil/uL (ref 3.87–5.11)
RDW: 13.4 % (ref 11.5–15.5)
WBC: 5.8 10*3/uL (ref 4.0–10.5)

## 2021-11-28 LAB — LDL CHOLESTEROL, DIRECT: Direct LDL: 103 mg/dL

## 2021-11-28 LAB — LIPID PANEL
Cholesterol: 190 mg/dL (ref 0–200)
HDL: 59.5 mg/dL (ref >39.00)
NonHDL: 130.19
Total CHOL/HDL Ratio: 3
Triglycerides: 232 mg/dL — ABNORMAL HIGH (ref 0.0–149.0)
VLDL: 46.4 mg/dL — ABNORMAL HIGH (ref 0.0–40.0)

## 2021-11-28 LAB — HEPATIC FUNCTION PANEL
ALT: 16 U/L (ref 0–35)
AST: 18 U/L (ref 0–37)
Albumin: 4.4 g/dL (ref 3.5–5.2)
Alkaline Phosphatase: 63 U/L (ref 39–117)
Bilirubin, Direct: 0.1 mg/dL (ref 0.0–0.3)
Total Bilirubin: 0.4 mg/dL (ref 0.2–1.2)
Total Protein: 7.4 g/dL (ref 6.0–8.3)

## 2021-11-28 LAB — TSH: TSH: 1.17 u[IU]/mL (ref 0.35–5.50)

## 2021-11-28 LAB — VITAMIN D 25 HYDROXY (VIT D DEFICIENCY, FRACTURES): VITD: 92.21 ng/mL (ref 30.00–100.00)

## 2021-11-28 NOTE — Progress Notes (Signed)
? ?Subjective:  ? ? Patient ID: Kaylee Pratt, female    DOB: 20-Mar-1956, 66 y.o.   MRN: [id number] ? ?HPI ?Here today for Welcome to Medicare CPE. ? ?Risk Factors: ?Hyperlipidemia- chronic problem, on Simvastatin 20mg  daily ?Osteoporosis- chronic problem, on daily Vit D ?Physical Activity: exercising regularly 3x/week for >60 minutes ?Fall Risk: low risk- no recent falls ?Depression: denies current sxs on Lexapro 20mg  daily, pt is adjusting to retirement ?Hearing: normal to conversational tones and whispered voice at 6 ft ?ADL's: independent ?Cognitive: normal linear thought process, memory and attention intact ?Home Safety: safe at home, lives w/ husband ?Height, Weight, BMI, Visual Acuity: see vitals,  ?Counseling: UTD on mammo, colonoscopy.  Due for repeat DEXA.  UTD on Tdap, Shingrix, flu.  Due for PNA vaccine ?Drug/Alcohol screen- pt doesn't drink alcohol, does not use any illicit substances, uses Klonopin as directed for anxiety ?Health Care POA/Living Will- pt has both ?Labs Ordered: See A&P ?Care Plan: See A&P  ? ?Health Maintenance  ?Topic Date Due  ? COVID-19 Vaccine (4 - Booster for Moderna series) 09/02/2020  ? Pneumonia Vaccine 38+ Years old (1 - PCV) Never done  ? MAMMOGRAM  07/03/2022  ? PAP SMEAR-Modifier  05/09/2023  ? TETANUS/TDAP  05/08/2028  ? COLONOSCOPY (Pts 45-16yrs Insurance coverage will need to be confirmed)  07/13/2030  ? INFLUENZA VACCINE  Completed  ? DEXA SCAN  Completed  ? Hepatitis C Screening  Completed  ? HIV Screening  Completed  ? Zoster Vaccines- Shingrix  Completed  ? HPV VACCINES  Aged Out  ?  ? ? ?Review of Systems ?Patient reports no vision/ hearing changes, adenopathy,fever, weight change,  persistant/recurrent hoarseness , swallowing issues, chest pain, palpitations, edema, persistant/recurrent cough, hemoptysis, dyspnea (rest/exertional/paroxysmal nocturnal), gastrointestinal bleeding (melena, rectal bleeding), abdominal pain, significant heartburn, bowel changes, GU symptoms  (dysuria, hematuria, incontinence), Gyn symptoms (abnormal  bleeding, pain),  syncope, focal weakness, memory loss, numbness & tingling, skin/hair/nail changes, abnormal bruising or bleeding, anxiety, or depression.  ? ?L middle finger- trigger finger ? ?This visit occurred during the SARS-CoV-2 public health emergency.  Safety protocols were in place, including screening questions prior to the visit, additional usage of staff PPE, and extensive cleaning of exam room while observing appropriate contact time as indicated for disinfecting solutions.   ?   ?Objective:  ? Physical Exam ?General Appearance:    Alert, cooperative, no distress, appears stated age  ?Head:    Normocephalic, without obvious abnormality, atraumatic  ?Eyes:    PERRL, conjunctiva/corneas clear, EOM's intact, fundi  ?  benign, both eyes  ?Ears:    Normal TM's and external ear canals, both ears  ?Nose:   Deferred due to COVID  ?Throat:   ?Neck:   Supple, symmetrical, trachea midline, no adenopathy;  ?  Thyroid: no enlargement/tenderness/nodules  ?Back:     Symmetric, no curvature, ROM normal, no CVA tenderness  ?Lungs:     Clear to auscultation bilaterally, respirations unlabored  ?Chest Wall:    No tenderness or deformity  ? Heart:    Regular rate and rhythm, S1 and S2 normal, no murmur, rub ?  or gallop  ?Breast Exam:    Deferred to mammo  ?Abdomen:     Soft, non-tender, bowel sounds active all four quadrants,  ?  no masses, no organomegaly  ?Genitalia:    Deferred  ?Rectal:    ?Extremities:   Extremities normal, atraumatic, no cyanosis or edema, L middle finger trigger finger  ?Pulses:   2+  and symmetric all extremities  ?Skin:   Skin color, texture, turgor normal, no rashes or lesions  ?Lymph nodes:   Cervical, supraclavicular, and axillary nodes normal  ?Neurologic:   CNII-XII intact, normal strength, sensation and reflexes  ?  throughout  ?  ? ? ? ?   ?Assessment & Plan:  ? ? ?

## 2021-11-28 NOTE — Assessment & Plan Note (Signed)
Chronic problem.  Tolerating statin w/o difficulty.  Check labs.  Adjust meds prn  

## 2021-11-28 NOTE — Assessment & Plan Note (Signed)
Pt's PE WNL.  UTD on mammo, colonoscopy.  Due for repeat DEXA- ordered.  UTD on Tdap, Shingrix, flu.  Prevnar 20 given today.  Check labs.  Reviewed importance of healthy diet, regular exercise.  Pt encouraged to schedule formal eye exam. ?

## 2021-11-28 NOTE — Patient Instructions (Signed)
Follow up in 6 months to recheck cholesterol ?We'll notify you of your lab results and make any changes if needed ?We'll try and find an orthopedic and imaging center in [place] ?Keep up the good work on healthy diet and regular exercise- you look great! ?Call with any questions or concerns ?Stay Safe!  Stay Healthy! ?Happy Spring!!! ?

## 2021-11-28 NOTE — Assessment & Plan Note (Signed)
DEXA ordered.  Check Vit D and replete prn. 

## 2021-12-05 ENCOUNTER — Encounter: Payer: Self-pay | Admitting: Family Medicine

## 2021-12-05 DIAGNOSIS — J301 Allergic rhinitis due to pollen: Secondary | ICD-10-CM

## 2022-01-10 ENCOUNTER — Ambulatory Visit: Payer: BC Managed Care – PPO | Admitting: Family Medicine

## 2022-01-10 DIAGNOSIS — M79641 Pain in right hand: Secondary | ICD-10-CM | POA: Diagnosis not present

## 2022-01-10 DIAGNOSIS — M65332 Trigger finger, left middle finger: Secondary | ICD-10-CM | POA: Diagnosis not present

## 2022-01-10 DIAGNOSIS — M79642 Pain in left hand: Secondary | ICD-10-CM | POA: Diagnosis not present

## 2022-01-10 DIAGNOSIS — M65341 Trigger finger, right ring finger: Secondary | ICD-10-CM | POA: Diagnosis not present

## 2022-01-11 ENCOUNTER — Other Ambulatory Visit: Payer: Self-pay | Admitting: Family Medicine

## 2022-01-15 DIAGNOSIS — J4530 Mild persistent asthma, uncomplicated: Secondary | ICD-10-CM | POA: Diagnosis not present

## 2022-01-15 DIAGNOSIS — H1013 Acute atopic conjunctivitis, bilateral: Secondary | ICD-10-CM | POA: Diagnosis not present

## 2022-01-15 DIAGNOSIS — J301 Allergic rhinitis due to pollen: Secondary | ICD-10-CM | POA: Diagnosis not present

## 2022-03-22 ENCOUNTER — Other Ambulatory Visit: Payer: Self-pay | Admitting: Family Medicine

## 2022-04-01 ENCOUNTER — Other Ambulatory Visit: Payer: Self-pay | Admitting: Family Medicine

## 2022-05-18 ENCOUNTER — Other Ambulatory Visit: Payer: Self-pay | Admitting: Family Medicine

## 2022-05-18 NOTE — Telephone Encounter (Signed)
Patient is requesting a refill of the following medications: Requested Prescriptions   Pending Prescriptions Disp Refills   clonazePAM (KLONOPIN) 1 MG tablet [Pharmacy Med Name: CLONAZEPAM 1 MG TABLET] 90 tablet     Sig: TAKE 1 TABLET BY MOUTH TWICE A DAY AS NEEDED FOR ANXIETY    Date of patient request: 05/18/2022 Last office visit: 11/28/2021 Date of last refill: 10/02/2021 Last refill amount: 90 tablets 1 refill Follow up time period per chart:

## 2022-05-19 NOTE — Telephone Encounter (Signed)
Prescription request reviewed as primary care provider out of office.  Chart reviewed wellness exam in March.  Klonopin as needed for anxiety discussed at that time.  Controlled substance database reviewed, last filled clonazepam 90 on 02/07/2022.  Refill ordered.

## 2022-05-23 ENCOUNTER — Encounter: Payer: Self-pay | Admitting: Family Medicine

## 2022-05-28 ENCOUNTER — Telehealth: Payer: Self-pay

## 2022-05-28 NOTE — Telephone Encounter (Signed)
Benefits submitted via [employer]-pending insurance. 

## 2022-05-29 ENCOUNTER — Encounter: Payer: Self-pay | Admitting: Family Medicine

## 2022-05-29 DIAGNOSIS — M810 Age-related osteoporosis without current pathological fracture: Secondary | ICD-10-CM

## 2022-05-29 NOTE — Telephone Encounter (Signed)
Benefits received. Estimated cost will be $320. PA is pending

## 2022-06-01 NOTE — Telephone Encounter (Signed)
Informed pt and advised her that a PA is pending and est cost $320

## 2022-06-05 ENCOUNTER — Telehealth: Payer: Self-pay | Admitting: Family Medicine

## 2022-06-05 NOTE — Telephone Encounter (Signed)
PA approved-fax should be coming to the office, Auth# [id number]. Dates 05/29/22-05/30/23. Patient will need labs and nurse visit for injection

## 2022-06-05 NOTE — Telephone Encounter (Signed)
Caller name: Arron Mcnaught   On DPR? :yes/no: Yes  Call back number: [phone]  Provider they see:  Birdie Riddle   Reason for call: Pt called [facility] back. Pt stated that her mammogram referral was sent but her bone density referral wasn't. Please fax to [phone].

## 2022-06-06 ENCOUNTER — Other Ambulatory Visit: Payer: Self-pay

## 2022-06-06 DIAGNOSIS — M810 Age-related osteoporosis without current pathological fracture: Secondary | ICD-10-CM

## 2022-06-06 NOTE — Telephone Encounter (Signed)
Bone density order has been faxed over sent pt a my chart message as well

## 2022-06-06 NOTE — Telephone Encounter (Signed)
Left pt a VM asking her to schedule a lab only visit  for her BMP order is in

## 2022-06-06 NOTE — Telephone Encounter (Signed)
Will fax today

## 2022-06-06 NOTE — Telephone Encounter (Signed)
This will have to be taken care of in office as I don't have a way to fax orders.

## 2022-06-06 NOTE — Telephone Encounter (Signed)
Other than having a BMP ahead of time, I don't know of any other labs needed

## 2022-06-07 NOTE — Telephone Encounter (Signed)
LM again for pt to return call to schedule appt for labs

## 2022-06-08 ENCOUNTER — Encounter: Payer: Self-pay | Admitting: Family Medicine

## 2022-06-08 NOTE — Telephone Encounter (Signed)
Contact letter has been sent to pt.

## 2022-06-08 NOTE — Telephone Encounter (Signed)
LM again for pt may need to consider unable to contact letter

## 2022-06-19 NOTE — Telephone Encounter (Signed)
[facility] Scheduling called and states that they still have not received Bone Density Scan orders.  Please re-fax to [phone]. I did confirm that this is the correct fax number.

## 2022-06-19 NOTE — Telephone Encounter (Signed)
Faxed order to [phone]

## 2022-06-21 ENCOUNTER — Ambulatory Visit: Payer: BC Managed Care – PPO | Admitting: Family Medicine

## 2022-06-22 ENCOUNTER — Other Ambulatory Visit: Payer: Self-pay | Admitting: Family Medicine

## 2022-06-25 ENCOUNTER — Other Ambulatory Visit: Payer: Self-pay

## 2022-06-25 MED ORDER — ESCITALOPRAM OXALATE 20 MG PO TABS: 20.0000 mg | ORAL_TABLET | Freq: Every day | ORAL | 1 refills | Status: DC

## 2022-06-26 DIAGNOSIS — M810 Age-related osteoporosis without current pathological fracture: Secondary | ICD-10-CM | POA: Diagnosis not present

## 2022-06-26 LAB — HM DEXA SCAN

## 2022-06-28 ENCOUNTER — Encounter: Payer: Self-pay | Admitting: Family Medicine

## 2022-07-11 DIAGNOSIS — F331 Major depressive disorder, recurrent, moderate: Secondary | ICD-10-CM | POA: Diagnosis not present

## 2022-07-11 DIAGNOSIS — R69 Illness, unspecified: Secondary | ICD-10-CM | POA: Diagnosis not present

## 2022-07-12 ENCOUNTER — Ambulatory Visit: Payer: BC Managed Care – PPO | Admitting: Family Medicine

## 2022-07-16 ENCOUNTER — Encounter: Payer: BC Managed Care – PPO | Admitting: Family Medicine

## 2022-07-19 DIAGNOSIS — R69 Illness, unspecified: Secondary | ICD-10-CM | POA: Diagnosis not present

## 2022-07-19 DIAGNOSIS — F331 Major depressive disorder, recurrent, moderate: Secondary | ICD-10-CM | POA: Diagnosis not present

## 2022-07-23 DIAGNOSIS — M7661 Achilles tendinitis, right leg: Secondary | ICD-10-CM | POA: Diagnosis not present

## 2022-07-23 DIAGNOSIS — M79671 Pain in right foot: Secondary | ICD-10-CM | POA: Diagnosis not present

## 2022-07-25 DIAGNOSIS — R69 Illness, unspecified: Secondary | ICD-10-CM | POA: Diagnosis not present

## 2022-07-25 DIAGNOSIS — F331 Major depressive disorder, recurrent, moderate: Secondary | ICD-10-CM | POA: Diagnosis not present

## 2022-08-10 DIAGNOSIS — Z008 Encounter for other general examination: Secondary | ICD-10-CM | POA: Diagnosis not present

## 2022-08-10 DIAGNOSIS — Z9181 History of falling: Secondary | ICD-10-CM | POA: Diagnosis not present

## 2022-08-10 DIAGNOSIS — F419 Anxiety disorder, unspecified: Secondary | ICD-10-CM | POA: Diagnosis not present

## 2022-08-10 DIAGNOSIS — Z8249 Family history of ischemic heart disease and other diseases of the circulatory system: Secondary | ICD-10-CM | POA: Diagnosis not present

## 2022-08-10 DIAGNOSIS — R69 Illness, unspecified: Secondary | ICD-10-CM | POA: Diagnosis not present

## 2022-08-10 DIAGNOSIS — Z809 Family history of malignant neoplasm, unspecified: Secondary | ICD-10-CM | POA: Diagnosis not present

## 2022-08-10 DIAGNOSIS — K219 Gastro-esophageal reflux disease without esophagitis: Secondary | ICD-10-CM | POA: Diagnosis not present

## 2022-08-10 DIAGNOSIS — Z880 Allergy status to penicillin: Secondary | ICD-10-CM | POA: Diagnosis not present

## 2022-08-10 DIAGNOSIS — R030 Elevated blood-pressure reading, without diagnosis of hypertension: Secondary | ICD-10-CM | POA: Diagnosis not present

## 2022-08-10 DIAGNOSIS — Z96649 Presence of unspecified artificial hip joint: Secondary | ICD-10-CM | POA: Diagnosis not present

## 2022-08-10 DIAGNOSIS — E785 Hyperlipidemia, unspecified: Secondary | ICD-10-CM | POA: Diagnosis not present

## 2022-08-10 DIAGNOSIS — J45909 Unspecified asthma, uncomplicated: Secondary | ICD-10-CM | POA: Diagnosis not present

## 2022-08-14 ENCOUNTER — Ambulatory Visit (INDEPENDENT_AMBULATORY_CARE_PROVIDER_SITE_OTHER): Payer: Medicare HMO | Admitting: Family Medicine

## 2022-08-14 ENCOUNTER — Encounter: Payer: Self-pay | Admitting: Family Medicine

## 2022-08-14 VITALS — BP 116/76 | HR 71 | Temp 98.0°F | Resp 18 | Ht 60.0 in | Wt 135.2 lb

## 2022-08-14 DIAGNOSIS — M810 Age-related osteoporosis without current pathological fracture: Secondary | ICD-10-CM

## 2022-08-14 DIAGNOSIS — E785 Hyperlipidemia, unspecified: Secondary | ICD-10-CM

## 2022-08-14 DIAGNOSIS — E663 Overweight: Secondary | ICD-10-CM

## 2022-08-14 LAB — HEPATIC FUNCTION PANEL
ALT: 19 U/L (ref 0–35)
AST: 20 U/L (ref 0–37)
Albumin: 4.2 g/dL (ref 3.5–5.2)
Alkaline Phosphatase: 54 U/L (ref 39–117)
Bilirubin, Direct: 0.1 mg/dL (ref 0.0–0.3)
Total Bilirubin: 0.5 mg/dL (ref 0.2–1.2)
Total Protein: 7.3 g/dL (ref 6.0–8.3)

## 2022-08-14 LAB — LIPID PANEL
Cholesterol: 196 mg/dL (ref 0–200)
HDL: 54 mg/dL (ref >39.00)
NonHDL: 142.31
Total CHOL/HDL Ratio: 4
Triglycerides: 288 mg/dL — ABNORMAL HIGH (ref 0.0–149.0)
VLDL: 57.6 mg/dL — ABNORMAL HIGH (ref 0.0–40.0)

## 2022-08-14 LAB — CBC WITH DIFFERENTIAL/PLATELET
Basophils Absolute: 0 10*3/uL (ref 0.0–0.1)
Basophils Relative: 0.5 % (ref 0.0–3.0)
Eosinophils Absolute: 0.2 10*3/uL (ref 0.0–0.7)
Eosinophils Relative: 3.4 % (ref 0.0–5.0)
HCT: 39.4 % (ref 36.0–46.0)
Hemoglobin: 13.1 g/dL (ref 12.0–15.0)
Lymphocytes Relative: 29.9 % (ref 12.0–46.0)
Lymphs Abs: 1.5 10*3/uL (ref 0.7–4.0)
MCHC: 33.2 g/dL (ref 30.0–36.0)
MCV: 91 fl (ref 78.0–100.0)
Monocytes Absolute: 0.6 10*3/uL (ref 0.1–1.0)
Monocytes Relative: 11 % (ref 3.0–12.0)
Neutro Abs: 2.8 10*3/uL (ref 1.4–7.7)
Neutrophils Relative %: 55.2 % (ref 43.0–77.0)
Platelets: 271 10*3/uL (ref 150.0–400.0)
RBC: 4.34 Mil/uL (ref 3.87–5.11)
RDW: 13.7 % (ref 11.5–15.5)
WBC: 5.1 10*3/uL (ref 4.0–10.5)

## 2022-08-14 LAB — BASIC METABOLIC PANEL
BUN: 13 mg/dL (ref 6–23)
CO2: 30 mEq/L (ref 19–32)
Calcium: 9.1 mg/dL (ref 8.4–10.5)
Chloride: 106 mEq/L (ref 96–112)
Creatinine, Ser: 0.81 mg/dL (ref 0.40–1.20)
GFR: 75.93 mL/min (ref >60.00)
Glucose, Bld: 80 mg/dL (ref 70–99)
Potassium: 4.5 mEq/L (ref 3.5–5.1)
Sodium: 142 mEq/L (ref 135–145)

## 2022-08-14 LAB — TSH: TSH: 0.67 u[IU]/mL (ref 0.35–5.50)

## 2022-08-14 LAB — LDL CHOLESTEROL, DIRECT: Direct LDL: 107 mg/dL

## 2022-08-14 LAB — VITAMIN D 25 HYDROXY (VIT D DEFICIENCY, FRACTURES): VITD: 68.59 ng/mL (ref 30.00–100.00)

## 2022-08-14 MED ORDER — DENOSUMAB 60 MG/ML ~~LOC~~ SOSY
60.0000 mg | PREFILLED_SYRINGE | Freq: Once | SUBCUTANEOUS | Status: AC
  Administered 2022-08-14: 60 mg via SUBCUTANEOUS

## 2022-08-14 MED ORDER — MONTELUKAST SODIUM 10 MG PO TABS: 10.0000 mg | ORAL_TABLET | Freq: Every day | ORAL | 3 refills | Status: AC

## 2022-08-14 NOTE — Addendum Note (Signed)
Addended byJaci Lazier, Sibel Khurana on: 08/14/2022 10:52 AM   Modules accepted: Orders

## 2022-08-14 NOTE — Assessment & Plan Note (Signed)
Pt has gained 9 lbs since last visit.  Encouraged healthy diet and regular exercise.  Will continue to follow.

## 2022-08-14 NOTE — Assessment & Plan Note (Signed)
Chronic problem.  Doing well on Prolia- given today.  Encouraged daily Ca and Vit D.

## 2022-08-14 NOTE — Patient Instructions (Addendum)
Schedule your complete physical in 6 months We'll notify you of your lab results and make any changes if needed Keep up the god work on healthy diet and regular exercise- you can do it! Start a daily multivitamin- [employer] [facility] Call with any questions or concerns Stay Safe!  Stay Healthy! Happy Holidays!!!

## 2022-08-14 NOTE — Progress Notes (Signed)
   Subjective:    Patient ID: Kaylee Pratt, female    DOB: October 16, 1955, 66 y.o.   MRN: [id number]  HPI Hyperlipidemia- chronic problem, on Simvastatin 20mg  nightly.  No CP, SOB, abd pain, N/V.  Overweight- pt has gained 9 lbs since last visit.  Pt has an active lifestyle but no formal exercise.  Was previously walking daily.  Osteoporosis- pt is due for repeat Prolia.  Currently taking Vit D but not calcium.     Review of Systems For ROS see HPI     Objective:   Physical Exam Vitals reviewed.  Constitutional:      General: She is not in acute distress.    Appearance: Normal appearance. She is well-developed. She is not ill-appearing.  HENT:     Head: Normocephalic and atraumatic.  Eyes:     Conjunctiva/sclera: Conjunctivae normal.     Pupils: Pupils are equal, round, and reactive to light.  Neck:     Thyroid: No thyromegaly.  Cardiovascular:     Rate and Rhythm: Normal rate and regular rhythm.     Pulses: Normal pulses.     Heart sounds: Normal heart sounds. No murmur heard. Pulmonary:     Effort: Pulmonary effort is normal. No respiratory distress.     Breath sounds: Normal breath sounds.  Abdominal:     General: There is no distension.     Palpations: Abdomen is soft.     Tenderness: There is no abdominal tenderness.  Musculoskeletal:     Cervical back: Normal range of motion and neck supple.     Right lower leg: No edema.     Left lower leg: No edema.  Lymphadenopathy:     Cervical: No cervical adenopathy.  Skin:    General: Skin is warm and dry.  Neurological:     General: No focal deficit present.     Mental Status: She is alert and oriented to person, place, and time.  Psychiatric:        Behavior: Behavior normal.           Assessment & Plan:

## 2022-08-14 NOTE — Assessment & Plan Note (Signed)
Chronic problem.  On Simvastatin 20mg nightly w/o difficulty.  Check labs.  Adjust meds prn  

## 2022-08-15 ENCOUNTER — Telehealth: Payer: Self-pay

## 2022-08-15 NOTE — Telephone Encounter (Signed)
-----   Message from Sheliah Hatch, MD sent at 08/15/2022  7:26 AM EST ----- Labs look great w/ exception of elevated Triglycerides (fatty part of blood).  This will improve w/ healthy diet and regular exercise.  No med changes at this time.

## 2022-08-15 NOTE — Telephone Encounter (Signed)
Informed pt of lab results  

## 2022-08-21 ENCOUNTER — Other Ambulatory Visit: Payer: Self-pay | Admitting: Family Medicine

## 2022-08-22 NOTE — Telephone Encounter (Signed)
Informed pt that Rx has been sent in  

## 2022-08-22 NOTE — Telephone Encounter (Signed)
Clonazepam 1 mg LOV: 08/14/22 Last Refill:05/19/22 Upcoming appt: 01/15/23

## 2022-08-24 DIAGNOSIS — Z1231 Encounter for screening mammogram for malignant neoplasm of breast: Secondary | ICD-10-CM | POA: Diagnosis not present

## 2022-08-24 LAB — HM MAMMOGRAPHY

## 2022-08-27 ENCOUNTER — Encounter: Payer: Self-pay | Admitting: Family Medicine

## 2022-09-09 ENCOUNTER — Other Ambulatory Visit: Payer: Self-pay | Admitting: Family Medicine

## 2022-09-23 ENCOUNTER — Other Ambulatory Visit: Payer: Self-pay | Admitting: Family Medicine

## 2022-09-26 DIAGNOSIS — R69 Illness, unspecified: Secondary | ICD-10-CM | POA: Diagnosis not present

## 2022-09-26 DIAGNOSIS — F331 Major depressive disorder, recurrent, moderate: Secondary | ICD-10-CM | POA: Diagnosis not present

## 2022-10-03 DIAGNOSIS — R69 Illness, unspecified: Secondary | ICD-10-CM | POA: Diagnosis not present

## 2022-10-03 DIAGNOSIS — F331 Major depressive disorder, recurrent, moderate: Secondary | ICD-10-CM | POA: Diagnosis not present

## 2022-10-12 DIAGNOSIS — F331 Major depressive disorder, recurrent, moderate: Secondary | ICD-10-CM | POA: Diagnosis not present

## 2022-10-12 DIAGNOSIS — R69 Illness, unspecified: Secondary | ICD-10-CM | POA: Diagnosis not present

## 2022-11-15 ENCOUNTER — Other Ambulatory Visit: Payer: Self-pay | Admitting: Family Medicine

## 2022-11-29 ENCOUNTER — Telehealth: Payer: Self-pay | Admitting: Family Medicine

## 2022-11-29 NOTE — Telephone Encounter (Signed)
Contacted Kaylee Pratt to schedule their annual wellness visit. Patient declined to schedule AWV at this time.  Patient's insurance did AWV December, 2023.  Thank you,  [place] Direct dial  [phone]

## 2022-12-07 ENCOUNTER — Other Ambulatory Visit: Payer: Self-pay | Admitting: Family Medicine

## 2022-12-17 DIAGNOSIS — J4530 Mild persistent asthma, uncomplicated: Secondary | ICD-10-CM | POA: Diagnosis not present

## 2022-12-17 DIAGNOSIS — J3089 Other allergic rhinitis: Secondary | ICD-10-CM | POA: Diagnosis not present

## 2022-12-17 DIAGNOSIS — H1013 Acute atopic conjunctivitis, bilateral: Secondary | ICD-10-CM | POA: Diagnosis not present

## 2022-12-17 DIAGNOSIS — J301 Allergic rhinitis due to pollen: Secondary | ICD-10-CM | POA: Diagnosis not present

## 2022-12-19 ENCOUNTER — Other Ambulatory Visit: Payer: Self-pay | Admitting: Family Medicine

## 2023-01-15 ENCOUNTER — Encounter: Payer: Medicare HMO | Admitting: Family Medicine

## 2023-02-07 ENCOUNTER — Encounter: Payer: Self-pay | Admitting: Family Medicine

## 2023-02-07 ENCOUNTER — Telehealth: Payer: Self-pay

## 2023-02-07 ENCOUNTER — Other Ambulatory Visit (HOSPITAL_COMMUNITY): Payer: Self-pay

## 2023-02-07 ENCOUNTER — Ambulatory Visit (INDEPENDENT_AMBULATORY_CARE_PROVIDER_SITE_OTHER): Payer: Medicare HMO | Admitting: Family Medicine

## 2023-02-07 VITALS — BP 126/78 | HR 74 | Temp 98.9°F | Resp 18 | Ht 60.0 in | Wt 139.4 lb

## 2023-02-07 DIAGNOSIS — Z0000 Encounter for general adult medical examination without abnormal findings: Secondary | ICD-10-CM

## 2023-02-07 DIAGNOSIS — M810 Age-related osteoporosis without current pathological fracture: Secondary | ICD-10-CM

## 2023-02-07 DIAGNOSIS — E785 Hyperlipidemia, unspecified: Secondary | ICD-10-CM | POA: Diagnosis not present

## 2023-02-07 NOTE — Assessment & Plan Note (Signed)
Chronic problem.  Check labs.  Adjust meds prn  

## 2023-02-07 NOTE — Assessment & Plan Note (Signed)
Pt's PE WNL.  UTD on mammo, colonoscopy, PNA, Tdap.  Check labs.  Anticipatory guidance provided.

## 2023-02-07 NOTE — Assessment & Plan Note (Signed)
Check Vit D and replete prn.  Get prior authorization for her next Prolia

## 2023-02-07 NOTE — Telephone Encounter (Signed)
Created new encounter for Prolia BIV. Will route encounter back once benefit verification is complete.  

## 2023-02-07 NOTE — Patient Instructions (Addendum)
Follow up in 1 year or as needed Schedule a nurse visit for Prolia after 6/5 We'll notify you of your lab results and make any changes if needed Keep up the good work on healthy diet and regular exercise- you're doing great! Call with any questions or concerns Stay Safe!  Stay Healthy! Have a great summer!!!

## 2023-02-07 NOTE — Progress Notes (Signed)
   Subjective:    Patient ID: Kaylee Pratt, female    DOB: 12-Jun-1956, 67 y.o.   MRN: [id number]  HPI CPE- UTD on mammo, colonoscopy, PNA, Tdap  Patient Care Team    Relationship Specialty Notifications Start End  Sheliah Hatch, MD PCP - General Family Medicine  04/09/13      Health Maintenance  Topic Date Due   Medicare Annual Wellness (AWV)  11/29/2022   INFLUENZA VACCINE  04/11/2023   MAMMOGRAM  08/25/2023   DTaP/Tdap/Td (3 - Td or Tdap) 05/08/2028   Colonoscopy  07/13/2030   Pneumonia Vaccine 55+ Years old  Completed   DEXA SCAN  Completed   Hepatitis C Screening  Completed   Zoster Vaccines- Shingrix  Completed   HPV VACCINES  Aged Out   COVID-19 Vaccine  Discontinued      Review of Systems Patient reports no vision/ hearing changes, adenopathy,fever, weight change,  persistant/recurrent hoarseness , swallowing issues, chest pain, palpitations, edema, persistant/recurrent cough, hemoptysis, dyspnea (rest/exertional/paroxysmal nocturnal), gastrointestinal bleeding (melena, rectal bleeding), abdominal pain, significant heartburn, bowel changes, GU symptoms (dysuria, hematuria, incontinence), Gyn symptoms (abnormal  bleeding, pain),  syncope, focal weakness, memory loss, numbness & tingling, skin/hair/nail changes, abnormal bruising or bleeding, anxiety, or depression.     Objective:   Physical Exam General Appearance:    Alert, cooperative, no distress, appears stated age  Head:    Normocephalic, without obvious abnormality, atraumatic  Eyes:    PERRL, conjunctiva/corneas clear, EOM's intact both eyes  Ears:    Normal TM's and external ear canals, both ears  Nose:   Nares normal, septum midline, mucosa normal, no drainage    or sinus tenderness  Throat:   Lips, mucosa, and tongue normal; teeth and gums normal  Neck:   Supple, symmetrical, trachea midline, no adenopathy;    Thyroid: no enlargement/tenderness/nodules  Back:     Symmetric, no curvature, ROM normal, no CVA  tenderness  Lungs:     Clear to auscultation bilaterally, respirations unlabored  Chest Wall:    No tenderness or deformity   Heart:    Regular rate and rhythm, S1 and S2 normal, no murmur, rub   or gallop  Breast Exam:    Deferred to mammo  Abdomen:     Soft, non-tender, bowel sounds active all four quadrants,    no masses, no organomegaly  Genitalia:    Deferred   Rectal:    Extremities:   Extremities normal, atraumatic, no cyanosis or edema  Pulses:   2+ and symmetric all extremities  Skin:   Skin color, texture, turgor normal, no rashes or lesions  Lymph nodes:   Cervical, supraclavicular, and axillary nodes normal  Neurologic:   CNII-XII intact, normal strength, sensation and reflexes    throughout          Assessment & Plan:

## 2023-02-07 NOTE — Telephone Encounter (Signed)
Prolia VOB initiated via [email or web address]  Last Prolia inj: 08/14/22 Next Prolia inj DUE: 02/11/23

## 2023-02-07 NOTE — Telephone Encounter (Addendum)
Auth# [id number]

## 2023-02-07 NOTE — Telephone Encounter (Signed)
Good afternoon , Could you check and see if my patient needs a PA for her Prolia? Last injection was 08/14/22 and she is coming due .  Dx Osteoporosis

## 2023-02-08 ENCOUNTER — Other Ambulatory Visit (HOSPITAL_COMMUNITY): Payer: Self-pay

## 2023-02-08 LAB — HEPATIC FUNCTION PANEL
ALT: 17 U/L (ref 0–35)
AST: 21 U/L (ref 0–37)
Albumin: 4.2 g/dL (ref 3.5–5.2)
Alkaline Phosphatase: 49 U/L (ref 39–117)
Bilirubin, Direct: 0.1 mg/dL (ref 0.0–0.3)
Total Bilirubin: 0.5 mg/dL (ref 0.2–1.2)
Total Protein: 7.5 g/dL (ref 6.0–8.3)

## 2023-02-08 LAB — CBC WITH DIFFERENTIAL/PLATELET
Basophils Absolute: 0.1 10*3/uL (ref 0.0–0.1)
Basophils Relative: 1 % (ref 0.0–3.0)
Eosinophils Absolute: 0.2 10*3/uL (ref 0.0–0.7)
Eosinophils Relative: 3.9 % (ref 0.0–5.0)
HCT: 40.4 % (ref 36.0–46.0)
Hemoglobin: 13.1 g/dL (ref 12.0–15.0)
Lymphocytes Relative: 40.1 % (ref 12.0–46.0)
Lymphs Abs: 2.2 10*3/uL (ref 0.7–4.0)
MCHC: 32.5 g/dL (ref 30.0–36.0)
MCV: 89.8 fl (ref 78.0–100.0)
Monocytes Absolute: 0.5 10*3/uL (ref 0.1–1.0)
Monocytes Relative: 8.3 % (ref 3.0–12.0)
Neutro Abs: 2.6 10*3/uL (ref 1.4–7.7)
Neutrophils Relative %: 46.7 % (ref 43.0–77.0)
Platelets: 275 10*3/uL (ref 150.0–400.0)
RBC: 4.5 Mil/uL (ref 3.87–5.11)
RDW: 13.8 % (ref 11.5–15.5)
WBC: 5.6 10*3/uL (ref 4.0–10.5)

## 2023-02-08 LAB — BASIC METABOLIC PANEL
BUN: 12 mg/dL (ref 6–23)
CO2: 28 mEq/L (ref 19–32)
Calcium: 9 mg/dL (ref 8.4–10.5)
Chloride: 105 mEq/L (ref 96–112)
Creatinine, Ser: 0.76 mg/dL (ref 0.40–1.20)
GFR: 81.68 mL/min (ref >60.00)
Glucose, Bld: 74 mg/dL (ref 70–99)
Potassium: 3.8 mEq/L (ref 3.5–5.1)
Sodium: 140 mEq/L (ref 135–145)

## 2023-02-08 LAB — LIPID PANEL
Cholesterol: 187 mg/dL (ref 0–200)
HDL: 53.1 mg/dL (ref >39.00)
NonHDL: 134.27
Total CHOL/HDL Ratio: 4
Triglycerides: 274 mg/dL — ABNORMAL HIGH (ref 0.0–149.0)
VLDL: 54.8 mg/dL — ABNORMAL HIGH (ref 0.0–40.0)

## 2023-02-08 LAB — LDL CHOLESTEROL, DIRECT: Direct LDL: 101 mg/dL

## 2023-02-08 LAB — VITAMIN D 25 HYDROXY (VIT D DEFICIENCY, FRACTURES): VITD: 72.04 ng/mL (ref 30.00–100.00)

## 2023-02-08 LAB — TSH: TSH: 0.89 u[IU]/mL (ref 0.35–5.50)

## 2023-02-08 NOTE — Telephone Encounter (Signed)
Left vm for pt to return my call.  

## 2023-02-08 NOTE — Telephone Encounter (Signed)
Pt ready for scheduling for PROLIA on or after : 02/11/23  Out-of-pocket cost due at time of visit: $327  Primary: [employer] Prolia co-insurance: 20% Admin fee co-insurance: 20%  Secondary: --- Prolia co-insurance:  Admin fee co-insurance:   Medical Benefit Details: Date Benefits were checked: 02/07/23 Deductible: NO/ Coinsurance: 20%/ Admin Fee: 20%  Prior Auth: APPROVED PA# [id number]  Expiration Date: 05/29/22-05/30/23   Pharmacy benefit: Copay $250 If patient wants fill through the pharmacy benefit please send prescription to: [employer], and include estimated need by date in rx notes. Pharmacy will ship medication directly to the office.  Patient not eligible for Prolia Copay Card. Copay Card can make patient's cost as little as $25. Link to apply: [email or web address]://www.[email or web address].[email or web address]/copay  ** This summary of benefits is an estimation of the patient's out-of-pocket cost. Exact cost may very based on individual plan coverage.

## 2023-02-08 NOTE — Telephone Encounter (Signed)
Note already open

## 2023-02-11 ENCOUNTER — Telehealth: Payer: Self-pay

## 2023-02-11 NOTE — Telephone Encounter (Signed)
Left lab results on pt VM 

## 2023-02-11 NOTE — Telephone Encounter (Signed)
-----   Message from Sheliah Hatch, MD sent at 02/11/2023  7:36 AM EDT ----- Labs look great!  No changes at this time

## 2023-02-13 NOTE — Telephone Encounter (Signed)
Pt will call to schedule her nurse visit for her Prolia and her medicine is in fridge on my side . Pt owes $327 at the time of visit . Pt is aware

## 2023-03-08 ENCOUNTER — Other Ambulatory Visit: Payer: Self-pay | Admitting: Family Medicine

## 2023-03-20 ENCOUNTER — Other Ambulatory Visit: Payer: Self-pay | Admitting: Family Medicine

## 2023-03-23 DIAGNOSIS — R197 Diarrhea, unspecified: Secondary | ICD-10-CM | POA: Diagnosis not present

## 2023-04-21 DIAGNOSIS — W1809XA Striking against other object with subsequent fall, initial encounter: Secondary | ICD-10-CM | POA: Diagnosis not present

## 2023-04-21 DIAGNOSIS — S0990XA Unspecified injury of head, initial encounter: Secondary | ICD-10-CM | POA: Diagnosis not present

## 2023-04-21 DIAGNOSIS — S0101XA Laceration without foreign body of scalp, initial encounter: Secondary | ICD-10-CM | POA: Diagnosis not present

## 2023-04-23 ENCOUNTER — Other Ambulatory Visit: Payer: Self-pay | Admitting: Family Medicine

## 2023-04-23 NOTE — Telephone Encounter (Signed)
Patient is requesting a refill of the following medications: Requested Prescriptions   Pending Prescriptions Disp Refills   clonazePAM (KLONOPIN) 1 MG tablet [Pharmacy Med Name: CLONAZEPAM 1 MG TABLET] 90 tablet 0    Sig: TAKE 1 TABLET BY MOUTH TWICE A DAY AS NEEDED FOR ANXIETY    Date of patient request: 04/23/23 Last office visit: 02/07/23 Date of last refill: 11/16/22 Last refill amount: 90 Follow up time period per chart: 3 month

## 2023-04-29 DIAGNOSIS — U071 COVID-19: Secondary | ICD-10-CM | POA: Diagnosis not present

## 2023-04-29 DIAGNOSIS — Z4802 Encounter for removal of sutures: Secondary | ICD-10-CM | POA: Diagnosis not present

## 2023-04-30 ENCOUNTER — Encounter: Payer: Self-pay | Admitting: Family Medicine

## 2023-05-15 ENCOUNTER — Ambulatory Visit: Payer: Medicare HMO | Admitting: Family Medicine

## 2023-05-15 ENCOUNTER — Encounter: Payer: Self-pay | Admitting: Family Medicine

## 2023-05-15 VITALS — BP 128/80 | HR 75 | Temp 97.9°F | Resp 18 | Ht 60.0 in | Wt 136.1 lb

## 2023-05-15 DIAGNOSIS — E663 Overweight: Secondary | ICD-10-CM | POA: Diagnosis not present

## 2023-05-15 DIAGNOSIS — M810 Age-related osteoporosis without current pathological fracture: Secondary | ICD-10-CM | POA: Diagnosis not present

## 2023-05-15 DIAGNOSIS — Z23 Encounter for immunization: Secondary | ICD-10-CM | POA: Diagnosis not present

## 2023-05-15 DIAGNOSIS — W19XXXA Unspecified fall, initial encounter: Secondary | ICD-10-CM | POA: Diagnosis not present

## 2023-05-15 MED ORDER — DENOSUMAB 60 MG/ML ~~LOC~~ SOSY
60.0000 mg | PREFILLED_SYRINGE | Freq: Once | SUBCUTANEOUS | Status: AC
Start: 2023-05-15 — End: 2023-05-15
  Administered 2023-05-15: 60 mg via SUBCUTANEOUS

## 2023-05-15 NOTE — Patient Instructions (Signed)
Schedule your complete physical for June Keep up the good work on [employer] and regular exercise- you look great! Call with any questions or concerns Stay Safe!  Stay Healthy! HAPPY FALL!!!

## 2023-05-15 NOTE — Progress Notes (Signed)
   Subjective:    Patient ID: BROOKELLE CULLUM, female    DOB: [birth year], 67 y.o.   MRN: [id number]  HPI Osteoporosis- pt is here for Prolia today.  Fall- pt fell and hit her head on a table ~1 month ago and required 7 staples.  No LOC.  Pt is UTD on Tdap.  Did not have imaging done.  No longer having pain.  Staples were removed.  Denies dizziness, nausea, Emojean.   Review of Systems For ROS see HPI     Objective:   Physical Exam Vitals reviewed.  Constitutional:      General: She is not in acute distress.    Appearance: Normal appearance. She is not ill-appearing.  HENT:     Head: Normocephalic and atraumatic.  Skin:    General: Skin is warm and dry.     Findings: No lesion (no visible headwound).  Neurological:     General: No focal deficit present.     Mental Status: She is alert and oriented to person, place, and time.     Cranial Nerves: No cranial nerve deficit.     Motor: No weakness.           Assessment & Plan:   Fall from standing- new.  Got tripped up by a [place] dane at her neighbors house.  Hit her head on a table when she fell.  Had 7 staples but thankfully area is healed completely.  No Takeia, no dizziness.  No further workup needed

## 2023-05-19 NOTE — Assessment & Plan Note (Signed)
Prolia injxn given today.  Tolerated w/o difficulty

## 2023-06-02 ENCOUNTER — Other Ambulatory Visit: Payer: Self-pay | Admitting: Family Medicine

## 2023-06-04 ENCOUNTER — Other Ambulatory Visit: Payer: Self-pay | Admitting: Family Medicine

## 2023-06-16 ENCOUNTER — Other Ambulatory Visit: Payer: Self-pay | Admitting: Family Medicine

## 2023-08-19 ENCOUNTER — Encounter: Payer: Self-pay | Admitting: Family Medicine

## 2023-08-30 ENCOUNTER — Other Ambulatory Visit: Payer: Self-pay | Admitting: Family Medicine

## 2023-09-03 DIAGNOSIS — Z1231 Encounter for screening mammogram for malignant neoplasm of breast: Secondary | ICD-10-CM | POA: Diagnosis not present

## 2023-09-03 LAB — HM MAMMOGRAPHY

## 2023-09-05 ENCOUNTER — Encounter: Payer: Self-pay | Admitting: Family Medicine

## 2023-09-14 ENCOUNTER — Other Ambulatory Visit: Payer: Self-pay | Admitting: Family Medicine

## 2023-09-18 ENCOUNTER — Telehealth: Payer: Self-pay | Admitting: Family Medicine

## 2023-09-18 NOTE — Telephone Encounter (Signed)
 Type of form received:[employer]   Additional comments:   Received [id number] - Front Desk   Form should be Faxed/mailed to: N/A  Is patient requesting call for pickup: N/A  Form placed: [employer] charge sheet.  Provider will determine charge. N/A  Individual made aware of 3-5 business day turn around No?

## 2023-09-18 NOTE — Telephone Encounter (Signed)
 Placed in folder at nurse station

## 2023-09-24 NOTE — Telephone Encounter (Signed)
 Reviewed. No action needed.

## 2023-10-15 ENCOUNTER — Other Ambulatory Visit: Payer: Self-pay | Admitting: Family Medicine

## 2023-10-15 NOTE — Telephone Encounter (Signed)
 Requested Prescriptions   Pending Prescriptions Disp Refills   clonazePAM  (KLONOPIN ) 1 MG tablet [Pharmacy Med Name: CLONAZEPAM  1 MG TABLET] 90 tablet 0    Sig: TAKE 1 TABLET BY MOUTH TWICE A DAY AS NEEDED FOR ANXIETY     Date of patient request: 10/15/2023 Last office visit: 05/15/2023, follow up in 9 months for CPE Upcoming visit: Visit date not found Date of last refill: 10/07/204 Last refill amount: #90 0 refills

## 2023-10-23 ENCOUNTER — Telehealth: Payer: Self-pay

## 2023-10-23 NOTE — Telephone Encounter (Signed)
[name] Kitchen

## 2023-11-04 NOTE — Telephone Encounter (Signed)
 Pharmacy Patient Advocate Encounter   Received notification from  [facility] Portal that prior authorization for PROLIA is required/requested.   Insurance verification completed.   The patient is insured through [employer] .   Per test claim: PA required; PA submitted to above mentioned insurance via [employer]/confirmation #/EOC [id number] Status is pending

## 2023-11-06 ENCOUNTER — Other Ambulatory Visit (HOSPITAL_COMMUNITY): Payer: Self-pay

## 2023-11-06 NOTE — Telephone Encounter (Signed)
 Pharmacy Patient Advocate Encounter  Received notification from [employer] that Prior Authorization for PROLIA has been APPROVED from 11/04/23 to 11/03/24. Ran test claim, Copay is [id number]. This test claim was processed through [facility]- copay amounts may vary at other pharmacies due to pharmacy/plan contracts, or as the patient moves through the different stages of their insurance plan.   PA #/Case ID/Reference #: [id number]

## 2023-11-07 ENCOUNTER — Other Ambulatory Visit (HOSPITAL_COMMUNITY): Payer: Self-pay

## 2023-11-07 NOTE — Telephone Encounter (Signed)
 Pt ready for scheduling for PROLIA on or after : 11/12/23  Option# 1: Buy/Bill (Office supplied medication)  Out-of-pocket cost due at time of clinic visit: $367  Number of injection/visits approved: 2  Primary: [employer] Prolia co-insurance: 20% Admin fee co-insurance: $35  Secondary: --- Prolia co-insurance:  Admin fee co-insurance:   Medical Benefit Details: Date Benefits were checked: 10/16/23 Deductible: NO/ Coinsurance: 20%/ Admin Fee: $35  Prior Auth: APPROVED PA# [id number] Expiration Date: 11/04/23-11/03/24  # of doses approved: 2 ----------------------------------------------------------------------- Option# 2- Med Obtained from pharmacy:  Pharmacy benefit: Copay [phone] (Paid to pharmacy) Admin Fee: $35 (Pay at clinic)  Prior Auth: N/A PA# Expiration Date:   # of doses approved:   If patient wants fill through the pharmacy benefit please send prescription to: [facility], and include estimated need by date in rx notes. Pharmacy will ship medication directly to the office.  Patient NOT eligible for Prolia Copay Card. Copay Card can make patient's cost as little as $25. Link to apply: [email or web address]  ** This summary of benefits is an estimation of the patient's out-of-pocket cost. Exact cost may very based on individual plan coverage.

## 2023-11-14 ENCOUNTER — Other Ambulatory Visit: Payer: Self-pay | Admitting: Family Medicine

## 2023-12-03 ENCOUNTER — Other Ambulatory Visit: Payer: Self-pay | Admitting: Family Medicine

## 2023-12-20 DIAGNOSIS — R030 Elevated blood-pressure reading, without diagnosis of hypertension: Secondary | ICD-10-CM | POA: Diagnosis not present

## 2023-12-20 DIAGNOSIS — J45909 Unspecified asthma, uncomplicated: Secondary | ICD-10-CM | POA: Diagnosis not present

## 2023-12-20 DIAGNOSIS — M810 Age-related osteoporosis without current pathological fracture: Secondary | ICD-10-CM | POA: Diagnosis not present

## 2023-12-20 DIAGNOSIS — E7801 Familial hypercholesterolemia: Secondary | ICD-10-CM | POA: Diagnosis not present

## 2023-12-20 DIAGNOSIS — E78019 Familial hypercholesterolemia, unspecified: Secondary | ICD-10-CM | POA: Insufficient documentation

## 2023-12-20 DIAGNOSIS — F5104 Psychophysiologic insomnia: Secondary | ICD-10-CM | POA: Diagnosis not present

## 2023-12-20 DIAGNOSIS — F411 Generalized anxiety disorder: Secondary | ICD-10-CM | POA: Diagnosis not present

## 2024-01-09 ENCOUNTER — Other Ambulatory Visit: Payer: Self-pay | Admitting: Family Medicine

## 2024-01-09 NOTE — Telephone Encounter (Signed)
 Requested Prescriptions   Pending Prescriptions Disp Refills   clonazePAM  (KLONOPIN ) 1 MG tablet [Pharmacy Med Name: CLONAZEPAM  1 MG TABLET] 90 tablet 0    Sig: TAKE 1 TABLET BY MOUTH TWICE A DAY AS NEEDED FOR ANXIETY     Date of patient request: 01/09/2024 Last office visit: 05/15/2023 Upcoming visit: 02/13/2024  Date of last refill: 10/15/2023  Last refill amount: 90, 0 refills  I looked at her upcoming appt and noticed she was scheduled incorrectly for an AWV. I called her to fix this and have scheduled her for 02/13/24 at 12:40. I'm sorry this gave you three that afternoon but I didn't think it was fair to push her out so far when it wasn't her fault she was scheduled incorrectly. I did report the error.

## 2024-02-11 ENCOUNTER — Ambulatory Visit: Payer: Medicare HMO

## 2024-02-13 ENCOUNTER — Telehealth: Payer: Self-pay | Admitting: Family Medicine

## 2024-02-13 ENCOUNTER — Ambulatory Visit (INDEPENDENT_AMBULATORY_CARE_PROVIDER_SITE_OTHER): Admitting: Family Medicine

## 2024-02-13 ENCOUNTER — Encounter: Payer: Self-pay | Admitting: Family Medicine

## 2024-02-13 VITALS — BP 132/72 | HR 68 | Temp 97.8°F | Ht 60.0 in | Wt 134.2 lb

## 2024-02-13 DIAGNOSIS — M810 Age-related osteoporosis without current pathological fracture: Secondary | ICD-10-CM

## 2024-02-13 DIAGNOSIS — E785 Hyperlipidemia, unspecified: Secondary | ICD-10-CM

## 2024-02-13 DIAGNOSIS — Z0000 Encounter for general adult medical examination without abnormal findings: Secondary | ICD-10-CM

## 2024-02-13 LAB — CBC WITH DIFFERENTIAL/PLATELET
Basophils Absolute: 0 10*3/uL (ref 0.0–0.1)
Basophils Relative: 0.5 % (ref 0.0–3.0)
Eosinophils Absolute: 0.2 10*3/uL (ref 0.0–0.7)
Eosinophils Relative: 4.2 % (ref 0.0–5.0)
HCT: 40 % (ref 36.0–46.0)
Hemoglobin: 13.2 g/dL (ref 12.0–15.0)
Lymphocytes Relative: 44.1 % (ref 12.0–46.0)
Lymphs Abs: 2.3 10*3/uL (ref 0.7–4.0)
MCHC: 33.1 g/dL (ref 30.0–36.0)
MCV: 88.4 fl (ref 78.0–100.0)
Monocytes Absolute: 0.5 10*3/uL (ref 0.1–1.0)
Monocytes Relative: 9.7 % (ref 3.0–12.0)
Neutro Abs: 2.2 10*3/uL (ref 1.4–7.7)
Neutrophils Relative %: 41.5 % — ABNORMAL LOW (ref 43.0–77.0)
Platelets: 285 10*3/uL (ref 150.0–400.0)
RBC: 4.52 Mil/uL (ref 3.87–5.11)
RDW: 13.7 % (ref 11.5–15.5)
WBC: 5.2 10*3/uL (ref 4.0–10.5)

## 2024-02-13 LAB — TSH: TSH: 1.32 u[IU]/mL (ref 0.35–5.50)

## 2024-02-13 NOTE — Patient Instructions (Signed)
 Follow up in 1 year or as needed We'll notify you of your lab results and make any changes if needed Keep up the good work on healthy diet and regular exercise- you're doing great! Call with any questions or concerns Stay Safe!  Stay healthy! Have a great summer!!

## 2024-02-13 NOTE — Telephone Encounter (Signed)
 Placed in folder at nurse station

## 2024-02-13 NOTE — Progress Notes (Signed)
   Subjective:    Patient ID: Kaylee Pratt, female    DOB: 08-Jan-1956, 68 y.o.   MRN: [id number]  HPI CPE- UTD on mammo, colonoscopy, Tdap, PNA  Patient Care Team    Relationship Specialty Notifications Start End  Jess Morita, MD PCP - General Family Medicine  04/09/13     Health Maintenance  Topic Date Due   Medicare Annual Wellness (AWV)  11/29/2022   COVID-19 Vaccine (5 - 2024-25 season) 05/12/2023   INFLUENZA VACCINE  04/10/2024   MAMMOGRAM  09/02/2024   DTaP/Tdap/Td (3 - Td or Tdap) 05/08/2028   Colonoscopy  07/13/2030   Pneumonia Vaccine 78+ Years old  Completed   DEXA SCAN  Completed   Hepatitis C Screening  Completed   Zoster Vaccines- Shingrix  Completed   HPV VACCINES  Aged Out   Meningococcal B Vaccine  Aged Out      Review of Systems Patient reports no vision/ hearing changes, adenopathy,fever, weight change,  persistant/recurrent hoarseness , swallowing issues, chest pain, palpitations, edema, persistant/recurrent cough, hemoptysis, dyspnea (rest/exertional/paroxysmal nocturnal), gastrointestinal bleeding (melena, rectal bleeding), abdominal pain, significant heartburn, bowel changes, GU symptoms (dysuria, hematuria, incontinence), Gyn symptoms (abnormal  bleeding, pain),  syncope, focal weakness, memory loss, numbness & tingling, skin/hair/nail changes, abnormal bruising or bleeding, anxiety, or depression.     Objective:   Physical Exam General Appearance:    Alert, cooperative, no distress, appears stated age  Head:    Normocephalic, without obvious abnormality, atraumatic  Eyes:    PERRL, conjunctiva/corneas clear, EOM's intact both eyes  Ears:    Normal TM's and external ear canals, both ears  Nose:   Nares normal, septum midline, mucosa normal, no drainage    or sinus tenderness  Throat:   Lips, mucosa, and tongue normal; teeth and gums normal  Neck:   Supple, symmetrical, trachea midline, no adenopathy;    Thyroid : no enlargement/tenderness/nodules   Back:     Symmetric, no curvature, ROM normal, no CVA tenderness  Lungs:     Clear to auscultation bilaterally, respirations unlabored  Chest Wall:    No tenderness or deformity   Heart:    Regular rate and rhythm, S1 and S2 normal, no murmur, rub   or gallop  Breast Exam:    Deferred to mammo  Abdomen:     Soft, non-tender, bowel sounds active all four quadrants,    no masses, no organomegaly  Genitalia:    Deferred  Rectal:    Extremities:   Extremities normal, atraumatic, no cyanosis or edema  Pulses:   2+ and symmetric all extremities  Skin:   Skin color, texture, turgor normal, no rashes or lesions  Lymph nodes:   Cervical, supraclavicular, and axillary nodes normal  Neurologic:   CNII-XII intact, normal strength, sensation and reflexes    throughout          Assessment & Plan:

## 2024-02-13 NOTE — Telephone Encounter (Signed)
 Orders was sent from [facility] and needs signatures and sent back to its designated place. 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  Please advise, Thanks

## 2024-02-13 NOTE — Assessment & Plan Note (Signed)
 Pt's PE WNL.  UTD on mammo, colonoscopy, Tdap, PNA.  Check labs.  Anticipatory guidance provided.

## 2024-02-14 LAB — LIPID PANEL
Cholesterol: 184 mg/dL (ref 0–200)
HDL: 45.7 mg/dL (ref >39.00)
LDL Cholesterol: 91 mg/dL (ref 0–99)
NonHDL: 138.47
Total CHOL/HDL Ratio: 4
Triglycerides: 238 mg/dL — ABNORMAL HIGH (ref 0.0–149.0)
VLDL: 47.6 mg/dL — ABNORMAL HIGH (ref 0.0–40.0)

## 2024-02-14 LAB — BASIC METABOLIC PANEL WITH GFR
BUN: 14 mg/dL (ref 6–23)
CO2: 26 meq/L (ref 19–32)
Calcium: 9.4 mg/dL (ref 8.4–10.5)
Chloride: 105 meq/L (ref 96–112)
Creatinine, Ser: 0.77 mg/dL (ref 0.40–1.20)
GFR: 79.84 mL/min (ref >60.00)
Glucose, Bld: 81 mg/dL (ref 70–99)
Potassium: 4.4 meq/L (ref 3.5–5.1)
Sodium: 142 meq/L (ref 135–145)

## 2024-02-14 LAB — HEPATIC FUNCTION PANEL
ALT: 19 U/L (ref 0–35)
AST: 20 U/L (ref 0–37)
Albumin: 4.5 g/dL (ref 3.5–5.2)
Alkaline Phosphatase: 59 U/L (ref 39–117)
Bilirubin, Direct: 0 mg/dL (ref 0.0–0.3)
Total Bilirubin: 0.4 mg/dL (ref 0.2–1.2)
Total Protein: 7.6 g/dL (ref 6.0–8.3)

## 2024-02-14 LAB — VITAMIN D 25 HYDROXY (VIT D DEFICIENCY, FRACTURES): VITD: 79.86 ng/mL (ref 30.00–100.00)

## 2024-02-16 ENCOUNTER — Ambulatory Visit: Payer: Self-pay | Admitting: Family Medicine

## 2024-02-18 NOTE — Telephone Encounter (Signed)
 Did not see form in folder to complete

## 2024-02-19 NOTE — Telephone Encounter (Signed)
 I do not have any forms in my folder at all and I have not recently seen any forms with her name on them

## 2024-02-20 NOTE — Telephone Encounter (Signed)
 Error until further notice.  Thanks

## 2024-02-28 DIAGNOSIS — H902 Conductive hearing loss, unspecified: Secondary | ICD-10-CM | POA: Diagnosis not present

## 2024-02-28 DIAGNOSIS — H6122 Impacted cerumen, left ear: Secondary | ICD-10-CM | POA: Diagnosis not present

## 2024-02-29 ENCOUNTER — Other Ambulatory Visit: Payer: Self-pay | Admitting: Family Medicine

## 2024-03-17 ENCOUNTER — Ambulatory Visit

## 2024-05-13 ENCOUNTER — Ambulatory Visit

## 2024-05-13 ENCOUNTER — Other Ambulatory Visit: Payer: Self-pay | Admitting: Family Medicine

## 2024-05-13 NOTE — Telephone Encounter (Signed)
 Requested Prescriptions   Pending Prescriptions Disp Refills   clonazePAM  (KLONOPIN ) 1 MG tablet [Pharmacy Med Name: CLONAZEPAM  1 MG TABLET] 90 tablet 0    Sig: TAKE 1 TABLET BY MOUTH TWICE A DAY AS NEEDED FOR ANXIETY     Date of patient request: 05/13/24 Last office visit: 02/13/2024 Upcoming visit: Visit date not found Date of last refill: 01/09/24 Last refill amount: 90

## 2024-05-18 ENCOUNTER — Ambulatory Visit

## 2024-05-23 ENCOUNTER — Other Ambulatory Visit: Payer: Self-pay | Admitting: Family Medicine

## 2024-05-30 ENCOUNTER — Other Ambulatory Visit: Payer: Self-pay | Admitting: Family Medicine

## 2024-07-09 DIAGNOSIS — R55 Syncope and collapse: Secondary | ICD-10-CM | POA: Diagnosis not present

## 2024-07-09 DIAGNOSIS — K828 Other specified diseases of gallbladder: Secondary | ICD-10-CM | POA: Diagnosis not present

## 2024-07-09 DIAGNOSIS — K8020 Calculus of gallbladder without cholecystitis without obstruction: Secondary | ICD-10-CM | POA: Diagnosis not present

## 2024-07-09 DIAGNOSIS — I951 Orthostatic hypotension: Secondary | ICD-10-CM | POA: Diagnosis not present

## 2024-07-09 DIAGNOSIS — S32018A Other fracture of first lumbar vertebra, initial encounter for closed fracture: Secondary | ICD-10-CM | POA: Diagnosis not present

## 2024-07-09 DIAGNOSIS — R42 Dizziness and giddiness: Secondary | ICD-10-CM | POA: Diagnosis not present

## 2024-07-09 DIAGNOSIS — S32000A Wedge compression fracture of unspecified lumbar vertebra, initial encounter for closed fracture: Secondary | ICD-10-CM | POA: Insufficient documentation

## 2024-07-09 DIAGNOSIS — W19XXXA Unspecified fall, initial encounter: Secondary | ICD-10-CM | POA: Diagnosis not present

## 2024-07-09 DIAGNOSIS — M545 Low back pain, unspecified: Secondary | ICD-10-CM | POA: Diagnosis not present

## 2024-07-09 DIAGNOSIS — M48061 Spinal stenosis, lumbar region without neurogenic claudication: Secondary | ICD-10-CM | POA: Diagnosis not present

## 2024-07-09 DIAGNOSIS — S32010A Wedge compression fracture of first lumbar vertebra, initial encounter for closed fracture: Secondary | ICD-10-CM | POA: Diagnosis not present

## 2024-07-10 DIAGNOSIS — S32010A Wedge compression fracture of first lumbar vertebra, initial encounter for closed fracture: Secondary | ICD-10-CM | POA: Diagnosis not present

## 2024-07-13 ENCOUNTER — Encounter: Payer: Self-pay | Admitting: Family Medicine

## 2024-07-14 DIAGNOSIS — S199XXA Unspecified injury of neck, initial encounter: Secondary | ICD-10-CM | POA: Diagnosis not present

## 2024-07-14 DIAGNOSIS — R001 Bradycardia, unspecified: Secondary | ICD-10-CM | POA: Diagnosis not present

## 2024-07-14 DIAGNOSIS — W19XXXA Unspecified fall, initial encounter: Secondary | ICD-10-CM | POA: Diagnosis not present

## 2024-07-14 DIAGNOSIS — R531 Weakness: Secondary | ICD-10-CM | POA: Diagnosis not present

## 2024-07-14 DIAGNOSIS — W19XXXD Unspecified fall, subsequent encounter: Secondary | ICD-10-CM | POA: Diagnosis not present

## 2024-07-14 DIAGNOSIS — S32010D Wedge compression fracture of first lumbar vertebra, subsequent encounter for fracture with routine healing: Secondary | ICD-10-CM | POA: Diagnosis not present

## 2024-07-14 DIAGNOSIS — R600 Localized edema: Secondary | ICD-10-CM | POA: Diagnosis not present

## 2024-07-14 DIAGNOSIS — R7989 Other specified abnormal findings of blood chemistry: Secondary | ICD-10-CM | POA: Diagnosis not present

## 2024-07-14 DIAGNOSIS — R55 Syncope and collapse: Secondary | ICD-10-CM | POA: Diagnosis not present

## 2024-07-14 DIAGNOSIS — R110 Nausea: Secondary | ICD-10-CM | POA: Diagnosis not present

## 2024-07-15 DIAGNOSIS — R600 Localized edema: Secondary | ICD-10-CM | POA: Diagnosis not present

## 2024-07-15 DIAGNOSIS — R7989 Other specified abnormal findings of blood chemistry: Secondary | ICD-10-CM | POA: Diagnosis not present

## 2024-07-15 DIAGNOSIS — R55 Syncope and collapse: Secondary | ICD-10-CM | POA: Diagnosis not present

## 2024-07-28 DIAGNOSIS — M545 Low back pain, unspecified: Secondary | ICD-10-CM | POA: Insufficient documentation

## 2024-07-28 DIAGNOSIS — S32010A Wedge compression fracture of first lumbar vertebra, initial encounter for closed fracture: Secondary | ICD-10-CM | POA: Diagnosis not present

## 2024-07-28 DIAGNOSIS — M8008XS Age-related osteoporosis with current pathological fracture, vertebra(e), sequela: Secondary | ICD-10-CM | POA: Diagnosis not present

## 2024-07-29 ENCOUNTER — Other Ambulatory Visit: Payer: Self-pay

## 2024-08-11 DIAGNOSIS — M8008XA Age-related osteoporosis with current pathological fracture, vertebra(e), initial encounter for fracture: Secondary | ICD-10-CM | POA: Diagnosis not present

## 2024-08-11 DIAGNOSIS — J45909 Unspecified asthma, uncomplicated: Secondary | ICD-10-CM | POA: Diagnosis not present

## 2024-08-16 ENCOUNTER — Encounter: Payer: Self-pay | Admitting: Family Medicine

## 2024-08-18 DIAGNOSIS — Z4889 Encounter for other specified surgical aftercare: Secondary | ICD-10-CM | POA: Diagnosis not present

## 2024-08-18 DIAGNOSIS — M545 Low back pain, unspecified: Secondary | ICD-10-CM | POA: Diagnosis not present

## 2024-08-24 ENCOUNTER — Telehealth: Payer: Self-pay

## 2024-08-24 NOTE — Telephone Encounter (Signed)
 Patient was last seen over 6 months ago. Please advise if you would like to see patient or order bone density. I dont believe patient lives in [place] according to [place] message sent on 12/7   Copied from CRM #[id number]. Topic: Clinical - Request for Lab/Test Order >> Aug 24, 2024 11:55 AM Viola F wrote: Reason for CRM: Tawni from [facility] called regarding patient fracturing her her vertebrae - she is requesting a bone density scan to be done and it's reccommended within 6 months after the fracture. Please call patient to follow up at  [phone]

## 2024-08-24 NOTE — Telephone Encounter (Unsigned)
 Copied from CRM #[id number]. Topic: Clinical - Request for Lab/Test Order >> Aug 24, 2024 11:55 AM Viola F wrote: Reason for CRM: Tawni from [facility] called regarding patient fracturing her her vertebrae - she is requesting a bone density scan to be done and it's reccommended within 6 months after the fracture. Please call patient to follow up at  [phone]

## 2024-08-30 ENCOUNTER — Other Ambulatory Visit: Payer: Self-pay | Admitting: Family Medicine

## 2024-08-31 ENCOUNTER — Telehealth: Payer: Self-pay

## 2024-08-31 NOTE — Telephone Encounter (Signed)
 Copied from CRM #[id number]. Topic: Clinical - Request for Lab/Test Order >> Aug 31, 2024  9:14 AM Vena HERO wrote: Reason for CRM: Tawni from [facility] called to follow up on her request sent on 12/15 for a bone density test. Call back number with secured voicemail is [phone]

## 2024-08-31 NOTE — Telephone Encounter (Signed)
 Phone note created on 12/15 as to the need for bone density. Patient fractured vertebrae and needs a bone density scan within 6 months of fracturing.

## 2024-09-02 ENCOUNTER — Other Ambulatory Visit: Payer: Self-pay | Admitting: Family Medicine

## 2024-09-08 ENCOUNTER — Other Ambulatory Visit (HOSPITAL_BASED_OUTPATIENT_CLINIC_OR_DEPARTMENT_OTHER): Payer: Self-pay | Admitting: Family Medicine

## 2024-09-08 DIAGNOSIS — Z1231 Encounter for screening mammogram for malignant neoplasm of breast: Secondary | ICD-10-CM

## 2024-09-08 DIAGNOSIS — Z13820 Encounter for screening for osteoporosis: Secondary | ICD-10-CM

## 2024-09-28 ENCOUNTER — Ambulatory Visit: Admitting: Family Medicine

## 2024-09-28 ENCOUNTER — Encounter: Payer: Self-pay | Admitting: Family Medicine

## 2024-09-28 VITALS — BP 126/84 | HR 77 | Ht 60.0 in | Wt 132.0 lb

## 2024-09-28 DIAGNOSIS — M8000XD Age-related osteoporosis with current pathological fracture, unspecified site, subsequent encounter for fracture with routine healing: Secondary | ICD-10-CM

## 2024-09-28 DIAGNOSIS — S32010D Wedge compression fracture of first lumbar vertebra, subsequent encounter for fracture with routine healing: Secondary | ICD-10-CM | POA: Diagnosis not present

## 2024-09-28 DIAGNOSIS — W19XXXD Unspecified fall, subsequent encounter: Secondary | ICD-10-CM

## 2024-09-28 DIAGNOSIS — E785 Hyperlipidemia, unspecified: Secondary | ICD-10-CM | POA: Diagnosis not present

## 2024-09-28 DIAGNOSIS — M810 Age-related osteoporosis without current pathological fracture: Secondary | ICD-10-CM | POA: Diagnosis not present

## 2024-09-28 DIAGNOSIS — F411 Generalized anxiety disorder: Secondary | ICD-10-CM | POA: Diagnosis not present

## 2024-09-28 DIAGNOSIS — W19XXXA Unspecified fall, initial encounter: Secondary | ICD-10-CM

## 2024-09-28 LAB — BASIC METABOLIC PANEL WITH GFR
BUN: 16 mg/dL (ref 6–23)
CO2: 28 meq/L (ref 19–32)
Calcium: 9.8 mg/dL (ref 8.4–10.5)
Chloride: 104 meq/L (ref 96–112)
Creatinine, Ser: 0.64 mg/dL (ref 0.40–1.20)
GFR: 91.07 mL/min
Glucose, Bld: 96 mg/dL (ref 70–99)
Potassium: 4.5 meq/L (ref 3.5–5.1)
Sodium: 140 meq/L (ref 135–145)

## 2024-09-28 LAB — LIPID PANEL
Cholesterol: 186 mg/dL (ref 28–200)
HDL: 53.1 mg/dL
LDL Cholesterol: 102 mg/dL — ABNORMAL HIGH (ref 10–99)
NonHDL: 133.15
Total CHOL/HDL Ratio: 4
Triglycerides: 154 mg/dL — ABNORMAL HIGH (ref 10.0–149.0)
VLDL: 30.8 mg/dL (ref 0.0–40.0)

## 2024-09-28 LAB — CBC WITH DIFFERENTIAL/PLATELET
Basophils Absolute: 0 K/uL (ref 0.0–0.1)
Basophils Relative: 0.5 % (ref 0.0–3.0)
Eosinophils Absolute: 0.1 K/uL (ref 0.0–0.7)
Eosinophils Relative: 2.4 % (ref 0.0–5.0)
HCT: 40.8 % (ref 36.0–46.0)
Hemoglobin: 13.5 g/dL (ref 12.0–15.0)
Lymphocytes Relative: 44.1 % (ref 12.0–46.0)
Lymphs Abs: 2.1 K/uL (ref 0.7–4.0)
MCHC: 33.2 g/dL (ref 30.0–36.0)
MCV: 88.3 fl (ref 78.0–100.0)
Monocytes Absolute: 0.4 K/uL (ref 0.1–1.0)
Monocytes Relative: 7.6 % (ref 3.0–12.0)
Neutro Abs: 2.1 K/uL (ref 1.4–7.7)
Neutrophils Relative %: 45.4 % (ref 43.0–77.0)
Platelets: 254 K/uL (ref 150.0–400.0)
RBC: 4.62 Mil/uL (ref 3.87–5.11)
RDW: 13.5 % (ref 11.5–15.5)
WBC: 4.7 K/uL (ref 4.0–10.5)

## 2024-09-28 LAB — TSH: TSH: 0.59 u[IU]/mL (ref 0.35–5.50)

## 2024-09-28 LAB — HEPATIC FUNCTION PANEL
ALT: 14 U/L (ref 3–35)
AST: 18 U/L (ref 5–37)
Albumin: 4.6 g/dL (ref 3.5–5.2)
Alkaline Phosphatase: 65 U/L (ref 39–117)
Bilirubin, Direct: 0.1 mg/dL (ref 0.1–0.3)
Total Bilirubin: 0.5 mg/dL (ref 0.2–1.2)
Total Protein: 7.4 g/dL (ref 6.0–8.3)

## 2024-09-28 MED ORDER — ALBUTEROL SULFATE HFA 108 (90 BASE) MCG/ACT IN AERS: 2.0000 | INHALATION_SPRAY | Freq: Four times a day (QID) | RESPIRATORY_TRACT | 0 refills | Status: AC | PRN

## 2024-09-28 MED ORDER — MONTELUKAST SODIUM 10 MG PO TABS: 10.0000 mg | ORAL_TABLET | Freq: Every evening | ORAL | 0 refills | Status: AC

## 2024-09-28 MED ORDER — PANTOPRAZOLE SODIUM 40 MG PO TBEC: 40.0000 mg | DELAYED_RELEASE_TABLET | Freq: Every day | ORAL | 1 refills | Status: AC

## 2024-09-28 MED ORDER — CLONAZEPAM 1 MG PO TABS: ORAL_TABLET | ORAL | 1 refills | Status: AC

## 2024-09-28 NOTE — Patient Instructions (Signed)
Schedule your complete physical in 6 months We'll notify you of your lab results and make any changes if needed Keep up the good work!  You look great!! Call with any questions or concerns Stay Safe!  Stay Healthy! Happy [birth year] Birthday!!

## 2024-09-28 NOTE — Progress Notes (Signed)
" ° °  Subjective:    Patient ID: Kaylee Pratt, female    DOB: [birth year], 69 y.o.   MRN: [id number]  HPI Back pain- pt is s/p kyphoplasty of L1 after a fall and reports she is feeling better than she was before surgery.  At this time, pt was told she doesn't need PT.  Is easing back into exercise at the [facility].  Still needs to be careful bending and lifting.  Due for DEXA- scheduled 10/14/24  Hyperlipidemia- chronic problem, currently on Simvastatin  20mg  daily.  Denies abd pain, N/V, CP, SOB.  Review of Systems For ROS see HPI     Objective:   Physical Exam Vitals reviewed.  Constitutional:      General: She is not in acute distress.    Appearance: Normal appearance. She is well-developed. She is not ill-appearing.  HENT:     Head: Normocephalic and atraumatic.  Eyes:     Conjunctiva/sclera: Conjunctivae normal.     Pupils: Pupils are equal, round, and reactive to light.  Neck:     Thyroid : No thyromegaly.  Cardiovascular:     Rate and Rhythm: Normal rate and regular rhythm.     Pulses: Normal pulses.     Heart sounds: Normal heart sounds. No murmur heard. Pulmonary:     Effort: Pulmonary effort is normal. No respiratory distress.     Breath sounds: Normal breath sounds.  Abdominal:     General: There is no distension.     Palpations: Abdomen is soft.     Tenderness: There is no abdominal tenderness.  Musculoskeletal:     Cervical back: Normal range of motion and neck supple.     Right lower leg: No edema.     Left lower leg: No edema.  Lymphadenopathy:     Cervical: No cervical adenopathy.  Skin:    General: Skin is warm and dry.  Neurological:     General: No focal deficit present.     Mental Status: She is alert and oriented to person, place, and time.  Psychiatric:        Mood and Affect: Mood normal.        Behavior: Behavior normal.        Thought Content: Thought content normal.           Assessment & Plan:    "

## 2024-09-29 ENCOUNTER — Ambulatory Visit: Payer: Self-pay | Admitting: Family Medicine

## 2024-09-29 NOTE — Progress Notes (Signed)
 Lab results have been discussed.   Verbalized understanding? Yes  Are there any questions? No

## 2024-10-03 NOTE — Assessment & Plan Note (Signed)
 Clonazepam  refilled

## 2024-10-03 NOTE — Assessment & Plan Note (Signed)
 S/p kyphoplasty of L1 after a fall from standing.  Was told she does not require PT at this time.  Is doing daily exercise at the [facility] but needs to be careful bending and lifting.  She has DEXA scheduled for 10/14/24.

## 2024-10-03 NOTE — Assessment & Plan Note (Signed)
 DEXA scheduled 10/14/24

## 2024-10-03 NOTE — Assessment & Plan Note (Signed)
Chronic problem.  Currently on Simvastatin 20mg daily w/o difficulty.  Check labs.  Adjust meds prn  

## 2024-10-14 ENCOUNTER — Other Ambulatory Visit (HOSPITAL_BASED_OUTPATIENT_CLINIC_OR_DEPARTMENT_OTHER)

## 2024-10-14 ENCOUNTER — Ambulatory Visit (HOSPITAL_BASED_OUTPATIENT_CLINIC_OR_DEPARTMENT_OTHER)

## 2024-11-17 ENCOUNTER — Other Ambulatory Visit (HOSPITAL_BASED_OUTPATIENT_CLINIC_OR_DEPARTMENT_OTHER)

## 2024-11-17 ENCOUNTER — Ambulatory Visit (HOSPITAL_BASED_OUTPATIENT_CLINIC_OR_DEPARTMENT_OTHER)

## 2025-02-17 ENCOUNTER — Encounter: Admitting: Family Medicine
